# Patient Record
Sex: Female | Born: 1965 | Race: Black or African American | Hispanic: No | Marital: Married | Smoking: Never smoker
Health system: Southern US, Community
[De-identification: ages and names within clinical notes are randomized; demographics above are authoritative.]

## PROBLEM LIST (undated history)

## (undated) DIAGNOSIS — I10 Essential (primary) hypertension: Secondary | ICD-10-CM

## (undated) DIAGNOSIS — N920 Excessive and frequent menstruation with regular cycle: Secondary | ICD-10-CM

## (undated) DIAGNOSIS — D219 Benign neoplasm of connective and other soft tissue, unspecified: Secondary | ICD-10-CM

## (undated) DIAGNOSIS — L309 Dermatitis, unspecified: Secondary | ICD-10-CM

## (undated) HISTORY — PX: ANKLE SURGERY: SHX546

## (undated) HISTORY — DX: Benign neoplasm of connective and other soft tissue, unspecified: D21.9

## (undated) HISTORY — PX: BACK SURGERY: SHX140

## (undated) HISTORY — DX: Excessive and frequent menstruation with regular cycle: N92.0

## (undated) HISTORY — PX: TUBAL LIGATION: SHX77

## (undated) HISTORY — PX: KNEE SURGERY: SHX244

## (undated) HISTORY — DX: Dermatitis, unspecified: L30.9

---

## 2002-02-25 ENCOUNTER — Other Ambulatory Visit: Admission: RE | Admit: 2002-02-25 | Discharge: 2002-02-25 | Payer: Self-pay | Admitting: *Deleted

## 2002-05-23 ENCOUNTER — Ambulatory Visit (HOSPITAL_COMMUNITY): Admission: RE | Admit: 2002-05-23 | Discharge: 2002-05-23 | Payer: Self-pay | Admitting: *Deleted

## 2002-05-23 ENCOUNTER — Encounter: Payer: Self-pay | Admitting: *Deleted

## 2004-12-26 ENCOUNTER — Inpatient Hospital Stay (HOSPITAL_COMMUNITY): Admission: AD | Admit: 2004-12-26 | Discharge: 2004-12-29 | Payer: Self-pay | Admitting: *Deleted

## 2004-12-27 ENCOUNTER — Encounter (INDEPENDENT_AMBULATORY_CARE_PROVIDER_SITE_OTHER): Payer: Self-pay | Admitting: *Deleted

## 2005-01-30 ENCOUNTER — Ambulatory Visit (HOSPITAL_COMMUNITY): Admission: RE | Admit: 2005-01-30 | Discharge: 2005-01-30 | Payer: Self-pay | Admitting: *Deleted

## 2005-09-02 ENCOUNTER — Encounter: Admission: RE | Admit: 2005-09-02 | Discharge: 2005-09-02 | Payer: Self-pay | Admitting: Occupational Medicine

## 2008-10-23 ENCOUNTER — Inpatient Hospital Stay (HOSPITAL_COMMUNITY): Admission: RE | Admit: 2008-10-23 | Discharge: 2008-10-24 | Payer: Self-pay | Admitting: Orthopedic Surgery

## 2008-11-02 ENCOUNTER — Ambulatory Visit: Payer: Self-pay | Admitting: Family Medicine

## 2008-11-02 DIAGNOSIS — I10 Essential (primary) hypertension: Secondary | ICD-10-CM | POA: Insufficient documentation

## 2008-11-02 DIAGNOSIS — M1710 Unilateral primary osteoarthritis, unspecified knee: Secondary | ICD-10-CM

## 2008-11-02 DIAGNOSIS — J309 Allergic rhinitis, unspecified: Secondary | ICD-10-CM | POA: Insufficient documentation

## 2008-12-05 ENCOUNTER — Ambulatory Visit: Payer: Self-pay | Admitting: Family Medicine

## 2009-01-18 ENCOUNTER — Ambulatory Visit: Payer: Self-pay | Admitting: Family Medicine

## 2009-01-18 ENCOUNTER — Encounter (INDEPENDENT_AMBULATORY_CARE_PROVIDER_SITE_OTHER): Payer: Self-pay | Admitting: Family Medicine

## 2009-01-18 ENCOUNTER — Other Ambulatory Visit: Admission: RE | Admit: 2009-01-18 | Discharge: 2009-01-18 | Payer: Self-pay | Admitting: Family Medicine

## 2009-01-22 ENCOUNTER — Encounter (INDEPENDENT_AMBULATORY_CARE_PROVIDER_SITE_OTHER): Payer: Self-pay | Admitting: *Deleted

## 2009-01-24 ENCOUNTER — Ambulatory Visit (HOSPITAL_COMMUNITY): Admission: RE | Admit: 2009-01-24 | Discharge: 2009-01-24 | Payer: Self-pay | Admitting: Family Medicine

## 2009-02-01 ENCOUNTER — Encounter (INDEPENDENT_AMBULATORY_CARE_PROVIDER_SITE_OTHER): Payer: Self-pay | Admitting: Family Medicine

## 2009-02-08 ENCOUNTER — Ambulatory Visit (HOSPITAL_COMMUNITY): Admission: RE | Admit: 2009-02-08 | Discharge: 2009-02-08 | Payer: Self-pay | Admitting: Family Medicine

## 2009-02-16 ENCOUNTER — Encounter (INDEPENDENT_AMBULATORY_CARE_PROVIDER_SITE_OTHER): Payer: Self-pay | Admitting: Family Medicine

## 2009-02-23 ENCOUNTER — Ambulatory Visit: Payer: Self-pay | Admitting: Family Medicine

## 2009-02-24 ENCOUNTER — Encounter (INDEPENDENT_AMBULATORY_CARE_PROVIDER_SITE_OTHER): Payer: Self-pay | Admitting: Family Medicine

## 2009-02-26 ENCOUNTER — Telehealth (INDEPENDENT_AMBULATORY_CARE_PROVIDER_SITE_OTHER): Payer: Self-pay | Admitting: *Deleted

## 2009-02-26 ENCOUNTER — Encounter (INDEPENDENT_AMBULATORY_CARE_PROVIDER_SITE_OTHER): Payer: Self-pay | Admitting: Family Medicine

## 2009-02-26 LAB — CONVERTED CEMR LAB
BUN: 14 mg/dL (ref 6–23)
Basophils Relative: 1 % (ref 0–1)
CO2: 21 meq/L (ref 19–32)
Cholesterol: 187 mg/dL (ref 0–200)
Creatinine, Ser: 0.78 mg/dL (ref 0.40–1.20)
Eosinophils Relative: 6 % — ABNORMAL HIGH (ref 0–5)
Glucose, Bld: 91 mg/dL (ref 70–99)
HCT: 34.8 % — ABNORMAL LOW (ref 36.0–46.0)
HDL: 73 mg/dL (ref >39)
Iron: 25 ug/dL — ABNORMAL LOW (ref 42–145)
LDL Cholesterol: 102 mg/dL — ABNORMAL HIGH (ref 0–99)
Lymphocytes Relative: 24 % (ref 12–46)
MCHC: 32.2 g/dL (ref 30.0–36.0)
MCV: 76.3 fL — ABNORMAL LOW (ref 78.0–100.0)
Monocytes Absolute: 0.5 10*3/uL (ref 0.1–1.0)
Neutro Abs: 4 10*3/uL (ref 1.7–7.7)
RBC: 4.56 M/uL (ref 3.87–5.11)
Retic Ct Pct: 0.8 % (ref 0.4–3.1)
Sodium: 138 meq/L (ref 135–145)
TSH: 6.273 microintl units/mL — ABNORMAL HIGH (ref 0.350–4.500)
UIBC: 407 ug/dL
VLDL: 12 mg/dL (ref 0–40)
WBC: 6.4 10*3/uL (ref 4.0–10.5)

## 2009-03-01 ENCOUNTER — Encounter (INDEPENDENT_AMBULATORY_CARE_PROVIDER_SITE_OTHER): Payer: Self-pay | Admitting: *Deleted

## 2009-03-06 ENCOUNTER — Ambulatory Visit: Payer: Self-pay | Admitting: Family Medicine

## 2009-03-06 DIAGNOSIS — K5900 Constipation, unspecified: Secondary | ICD-10-CM | POA: Insufficient documentation

## 2009-03-06 DIAGNOSIS — E079 Disorder of thyroid, unspecified: Secondary | ICD-10-CM | POA: Insufficient documentation

## 2009-03-06 DIAGNOSIS — D509 Iron deficiency anemia, unspecified: Secondary | ICD-10-CM

## 2009-03-06 LAB — CONVERTED CEMR LAB: Cholesterol, target level: 200 mg/dL

## 2009-03-07 ENCOUNTER — Encounter (INDEPENDENT_AMBULATORY_CARE_PROVIDER_SITE_OTHER): Payer: Self-pay | Admitting: Family Medicine

## 2009-03-07 LAB — CONVERTED CEMR LAB
Free T4: 0.86 ng/dL (ref 0.80–1.80)
T3, Total: 80.2 ng/dL (ref 80.0–204.0)
T4, Total: 7 ug/dL (ref 5.0–12.5)
TSH: 5.505 microintl units/mL — ABNORMAL HIGH (ref 0.350–4.500)

## 2009-03-09 ENCOUNTER — Encounter (INDEPENDENT_AMBULATORY_CARE_PROVIDER_SITE_OTHER): Payer: Self-pay | Admitting: *Deleted

## 2009-04-23 ENCOUNTER — Ambulatory Visit: Payer: Self-pay | Admitting: Family Medicine

## 2009-04-25 ENCOUNTER — Telehealth (INDEPENDENT_AMBULATORY_CARE_PROVIDER_SITE_OTHER): Payer: Self-pay | Admitting: Family Medicine

## 2009-05-05 ENCOUNTER — Emergency Department (HOSPITAL_COMMUNITY): Admission: EM | Admit: 2009-05-05 | Discharge: 2009-05-05 | Payer: Self-pay | Admitting: Emergency Medicine

## 2009-06-26 ENCOUNTER — Ambulatory Visit: Payer: Self-pay | Admitting: Family Medicine

## 2009-06-26 DIAGNOSIS — J45909 Unspecified asthma, uncomplicated: Secondary | ICD-10-CM | POA: Insufficient documentation

## 2009-06-26 DIAGNOSIS — R21 Rash and other nonspecific skin eruption: Secondary | ICD-10-CM | POA: Insufficient documentation

## 2009-06-27 ENCOUNTER — Encounter (INDEPENDENT_AMBULATORY_CARE_PROVIDER_SITE_OTHER): Payer: Self-pay | Admitting: Family Medicine

## 2009-06-27 LAB — CONVERTED CEMR LAB
BUN: 14 mg/dL (ref 6–23)
CO2: 23 meq/L (ref 19–32)
Calcium: 9.3 mg/dL (ref 8.4–10.5)
Chloride: 103 meq/L (ref 96–112)
Creatinine, Ser: 0.98 mg/dL (ref 0.40–1.20)
Lymphocytes Relative: 25 % (ref 12–46)
Lymphs Abs: 2 10*3/uL (ref 0.7–4.0)
MCV: 85.5 fL (ref 78.0–100.0)
Monocytes Relative: 7 % (ref 3–12)
Neutro Abs: 4.7 10*3/uL (ref 1.7–7.7)
RBC: 4.75 M/uL (ref 3.87–5.11)
RDW: 15 % (ref 11.5–15.5)

## 2009-06-28 LAB — CONVERTED CEMR LAB: Free T4: 0.94 ng/dL (ref 0.80–1.80)

## 2009-11-19 ENCOUNTER — Emergency Department (HOSPITAL_COMMUNITY): Admission: EM | Admit: 2009-11-19 | Discharge: 2009-11-19 | Payer: Self-pay | Admitting: Emergency Medicine

## 2009-11-21 ENCOUNTER — Encounter: Payer: Self-pay | Admitting: Physician Assistant

## 2009-12-31 ENCOUNTER — Ambulatory Visit: Payer: Self-pay | Admitting: Family Medicine

## 2009-12-31 DIAGNOSIS — L309 Dermatitis, unspecified: Secondary | ICD-10-CM

## 2010-01-18 ENCOUNTER — Ambulatory Visit: Payer: Self-pay | Admitting: Family Medicine

## 2010-01-18 DIAGNOSIS — M5430 Sciatica, unspecified side: Secondary | ICD-10-CM | POA: Insufficient documentation

## 2010-01-24 ENCOUNTER — Ambulatory Visit: Payer: Self-pay | Admitting: Family Medicine

## 2010-01-24 ENCOUNTER — Encounter: Payer: Self-pay | Admitting: Physician Assistant

## 2010-01-28 ENCOUNTER — Emergency Department (HOSPITAL_COMMUNITY): Admission: EM | Admit: 2010-01-28 | Discharge: 2010-01-29 | Payer: Self-pay | Admitting: Emergency Medicine

## 2010-01-28 ENCOUNTER — Telehealth (INDEPENDENT_AMBULATORY_CARE_PROVIDER_SITE_OTHER): Payer: Self-pay | Admitting: *Deleted

## 2010-01-29 ENCOUNTER — Ambulatory Visit (HOSPITAL_COMMUNITY): Admission: RE | Admit: 2010-01-29 | Discharge: 2010-01-29 | Payer: Self-pay | Admitting: Family Medicine

## 2010-01-29 ENCOUNTER — Encounter: Payer: Self-pay | Admitting: Physician Assistant

## 2010-01-29 ENCOUNTER — Ambulatory Visit: Payer: Self-pay | Admitting: Family Medicine

## 2010-01-29 DIAGNOSIS — M5126 Other intervertebral disc displacement, lumbar region: Secondary | ICD-10-CM

## 2010-01-30 ENCOUNTER — Telehealth: Payer: Self-pay | Admitting: Family Medicine

## 2010-01-30 ENCOUNTER — Encounter: Payer: Self-pay | Admitting: Physician Assistant

## 2010-02-05 ENCOUNTER — Ambulatory Visit (HOSPITAL_COMMUNITY): Admission: RE | Admit: 2010-02-05 | Discharge: 2010-02-06 | Payer: Self-pay | Admitting: Neurological Surgery

## 2010-02-13 ENCOUNTER — Encounter: Payer: Self-pay | Admitting: Physician Assistant

## 2010-02-20 ENCOUNTER — Ambulatory Visit (HOSPITAL_COMMUNITY): Admission: RE | Admit: 2010-02-20 | Discharge: 2010-02-20 | Payer: Self-pay | Admitting: Family Medicine

## 2010-02-26 ENCOUNTER — Encounter: Payer: Self-pay | Admitting: Physician Assistant

## 2010-03-04 ENCOUNTER — Telehealth: Payer: Self-pay | Admitting: Physician Assistant

## 2010-03-29 ENCOUNTER — Encounter: Payer: Self-pay | Admitting: Physician Assistant

## 2010-04-11 ENCOUNTER — Telehealth: Payer: Self-pay | Admitting: Family Medicine

## 2010-05-13 ENCOUNTER — Telehealth: Payer: Self-pay | Admitting: Family Medicine

## 2010-05-14 ENCOUNTER — Ambulatory Visit: Payer: Self-pay | Admitting: Family Medicine

## 2010-05-31 ENCOUNTER — Telehealth: Payer: Self-pay | Admitting: Family Medicine

## 2010-06-03 ENCOUNTER — Ambulatory Visit: Payer: Self-pay | Admitting: Family Medicine

## 2010-06-03 DIAGNOSIS — R05 Cough: Secondary | ICD-10-CM

## 2010-06-12 ENCOUNTER — Telehealth: Payer: Self-pay | Admitting: Family Medicine

## 2010-07-15 ENCOUNTER — Telehealth: Payer: Self-pay | Admitting: Family Medicine

## 2010-08-12 ENCOUNTER — Telehealth: Payer: Self-pay | Admitting: Family Medicine

## 2010-08-13 ENCOUNTER — Telehealth (INDEPENDENT_AMBULATORY_CARE_PROVIDER_SITE_OTHER): Payer: Self-pay | Admitting: *Deleted

## 2010-08-14 ENCOUNTER — Ambulatory Visit: Payer: Self-pay | Admitting: Family Medicine

## 2010-09-12 ENCOUNTER — Telehealth: Payer: Self-pay | Admitting: Family Medicine

## 2010-09-19 ENCOUNTER — Telehealth: Payer: Self-pay | Admitting: Family Medicine

## 2010-10-10 ENCOUNTER — Telehealth: Payer: Self-pay | Admitting: Family Medicine

## 2010-10-24 LAB — CONVERTED CEMR LAB: OCCULT 1: NEGATIVE

## 2010-10-28 ENCOUNTER — Encounter: Payer: Self-pay | Admitting: Family Medicine

## 2010-10-28 ENCOUNTER — Ambulatory Visit
Admission: RE | Admit: 2010-10-28 | Discharge: 2010-10-28 | Payer: Self-pay | Source: Home / Self Care | Attending: Family Medicine | Admitting: Family Medicine

## 2010-10-28 ENCOUNTER — Other Ambulatory Visit: Payer: Self-pay | Admitting: Family Medicine

## 2010-10-28 ENCOUNTER — Other Ambulatory Visit
Admission: RE | Admit: 2010-10-28 | Discharge: 2010-10-28 | Payer: Self-pay | Source: Home / Self Care | Admitting: Family Medicine

## 2010-10-28 ENCOUNTER — Encounter (INDEPENDENT_AMBULATORY_CARE_PROVIDER_SITE_OTHER): Payer: Self-pay

## 2010-10-28 DIAGNOSIS — J019 Acute sinusitis, unspecified: Secondary | ICD-10-CM | POA: Insufficient documentation

## 2010-10-30 ENCOUNTER — Encounter: Payer: Self-pay | Admitting: Family Medicine

## 2010-10-30 DIAGNOSIS — E039 Hypothyroidism, unspecified: Secondary | ICD-10-CM | POA: Insufficient documentation

## 2010-10-30 LAB — CONVERTED CEMR LAB
BUN: 14 mg/dL (ref 6–23)
Basophils Absolute: 0.1 10*3/uL (ref 0.0–0.1)
Basophils Relative: 1 % (ref 0–1)
Creatinine, Ser: 0.89 mg/dL (ref 0.40–1.20)
Eosinophils Absolute: 0.6 10*3/uL (ref 0.0–0.7)
Glucose, Bld: 77 mg/dL (ref 70–99)
HCT: 40.5 % (ref 36.0–46.0)
Lymphocytes Relative: 15 % (ref 12–46)
MCV: 82.8 fL (ref 78.0–100.0)
Monocytes Absolute: 0.9 10*3/uL (ref 0.1–1.0)
Monocytes Relative: 8 % (ref 3–12)
Neutrophils Relative %: 71 % (ref 43–77)
RDW: 15.2 % (ref 11.5–15.5)
Triglycerides: 69 mg/dL (ref <150)
WBC: 11 10*3/uL — ABNORMAL HIGH (ref 4.0–10.5)

## 2010-10-31 ENCOUNTER — Telehealth: Payer: Self-pay | Admitting: Family Medicine

## 2010-10-31 ENCOUNTER — Encounter: Payer: Self-pay | Admitting: Family Medicine

## 2010-11-04 ENCOUNTER — Ambulatory Visit
Admission: RE | Admit: 2010-11-04 | Discharge: 2010-11-04 | Payer: Self-pay | Source: Home / Self Care | Attending: Family Medicine | Admitting: Family Medicine

## 2010-11-04 LAB — CONVERTED CEMR LAB
Free T4: 1.02 ng/dL (ref 0.80–1.80)
T3, Free: 3 pg/mL (ref 2.3–4.2)

## 2010-11-05 ENCOUNTER — Telehealth: Payer: Self-pay | Admitting: Family Medicine

## 2010-11-05 ENCOUNTER — Encounter: Payer: Self-pay | Admitting: Family Medicine

## 2010-11-07 ENCOUNTER — Telehealth: Payer: Self-pay | Admitting: Family Medicine

## 2010-11-07 ENCOUNTER — Ambulatory Visit (HOSPITAL_COMMUNITY)
Admission: RE | Admit: 2010-11-07 | Discharge: 2010-11-07 | Payer: Self-pay | Source: Home / Self Care | Attending: Family Medicine | Admitting: Family Medicine

## 2010-11-11 ENCOUNTER — Emergency Department (HOSPITAL_COMMUNITY)
Admission: EM | Admit: 2010-11-11 | Discharge: 2010-11-12 | Payer: Self-pay | Source: Home / Self Care | Admitting: Emergency Medicine

## 2010-11-11 DIAGNOSIS — R5383 Other fatigue: Secondary | ICD-10-CM

## 2010-11-11 DIAGNOSIS — R5381 Other malaise: Secondary | ICD-10-CM | POA: Insufficient documentation

## 2010-11-11 LAB — URINALYSIS, ROUTINE W REFLEX MICROSCOPIC
Nitrite: NEGATIVE
Specific Gravity, Urine: >1.03 — ABNORMAL HIGH (ref 1.005–1.030)
Urobilinogen, UA: 0.2 mg/dL (ref 0.0–1.0)
pH: 5 (ref 5.0–8.0)

## 2010-11-11 LAB — POCT CARDIAC MARKERS
CKMB, poc: 12.3 ng/mL (ref 1.0–8.0)
Troponin i, poc: <0.05 ng/mL (ref 0.00–0.09)

## 2010-11-11 LAB — CBC
Hemoglobin: 11.4 g/dL — ABNORMAL LOW (ref 12.0–15.0)
Platelets: 267 10*3/uL (ref 150–400)
WBC: 14.6 10*3/uL — ABNORMAL HIGH (ref 4.0–10.5)

## 2010-11-11 LAB — URINE MICROSCOPIC-ADD ON

## 2010-11-11 LAB — POCT PREGNANCY, URINE: Preg Test, Ur: NEGATIVE

## 2010-11-12 ENCOUNTER — Ambulatory Visit
Admission: RE | Admit: 2010-11-12 | Discharge: 2010-11-12 | Payer: Self-pay | Source: Home / Self Care | Attending: Family Medicine | Admitting: Family Medicine

## 2010-11-12 ENCOUNTER — Encounter: Payer: Self-pay | Admitting: Family Medicine

## 2010-11-12 ENCOUNTER — Encounter (INDEPENDENT_AMBULATORY_CARE_PROVIDER_SITE_OTHER): Payer: Self-pay

## 2010-11-12 DIAGNOSIS — R079 Chest pain, unspecified: Secondary | ICD-10-CM | POA: Insufficient documentation

## 2010-11-12 DIAGNOSIS — K279 Peptic ulcer, site unspecified, unspecified as acute or chronic, without hemorrhage or perforation: Secondary | ICD-10-CM | POA: Insufficient documentation

## 2010-11-12 LAB — LIPASE, BLOOD: Lipase: 20 U/L (ref 11–59)

## 2010-11-12 LAB — COMPREHENSIVE METABOLIC PANEL
ALT: 93 U/L — ABNORMAL HIGH (ref 0–35)
Albumin: 3.3 g/dL — ABNORMAL LOW (ref 3.5–5.2)
BUN: 11 mg/dL (ref 6–23)
Chloride: 105 mEq/L (ref 96–112)
GFR calc Af Amer: >60 mL/min (ref >60)
GFR calc non Af Amer: >60 mL/min (ref >60)
Potassium: 3.2 mEq/L — ABNORMAL LOW (ref 3.5–5.1)
Sodium: 138 mEq/L (ref 135–145)
Total Protein: 6.5 g/dL (ref 6.0–8.3)

## 2010-11-12 LAB — CK TOTAL AND CKMB (NOT AT ARMC)
CK, MB: 19.4 ng/mL (ref 0.3–4.0)
CK, MB: 15.6 ng/mL (ref 0.3–4.0)
Relative Index: 5.9 — ABNORMAL HIGH (ref 0.0–2.5)
Relative Index: 6 — ABNORMAL HIGH (ref 0.0–2.5)
Total CK: 331 U/L — ABNORMAL HIGH (ref 7–177)

## 2010-11-12 LAB — TROPONIN I

## 2010-11-12 NOTE — Progress Notes (Signed)
Summary: COUGH AND INHALER  Phone Note Call from Patient   Summary of Call: CALL HER AT [phone] COUGH AND A INHALER Initial call taken by: Lind Guest,  May 31, 2010 9:07 AM  Follow-up for Phone Call        RETURNED CALL, LEFT MESSAGE Follow-up by: Adella Hare LPN,  May 31, 2010 11:47 AM    Prescriptions: VENTOLIN HFA 108 (90 BASE) MCG/ACT AERS (ALBUTEROL SULFATE) use 2 puffs every 4 hrs as needed  #2 x 0   Entered by:   Adella Hare LPN   Authorized by:   Syliva Overman MD   Signed by:   Adella Hare LPN on 16/07/9603   Method used:   Electronically to        [facility]  [address] (retail)       [address]       [place], [place]  [address]       Ph: [phone]       Fax: [phone]   RxID:   [id number]

## 2010-11-12 NOTE — Letter (Signed)
Summary: medical   release  medical   release   Imported By: Lind Guest 01/30/2010 13:13:14  _____________________________________________________________________  External Attachment:    Type:   Image     Comment:   External Document

## 2010-11-12 NOTE — Progress Notes (Signed)
Summary: MEDS  Phone Note Call from Patient   Summary of Call: OUT OF HER ALLEGERY MEDS NEEDS IT CALLED INTO [facility] CALL BACK AT [phone] AND LEAVE A MESSAGE WHEN DONE  Initial call taken by: Lind Guest,  April 11, 2010 11:15 AM    Prescriptions: CETIRIZINE HCL 10 MG TABS (CETIRIZINE HCL) one tab by mouth once daily  #30 x 0   Entered by:   Adella Hare LPN   Authorized by:   Syliva Overman MD   Signed by:   Adella Hare LPN on 60/63/0160   Method used:   Electronically to        [facility]  [address] (retail)       [address]       [place], [place]  [address]       Ph: [phone]       Fax: [phone]   RxID:   [id number]

## 2010-11-12 NOTE — Progress Notes (Signed)
Summary: refill  Phone Note Call from Patient   Summary of Call: needs a refill on allergy pilll [facility] [phone] Initial call taken by: Rudene Anda,  June 12, 2010 1:24 PM  Follow-up for Phone Call        Rx Called In Follow-up by: Adella Hare LPN,  June 13, 2010 9:09 AM    Prescriptions: CETIRIZINE HCL 10 MG TABS (CETIRIZINE HCL) one tab by mouth once daily  #30 x 0   Entered by:   Adella Hare LPN   Authorized by:   Syliva Overman MD   Signed by:   Adella Hare LPN on 14/78/2956   Method used:   Electronically to        [facility]  [address] (retail)       [address]       [place], [place]  [address]       Ph: [phone]       Fax: [phone]   RxID:   [id number]

## 2010-11-12 NOTE — Progress Notes (Signed)
Summary: FVC/ FVC REPORT  FVC/ FVC REPORT   Imported By: Lind Guest 01/23/2010 14:26:52  _____________________________________________________________________  External Attachment:    Type:   Image     Comment:   External Document

## 2010-11-12 NOTE — Progress Notes (Signed)
Summary: VANGUARD  VANGUARD   Imported By: Lind Guest 02/13/2010 11:24:14  _____________________________________________________________________  External Attachment:    Type:   Image     Comment:   External Document

## 2010-11-12 NOTE — Progress Notes (Signed)
Summary: medicine  Phone Note Call from Patient   Summary of Call: needs [facility] [place] she cancelled her appt. because of starting her period she rsc. for 1 day next week Initial call taken by: Lind Guest,  Mar 04, 2010 1:13 PM  Follow-up for Phone Call        Rx Called In Follow-up by: Adella Hare LPN,  Mar 04, 2010 1:19 PM    Prescriptions: CETIRIZINE HCL 10 MG TABS (CETIRIZINE HCL) one tab by mouth once daily  #30 x 0   Entered by:   Adella Hare LPN   Authorized by:   Esperanza Sheets PA   Signed by:   Adella Hare LPN on 40/98/1191   Method used:   Electronically to        [facility]  [address] (retail)       [address]       [place], [place]  [address]       Ph: [phone]       Fax: [phone]   RxID:   [id number]

## 2010-11-12 NOTE — Assessment & Plan Note (Signed)
Summary: NEW PATIENT   Vital Signs:  Patient profile:   45 year old female Height:      66 inches Weight:      186 pounds BMI:     30.13 O2 Sat:      99 % on Room air Pulse rate:   85 / minute Resp:     16 per minute BP sitting:   110 / 70  (left arm)  Vitals Entered By: Adella Hare LPN (December 31, 2009 2:08 PM) CC: new patient- room 1 Is Patient Diabetic? No Pain Assessment Patient in pain? no        Primary Provider:  Esperanza Sheets PA  CC:  new patient- room 1.  History of Present Illness: New pt.  Sinus infection couple wks ago.  Then had asthma attack. Was put on Prednisone, Sympbicort and Albuterol.  Not using either now.  Denies wheezing, difficulty breathing or hs syptoms.  She has a prev dx of asthma but rarely has sxs or uses albuterol. States she is very active.  Plays ball 3-4 days per wk without breathing probs.  Hx of eczema.  Has seen derm.  Uses steroid creams as needed. Hx of anemia.  Not taking any iron.  Hx of heavy menses. Usually regular, but last mos had 2 menses.  Taking her BP meds.  No complaints with.  No chest pain or palp.  Pt states she is overdue for her mamm. Last pap was 1 yr ago April.     Current Medications (verified): 1)  Amlodipine Besylate 5 Mg Tabs (Amlodipine Besylate) .... One Daily 2)  Cetirizine Hcl 10 Mg Tabs (Cetirizine Hcl) .... One Tab By Mouth Once Daily  Allergies (verified): 1)  ! Penicillin  Past History:  Past medical history reviewed for relevance to current acute and chronic problems.  Past Medical History: Reviewed history from 11/02/2008 and no changes required. Allergic rhinitis Hypertension  Review of Systems General:  Denies chills and fever. ENT:  Denies earache, nasal congestion, sinus pressure, and sore throat. CV:  Denies chest pain or discomfort and palpitations. Resp:  Denies cough, shortness of breath, and wheezing. GU:  Complains of abnormal vaginal bleeding. Derm:  Complains of  rash.  Physical Exam  General:  Well-developed,well-nourished,in no acute distress; alert,appropriate and cooperative throughout examination Head:  Normocephalic and atraumatic without obvious abnormalities. No apparent alopecia or balding. Ears:  External ear exam shows no significant lesions or deformities.  Otoscopic examination reveals clear canals, tympanic membranes are intact bilaterally without bulging, retraction, inflammation or discharge. Hearing is grossly normal bilaterally. Nose:  External nasal examination shows no deformity or inflammation. Nasal mucosa are pink and moist without lesions or exudates. Mouth:  Oral mucosa and oropharynx without lesions or exudates.  Teeth in good repair. Neck:  No deformities, masses, or tenderness noted. Lungs:  Normal respiratory effort, chest expands symmetrically. Lungs are clear to auscultation, no crackles or wheezes. Heart:  Normal rate and regular rhythm. S1 and S2 normal without gallop, murmur, click, rub or other extra sounds. Abdomen:  Bowel sounds positive,abdomen soft and non-tender without masses, organomegaly or hernias noted. Skin:  erythematous lesions noted ears, forhead & neck Cervical Nodes:  No lymphadenopathy noted Psych:  Cognition and judgment appear intact. Alert and cooperative with normal attention span and concentration. No apparent delusions, illusions, hallucinations   Impression & Recommendations:  Problem # 1:  HYPERTENSION (ICD-401.9) Assessment Improved  Her updated medication list for this problem includes:    Amlodipine Besylate  5 Mg Tabs (Amlodipine besylate) ..... One daily  Orders: T-Comprehensive Metabolic Panel [id number]) T-Lipid Profile [id number])  BP today: 110/70 Prior BP: 147/106 (06/26/2009)  Prior 10 Yr Risk Heart Disease: 3 % (06/26/2009)  Labs Reviewed: K+: 3.6 (06/27/2009) Creat: : 0.98 (06/27/2009)   Chol: 187 (02/24/2009)   HDL: 73 (02/24/2009)   LDL: 102 (02/24/2009)   TG:  58 (02/24/2009)  Problem # 2:  ANEMIA, IRON DEFICIENCY (ICD-280.9)  The following medications were removed from the medication list:    Ferrous Sulfate 325 (65 Fe) Mg Tbec (Ferrous sulfate) ..... One two times a day  Orders: T-CBC No Diff ([id number]) T-Iron [id number]) T-Iron Binding Capacity (TIBC) ([id number])  Hgb: 12.9 (06/27/2009)   Hct: 40.6 (06/27/2009)   Platelets: 326 (06/27/2009) RBC: 4.75 (06/27/2009)   RDW: 15.0 (06/27/2009)   WBC: 8.0 (06/27/2009) MCV: 85.5 (06/27/2009)   MCHC: 31.8 (06/27/2009) Retic Ct: 36.3 K/uL (02/26/2009)   Ferritin: 7 (02/26/2009) Iron: 25 (02/26/2009)   TIBC: 432 (02/26/2009)   % Sat: 6 (02/26/2009) TSH: 8.857 (06/27/2009)  Problem # 3:  ASTHMA (ICD-493.90) Assessment: Improved Hx of recent asthma attack. Has been very well controlled.  No rescue albuterol use. Has had recent PFT.  Will obtain records.  The following medications were removed from the medication list:    Ventolin Hfa 108 (90 Base) Mcg/act Aers (Albuterol sulfate) .[name]... 2 puffs every 6 hours as needed  Problem # 4:  ECZEMA (ICD-692.9)  The following medications were removed from the medication list:    Claritin 10 Mg Caps (Loratadine) ..... One daily    Triamcinolone Acetonide 0.5 % Crea (Triamcinolone acetonide) .[name]... Apply to rash two times a day for 10 days Her updated medication list for this problem includes:    Cetirizine Hcl 10 Mg Tabs (Cetirizine hcl) ..... One tab by mouth once daily  Complete Medication List: 1)  Amlodipine Besylate 5 Mg Tabs (Amlodipine besylate) .... One daily 2)  Cetirizine Hcl 10 Mg Tabs (Cetirizine hcl) .... One tab by mouth once daily  Other Orders: T-TSH [id number]) T-Vitamin D (25-Hydroxy) [id number]) T-T4, Free [id number]) Misc. Referral (Misc. Ref)  Patient Instructions: 1)  Schedule an appt in 4-6 weeks for physical/pap. 2)  It is important that you exercise regularly at least 20 minutes 5 times a week. If you develop  chest pain, have severe difficulty breathing, or feel very tired , stop exercising immediately and seek medical attention. 3)  You need to lose weight. Consider a lower calorie diet and regular exercise.  4)  Have lab work done before your next appt.

## 2010-11-12 NOTE — Progress Notes (Signed)
Summary: bak pain  Phone Note Call from Patient   Summary of Call: pt can't move her back. sharp pains in back. please call her. pt is also sweating and nausea and feet numb [phone] Initial call taken by: Rudene Anda,  January 28, 2010 4:18 PM  Follow-up for Phone Call        returned call x 2- busy signal Follow-up by: Adella Hare LPN,  January 28, 2010 5:19 PM  Additional Follow-up for Phone Call Additional follow up Details #1::        if patient calls back offer ov with dawn, tried to return call, line was busy Additional Follow-up by: Adella Hare LPN,  January 28, 2010 5:30 PM    Additional Follow-up for Phone Call Additional follow up Details #2::    COMING IN TODAY Follow-up by: Lind Guest,  January 29, 2010 8:41 AM

## 2010-11-12 NOTE — Assessment & Plan Note (Signed)
Summary: hopital follow up- room 2   Vital Signs:  Patient profile:   45 year old female Height:      66 inches Weight:      179.25 pounds BMI:     29.04 O2 Sat:      97 % on Room air Pulse rate:   91 / minute Resp:     16 per minute BP sitting:   140 / 90  (left arm)  Vitals Entered By: Adella Hare LPN (January 29, 2010 1:12 PM) CC: back pain Is Patient Diabetic? No Pain Assessment Patient in pain? yes     Location: back Intensity: 10 Type: aching Onset of pain  Constant   Primary Provider:  Esperanza Sheets PA  CC:  back pain.  History of Present Illness: Pt is here today due to increasing LBP, with radiation LLE & numbness of Lt foot.  She had an MRI earlier today.  She was at the ER last night due to increased pain that was not improved with her current prescription.  She states the Percocet that they gave her in ER worked better for her pain.  Pt also states she needs a refill of her allergy meds (Zyrtec).  She also has been having a flare up of her eczema.  She states the cream Dr Erby Pian prescribed her worked better than the creams that she has gotten from derm in the past.   Current Medications (verified): 1)  Amlodipine Besylate 5 Mg Tabs (Amlodipine Besylate) .... One Daily 2)  Cetirizine Hcl 10 Mg Tabs (Cetirizine Hcl) .... One Tab By Mouth Once Daily 3)  Naprosyn 500 Mg Tabs (Naproxen) .... Take 1 Two Times A Day As Needed For Pain 4)  Tramadol Hcl 50 Mg Tabs (Tramadol Hcl) .... Take 1 -2 Every 6 Hrs As Needed For Pain  Allergies (verified): 1)  ! Penicillin  Past History:  Past medical history reviewed for relevance to current acute and chronic problems.  Past Medical History: Reviewed history from 11/02/2008 and no changes required. Allergic rhinitis Hypertension  Review of Systems General:  Denies chills and fever. ENT:  Complains of nasal congestion. MS:  Complains of low back pain. Neuro:  Complains of numbness and tingling. Allergy:  Complains  of seasonal allergies and sneezing.  Physical Exam  General:  Well-developed,well-nourished,in no acute distress; alert,appropriate and cooperative throughout examination Head:  Normocephalic and atraumatic without obvious abnormalities. No apparent alopecia or balding. Ears:  External ear exam shows no significant lesions or deformities.  Otoscopic examination reveals clear canals, tympanic membranes are intact bilaterally without bulging, retraction, inflammation or discharge. Hearing is grossly normal bilaterally. Nose:  no external deformity, no nasal discharge, no mucosal pallor, and mucosal edema.  no external deformity, no nasal discharge, no mucosal pallor, and mucosal edema.   Mouth:  Oral mucosa and oropharynx without lesions or exudates.  Teeth in good repair. Neck:  No deformities, masses, or tenderness noted. Lungs:  Normal respiratory effort, chest expands symmetrically. Lungs are clear to auscultation, no crackles or wheezes. Heart:  Normal rate and regular rhythm. S1 and S2 normal without gallop, murmur, click, rub or other extra sounds. Msk:  TTP Lt sciatic notch.  Pt changes positions slowly due to pain.  Also noted limp with gait. Neurologic:  alert & oriented X3 and sensation intact to light touch.  alert & oriented X3 and sensation intact to light touch.   Skin:  erythematous patches noted, & areas of excoriation bilat ears, neck & forehead.  Cervical Nodes:  No lymphadenopathy noted Psych:  Cognition and judgment appear intact. Alert and cooperative with normal attention span and concentration. 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 Off work note x 2 weeks. May need to be extended by neurosurg.  Work forms completed.  Problem # 2:  SCIATICA, LEFT (ICD-724.3) Assessment: Deteriorated  Her updated medication list  for this problem includes:    Naprosyn 500 Mg Tabs (Naproxen) .[name]... Take 1 two times a day as needed for pain    Tramadol Hcl 50 Mg Tabs (Tramadol hcl) .[name]... Take 1 -2 every 6 hrs as needed for pain    Vicodin Es 7.5-750 Mg Tabs (Hydrocodone-acetaminophen) .[name]... Take 1 tab every 6 hrs as needed for back pain  Her updated medication list for this problem includes:    Naprosyn 500 Mg Tabs (Naproxen) .[name]... Take 1 two times a day as needed for pain    Tramadol Hcl 50 Mg Tabs (Tramadol hcl) .[name]... Take 1 -2 every 6 hrs as needed for pain    Vicodin Es 7.5-750 Mg Tabs (Hydrocodone-acetaminophen) .[name]... Take 1 tab every 6 hrs as needed for back pain  Problem # 3:  ALLERGIC RHINITIS (ICD-477.9) Assessment: Deteriorated  Her updated medication list for this problem includes:    Cetirizine Hcl 10 Mg Tabs (Cetirizine hcl) ..... One tab by mouth once daily  Her updated medication list for this problem includes:    Cetirizine Hcl 10 Mg Tabs (Cetirizine hcl) ..... One tab by mouth once daily  Problem # 4:  ECZEMA (ICD-692.9) Assessment: Deteriorated  Her updated medication list for this problem includes:    Cetirizine Hcl 10 Mg Tabs (Cetirizine hcl) ..... One tab by mouth once daily    Triamcinolone Acetonide 0.5 % Crea (Triamcinolone acetonide) .[name]... Apply two times a day as needed to eczema  Her updated medication list for this problem includes:    Cetirizine Hcl 10 Mg Tabs (Cetirizine hcl) ..... One tab by mouth once daily    Triamcinolone Acetonide 0.5 % Crea (Triamcinolone acetonide) .[name]... Apply two times a day as needed to eczema  Complete Medication List: 1)  Amlodipine Besylate 5 Mg Tabs (Amlodipine besylate) .... One daily 2)  Cetirizine Hcl 10 Mg Tabs (Cetirizine hcl) .... One tab by mouth once daily 3)  Naprosyn 500 Mg Tabs (Naproxen) .... Take 1 two times a day as needed for pain 4)  Tramadol Hcl 50 Mg Tabs (Tramadol hcl) .... Take 1 -2 every 6 hrs as needed for pain 5)  Vicodin Es  7.5-750 Mg Tabs (Hydrocodone-acetaminophen) .... Take 1 tab every 6 hrs as needed for back pain 6)  Triamcinolone Acetonide 0.5 % Crea (Triamcinolone acetonide) .... Apply two times a day as needed to eczema  Patient Instructions: 1)  Please schedule a follow-up appointment as needed. 2)  I have referred you to a neurosurgeon for your back pain. 3)  I have prescribed Vicoden ES for your back pain. 4)  I am taking you out of work x 2 weeks.  Hopefully you will see the neurosugeon by then. 5)  I have refilled your allergy medication & eczema cream also. Prescriptions: TRIAMCINOLONE ACETONIDE 0.5 % CREA (TRIAMCINOLONE ACETONIDE) apply two times a day as needed to eczema  #60 gm x 1   Entered and Authorized by:   Esperanza Sheets PA   Signed by:   Esperanza Sheets  PA on 01/29/2010   Method used:   Electronically to        [facility]  [address] (retail)       [address]       [place], [place]  [address]       Ph: [phone]       Fax: [phone]   RxID:   [id number] VICODIN ES 7.5-750 MG TABS (HYDROCODONE-ACETAMINOPHEN) take 1 tab every 6 hrs as needed for back pain  #60 x 0   Entered and Authorized by:   Esperanza Sheets PA   Signed by:   Esperanza Sheets PA on 01/29/2010   Method used:   Printed then faxed to ...       [facility]  [address] (retail)       [address]       [place], [place]  [address]       Ph: [phone]       Fax: [phone]   RxID:   [id number] CETIRIZINE HCL 10 MG TABS (CETIRIZINE HCL) one tab by mouth once daily  #30 x 5   Entered and Authorized by:   Esperanza Sheets PA   Signed by:   Esperanza Sheets PA on 01/29/2010   Method used:   Electronically to        [facility]  [address] (retail)       [address]       [place], [place]  [address]       Ph: [phone]       Fax: [phone]   RxID:   [id number]

## 2010-11-12 NOTE — Progress Notes (Signed)
  Phone Note Call from Patient   Caller: Patient Summary of Call: patient states she could not get allergy med until this morning, patient had to leave work early because she could not breathe, can we give her a work note for leaving early? cell [phone] Initial call taken by: Adella Hare LPN,  August 13, 2010 11:25 AM  Follow-up for Phone Call        sounds as if she needfs ov pls see if she can be workled in tomorrow, i cannot give work note without eval,  Follow-up by: Syliva Overman MD,  August 13, 2010 12:43 PM  Additional Follow-up for Phone Call Additional follow up Details #1::        LEFT MESSAGE Additional Follow-up by: Lind Guest,  August 13, 2010 1:06 PM    Additional Follow-up for Phone Call Additional follow up Details #2::    patient called back I added her to the schedule at 2. Follow-up by: Curtis Sites,  August 13, 2010 2:23 PM

## 2010-11-12 NOTE — Assessment & Plan Note (Signed)
Summary: back pain- room 2   Vital Signs:  Patient profile:   45 year old female Height:      66 inches Weight:      184.50 pounds BMI:     29.89 O2 Sat:      98 % on Room air Pulse rate:   89 / minute Resp:     16 per minute BP sitting:   120 / 80  (left arm)  Vitals Entered By: Adella Hare LPN (January 24, 2010 3:51 PM) CC: follow up back pain Is Patient Diabetic? No Pain Assessment Patient in pain? yes     Location: back Intensity: 7 Type: sharp Onset of pain  Constant Comments reports pain radiates down left leg   Primary Provider:  Esperanza Sheets PA  CC:  follow up back pain.  History of Present Illness: Pt presents today for f/u of her  LBP.  See last wks visit. She is still having pain in her LLE, Lt buttock and low back.  She states that she is very stiff & sore in the mornings when she gets out of bed.   + numbness/ tingling LLE, especially in foot. No prev hx  of low back probs.  No injury, but had been doing a lot of heavy lifting at work prior to onset. Naproxen & Tramadol have been helping.  Has been able to work.  Supervisor has been making accomodations for her so she doesn't have to do any heavy lifting, but thinks she may need a note for this to continue.    Current Medications (verified): 1)  Amlodipine Besylate 5 Mg Tabs (Amlodipine Besylate) .... One Daily 2)  Cetirizine Hcl 10 Mg Tabs (Cetirizine Hcl) .... One Tab By Mouth Once Daily 3)  Naprosyn 500 Mg Tabs (Naproxen) .... Take 1 Two Times A Day As Needed For Pain 4)  Tramadol Hcl 50 Mg Tabs (Tramadol Hcl) .... Take 1 -2 Every 6 Hrs As Needed For Pain  Allergies (verified): 1)  ! Penicillin  Past History:  Past medical history reviewed for relevance to current acute and chronic problems.  Past Medical History: Reviewed history from 11/02/2008 and no changes required. Allergic rhinitis Hypertension  Review of Systems GI:  Complains of constipation. MS:  Complains of low back pain and  stiffness; denies joint pain and joint swelling. Neuro:  Complains of numbness and tingling.  Physical Exam  General:  Well-developed,well-nourished,in no acute distress; alert,appropriate and cooperative throughout examination Head:  Normocephalic and atraumatic without obvious abnormalities. No apparent alopecia or balding. Lungs:  Normal respiratory effort, chest expands symmetrically. Lungs are clear to auscultation, no crackles or wheezes. Heart:  Normal rate and regular rhythm. S1 and S2 normal without gallop, murmur, click, rub or other extra sounds. Msk:  TTP Lt L5 S1 area, Lt sciatic notch. FROM. Able to stand heels & toes. Extremities:  Decreased strenth noted today LLE  when compared to RLE. Patellar & Achilles DTR 1/4 LLE, 2/4 RLE Neurologic:  gait normal.   Skin:  Intact without suspicious lesions or rashes Psych:  Cognition and judgment appear intact. Alert and cooperative with normal attention span and concentration. No apparent delusions, illusions, hallucinations   Impression & Recommendations:  Problem # 1:  SCIATICA, LEFT (ICD-724.3) Assessment Improved Pain is improved while on medications, but returns as wears off.  Her updated medication list for this problem includes:    Naprosyn 500 Mg Tabs (Naproxen) .[name]... Take 1 two times a day as needed for pain  Tramadol Hcl 50 Mg Tabs (Tramadol hcl) .[name]... Take 1 -2 every 6 hrs as needed for pain  Orders: Miscellaneous Other Radiology (Misc Other Rad) Misc. Referral (Misc. Ref)  Problem # 2:  HYPERTENSION (ICD-401.9) Assessment: Comment Only  Her updated medication list for this problem includes:    Amlodipine Besylate 5 Mg Tabs (Amlodipine besylate) ..... One daily  BP today: 120/80 Prior BP: 120/90 (01/18/2010)  Prior 10 Yr Risk Heart Disease: 3 % (06/26/2009)  Labs Reviewed: K+: 3.6 (06/27/2009) Creat: : 0.98 (06/27/2009)   Chol: 187 (02/24/2009)   HDL: 73 (02/24/2009)   LDL: 102 (02/24/2009)   TG: 58  (02/24/2009)  Complete Medication List: 1)  Amlodipine Besylate 5 Mg Tabs (Amlodipine besylate) .... One daily 2)  Cetirizine Hcl 10 Mg Tabs (Cetirizine hcl) .... One tab by mouth once daily 3)  Naprosyn 500 Mg Tabs (Naproxen) .... Take 1 two times a day as needed for pain 4)  Tramadol Hcl 50 Mg Tabs (Tramadol hcl) .... Take 1 -2 every 6 hrs as needed for pain  Patient Instructions: 1)  Please schedule a follow-up appointment in 1 month for pap/physical. 2)  I have refilled your Tramadol for pain. 3)  I have ordered an MRI of your Lumbar Spine & physical therapy. Prescriptions: TRAMADOL HCL 50 MG TABS (TRAMADOL HCL) take 1 -2 every 6 hrs as needed for pain  #60 x 0   Entered and Authorized by:   Esperanza Sheets PA   Signed by:   Esperanza Sheets PA on 01/24/2010   Method used:   Electronically to        [facility]  [address] (retail)       [address]       [place], [place]  [address]       Ph: [phone]       Fax: [phone]   RxID:   [id number]

## 2010-11-12 NOTE — Progress Notes (Signed)
Summary: medication  Phone Note Call from Patient   Summary of Call: patient needs her allergy medication and inhaler called into [facility] in [place]. Initial call taken by: Curtis Sites,  May 13, 2010 10:30 AM  Follow-up for Phone Call        patient needs ov Follow-up by: Adella Hare LPN,  May 13, 2010 11:51 AM  Additional Follow-up for Phone Call Additional follow up Details #1::        pt has appt to come in tomorrow and see PA. Pt aware Additional Follow-up by: Rudene Anda,  May 13, 2010 1:54 PM

## 2010-11-12 NOTE — Progress Notes (Signed)
Summary: Office Visit  Office Visit   Imported By: Lind Guest 04/05/2010 10:29:32  _____________________________________________________________________  External Attachment:    Type:   Image     Comment:   External Document

## 2010-11-12 NOTE — Letter (Signed)
Summary: work paper  work paper   Imported By: Lind Guest 01/30/2010 13:10:02  _____________________________________________________________________  External Attachment:    Type:   Image     Comment:   External Document

## 2010-11-12 NOTE — Assessment & Plan Note (Signed)
Summary: back pain- room 2   Vital Signs:  Patient profile:   45 year old female Height:      66 inches Weight:      183.25 pounds BMI:     29.68 O2 Sat:      96 % on Room air Pulse rate:   85 / minute Resp:     16 per minute BP sitting:   120 / 90  (left arm)  Vitals Entered By: Adella Hare LPN (January 18, 3085 11:33 AM) CC: back pain Is Patient Diabetic? No Pain Assessment Patient in pain? yes     Location: back Intensity: 10 Type: aching Onset of pain  Constant   Primary Provider:  Esperanza Sheets PA  CC:  back pain.  History of Present Illness: Pt presents today with c/o LBP.  She reports pain in the back of her Lt leg x approx 1 mos.  Yesterday developed pain in her lower back.  Feels like the pain shoots up from her leg to her lower back. + numbness/ tingling LLE No prev hx  of low back probs. Has been taking OTC ibuprofen.  This seemed to help initially, but since yesterday not providing much relief. No trauma.  Had been doing some heavy lifting at work.  Is now on an easier job.  She is otherwise feeling well, & is looking forward to softball season starting.     Current Medications (verified): 1)  Amlodipine Besylate 5 Mg Tabs (Amlodipine Besylate) .... One Daily 2)  Cetirizine Hcl 10 Mg Tabs (Cetirizine Hcl) .... One Tab By Mouth Once Daily  Allergies (verified): 1)  ! Penicillin  Review of Systems General:  Denies chills, fever, and weakness. CV:  Denies chest pain or discomfort. Resp:  Denies shortness of breath. GI:  Denies change in bowel habits. GU:  Denies dysuria, incontinence, urinary frequency, and urinary hesitancy. MS:  Complains of low back pain and muscle aches; denies joint pain and muscle weakness. Neuro:  Complains of numbness and tingling.  Physical Exam  General:  Well-developed,well-nourished,in no acute distress; alert,appropriate and cooperative throughout examination Head:  Normocephalic and atraumatic without obvious  abnormalities. No apparent alopecia or balding. Lungs:  Normal respiratory effort, chest expands symmetrically. Lungs are clear to auscultation, no crackles or wheezes. Heart:  Normal rate and regular rhythm. S1 and S2 normal without gallop, murmur, click, rub or other extra sounds. Msk:  + TTP Lt SI and sciatic notch.  Remainder of LS spine nontender.  Pain with slight forward flexion. + SLR Lt,  Neg Rt. Pulses:  R posterior tibial normal, R dorsalis pedis normal, L posterior tibial normal, and L dorsalis pedis normal.   Extremities:  No clubbing, cyanosis, edema, or deformity noted with normal full range of motion of all joints.   Neurologic:  alert & oriented X3, sensation intact to light touch, gait normal, and DTRs symmetrical and normal.   Skin:  Intact without suspicious lesions or rashes Psych:  Cognition and judgment appear intact. Alert and cooperative with normal attention span and concentration. No apparent delusions, illusions, hallucinations   Impression & Recommendations:  Problem # 1:  SCIATICA, LEFT (ICD-724.3) Assessment New  Consider PT if no improv over next week.  Her updated medication list for this problem includes:    Naprosyn 500 Mg Tabs (Naproxen) .[name]... Take 1 two times a day as needed for pain    Tramadol Hcl 50 Mg Tabs (Tramadol hcl) .[name]... Take 1 -2 every 6 hrs as  needed for pain  Orders: Depo- Medrol 80mg  (J1040) Ketorolac-Toradol 15mg  (V4098) Admin of Therapeutic Inj  intramuscular or subcutaneous (11914)  Problem # 2:  HYPERTENSION (ICD-401.9) Assessment: Comment Only  Her updated medication list for this problem includes:    Amlodipine Besylate 5 Mg Tabs (Amlodipine besylate) ..... One daily  BP today: 120/90 Prior BP: 110/70 (12/31/2009)  Prior 10 Yr Risk Heart Disease: 3 % (06/26/2009)  Labs Reviewed: K+: 3.6 (06/27/2009) Creat: : 0.98 (06/27/2009)   Chol: 187 (02/24/2009)   HDL: 73 (02/24/2009)   LDL: 102 (02/24/2009)   TG: 58  (02/24/2009)  Complete Medication List: 1)  Amlodipine Besylate 5 Mg Tabs (Amlodipine besylate) .... One daily 2)  Cetirizine Hcl 10 Mg Tabs (Cetirizine hcl) .... One tab by mouth once daily 3)  Naprosyn 500 Mg Tabs (Naproxen) .... Take 1 two times a day as needed for pain 4)  Tramadol Hcl 50 Mg Tabs (Tramadol hcl) .... Take 1 -2 every 6 hrs as needed for pain  Patient Instructions: 1)  Follow up appt in 1 week if no improvement. 2)  You have received a shot of Depo Medrol today to help with inflammation, and Toradol for pain. 3)  I have prescribed Naprosyn to take two times a day, and Ultram to take as needed for pain. Prescriptions: TRAMADOL HCL 50 MG TABS (TRAMADOL HCL) take 1 -2 every 6 hrs as needed for pain  #40 x 0   Entered and Authorized by:   Esperanza Sheets PA   Signed by:   Syliva Overman MD on 01/18/2010   Method used:   Electronically to        [facility]  [address] (retail)       [address]       [place], [place]  [address]       Ph: [phone]       Fax: [phone]   RxID:   [id number] NAPROSYN 500 MG TABS (NAPROXEN) take 1 two times a day as needed for pain  #40 x 0   Entered and Authorized by:   Esperanza Sheets PA   Signed by:   Syliva Overman MD on 01/18/2010   Method used:   Electronically to        [facility]  [address] (retail)       [address]       [place], [place]  [address]       Ph: [phone]       Fax: [phone]   RxID:   [id number]    Medication Administration  Injection # 1:    Medication: Depo- Medrol 80mg     Diagnosis: SCIATICA, LEFT (ICD-724.3)    Route: IM    Site: RUOQ gluteus    Exp Date: 11/11    Lot #: [id number]    Mfr: Pharmacia    Patient tolerated injection without complications    Given by: Adella Hare LPN (January 18, 2201 1:10 PM)  Injection # 2:    Medication: Ketorolac-Toradol 15mg     Diagnosis: SCIATICA, LEFT (ICD-724.3)    Route: IM    Site: LUOQ gluteus    Exp Date:  05/14/2011    Lot #: [id number]    Mfr: novaplus    Comments: toradol 60mg  given    Patient tolerated injection without complications    Given by: Adella Hare LPN (January 18, 3761 1:11 PM)  Orders Added: 1)  Est. Patient Level IV [[address]] 2)  Depo- Medrol 80mg  [J1040] 3)  Ketorolac-Toradol 15mg  [J1885] 4)  Admin of Therapeutic Inj  intramuscular or subcutaneous [[address]]

## 2010-11-12 NOTE — Progress Notes (Signed)
Summary: asthma med and allergy asap  Phone Note Call from Patient   Summary of Call: out of asthma medicine call in [facility] and allergy pills  call when done asap [phone] Initial call taken by: Lind Guest,  August 12, 2010 1:17 PM    Prescriptions: SYMBICORT 80-4.5 MCG/ACT AERO (BUDESONIDE-FORMOTEROL FUMARATE) use 2 puffs two times a day. rinse mouth and spit after each use  #1 x 1   Entered by:   Adella Hare LPN   Authorized by:   Syliva Overman MD   Signed by:   Adella Hare LPN on 48/54/6270   Method used:   Electronically to        [facility]  [address] (retail)       [address]       [place], [place]  [address]       Ph: [phone]       Fax: [phone]   RxID:   [id number] CETIRIZINE HCL 10 MG TABS (CETIRIZINE HCL) one tab by mouth once daily  #30 x 1   Entered by:   Adella Hare LPN   Authorized by:   Syliva Overman MD   Signed by:   Adella Hare LPN on 35/36/1443   Method used:   Electronically to        [facility]  [address] (retail)       [address]       [place], [place]  [address]       Ph: [phone]       Fax: [phone]   RxID:   [id number] VENTOLIN HFA 108 (90 BASE) MCG/ACT AERS (ALBUTEROL SULFATE) use 2 puffs every 4 hrs as needed  #1 x 1   Entered by:   Adella Hare LPN   Authorized by:   Syliva Overman MD   Signed by:   Adella Hare LPN on 19/37/9024   Method used:   Electronically to        [facility]  [address] (retail)       [address]       [place], [place]  [address]       Ph: [phone]       Fax: [phone]   RxID:   [id number]

## 2010-11-12 NOTE — Progress Notes (Signed)
Summary: MEDICATION  Phone Note Call from Patient   Summary of Call: PATIENT CALLED IN STATES NEED REFILL ON RX SYMBICORT 80/4.5 MG [facility] Initial call taken by: Eugenio Hoes,  September 19, 2010 1:33 PM    Prescriptions: SYMBICORT 80-4.5 MCG/ACT AERO (BUDESONIDE-FORMOTEROL FUMARATE) 2 puffs twice daily  #1 x 1   Entered by:   Adella Hare LPN   Authorized by:   Syliva Overman MD   Signed by:   Adella Hare LPN on 16/07/9603   Method used:   Faxed to ...       [facility] [address] (retail)       [address]       [place], [place]  [address]       Ph: [phone]       Fax: [phone]   RxID:   [id number]

## 2010-11-12 NOTE — Assessment & Plan Note (Signed)
Summary: cough- room 1   Vital Signs:  Patient profile:   45 year old female Height:      66 inches Weight:      191 pounds BMI:     30.94 O2 Sat:      96 % on Room air Pulse rate:   88 / minute Resp:     16 per minute BP sitting:   138 / 90  (left arm)  Vitals Entered By: Adella Hare LPN (June 03, 2010 2:07 PM) CC: dry cough Is Patient Diabetic? No Pain Assessment Patient in pain? no        Primary Provider:  Esperanza Sheets PA  CC:  dry cough.  History of Present Illness: See 05-14-10 OV.  Pt states she continues to have a nonproductive cough.  Is a little worse.  Awakening her from sleep.  Some wheezing after coughing.  Allergies doing much better now that she is back on her allergy meds.  Denies fever or chills.  No nasal congestion or drainage.  Pt states she was on Symbicort x about 1 mos back in Jan and seemed to help alot.  She has been using her albuterol inhaler as needed.     Allergies (verified): 1)  ! Penicillin  Past History:  Past medical history reviewed for relevance to current acute and chronic problems.  Past Medical History: Allergic rhinitis Hypertension Asthma  Review of Systems General:  Denies chills and fever. ENT:  Denies earache, nasal congestion, and sore throat. CV:  Denies chest pain or discomfort. Resp:  Complains of cough and wheezing; denies sputum productive. Allergy:  Complains of seasonal allergies; denies itching eyes and sneezing.  Physical Exam  General:  Well-developed,well-nourished,in no acute distress; alert,appropriate and cooperative throughout examination Head:  Normocephalic and atraumatic without obvious abnormalities. No apparent alopecia or balding. Ears:  External ear exam shows no significant lesions or deformities.  Otoscopic examination reveals clear canals, tympanic membranes are intact bilaterally without bulging, retraction, inflammation or discharge. Hearing is grossly normal bilaterally. Nose:  External  nasal examination shows no deformity or inflammation. Nasal mucosa are pink and moist without lesions or exudates. Mouth:  Oral mucosa and oropharynx without lesions or exudates.  Teeth in good repair. Neck:  No deformities, masses, or tenderness noted. Lungs:  normal respiratory effort and no intercostal retractions.  Tight bilat with faint exp wheeze.  After NMT good air exchange and CTA Heart:  Normal rate and regular rhythm. S1 and S2 normal without gallop, murmur, click, rub or other extra sounds. Cervical Nodes:  No lymphadenopathy noted Psych:  Cognition and judgment appear intact. Alert and cooperative with normal attention span and concentration. No apparent delusions, illusions, hallucinations   Impression & Recommendations:  Problem # 1:  ASTHMA (ICD-493.90) Assessment Deteriorated  Her updated medication list for this problem includes:    Ventolin Hfa 108 (90 Base) Mcg/act Aers (Albuterol sulfate) ..... Use 2 puffs every 4 hrs as needed    Prednisone 20 Mg Tabs (Prednisone) .[name]... Take 2 daily x 4 days    Symbicort 80-4.5 Mcg/act Aero (Budesonide-formoterol fumarate) ..... Use 2 puffs two times a day. rinse mouth and spit after each use  Complete Medication List: 1)  Amlodipine Besylate 5 Mg Tabs (Amlodipine besylate) .... One daily 2)  Cetirizine Hcl 10 Mg Tabs (Cetirizine hcl) .... One tab by mouth once daily 3)  Ventolin Hfa 108 (90 Base) Mcg/act Aers (Albuterol sulfate) .... Use 2 puffs every 4 hrs as needed 4)  Prednisone 20 Mg Tabs (Prednisone) .... Take 2 daily x 4 days 5)  Symbicort 80-4.5 Mcg/act Aero (Budesonide-formoterol fumarate) .... Use 2 puffs two times a day. rinse mouth and spit after each use  Other Orders: Albuterol Sulfate Sol 1mg  unit dose (Z6109) Nebulizer Tx ([address])  Patient Instructions: 1)  Keep your next appt. 2)  I have prescribed Prednisone pills to take by mouth for 4 days. 3)  I have prescribed Symbicort inhaler to use two times a day. 4)   Continue your albuterol inhaler as needed. Prescriptions: SYMBICORT 80-4.5 MCG/ACT AERO (BUDESONIDE-FORMOTEROL FUMARATE) use 2 puffs two times a day. rinse mouth and spit after each use  #1 x 1   Entered and Authorized by:   Esperanza Sheets PA   Signed by:   Esperanza Sheets PA on 06/03/2010   Method used:   Electronically to        [facility]  [address] [address] (retail)       [address]       [place], [place]  [address]       Ph: [phone]       Fax: [phone]   RxID:   [id number] PREDNISONE 20 MG TABS (PREDNISONE) take 2 daily x 4 days  #8 x 0   Entered and Authorized by:   Esperanza Sheets PA   Signed by:   Esperanza Sheets PA on 06/03/2010   Method used:   Electronically to        [facility]  [address] (retail)       [address]       [place], [place]  [address]       Ph: [phone]       Fax: [phone]   RxID:   [id number]    Medication Administration  Medication # 1:    Medication: Albuterol Sulfate Sol 1mg  unit dose    Diagnosis: COUGH (ICD-786.2)    Dose: 2.5mg     Route: inhaled    Exp Date: 8/12    Lot #: [id number]    Mfr: nephron pharm    Patient tolerated medication without complications    Given by: Adella Hare LPN (June 03, 2010 2:49 PM)  Orders Added: 1)  Albuterol Sulfate Sol 1mg  unit dose [J7613] 2)  Nebulizer Tx [94640] 3)  Est. Patient Level III [[address]]

## 2010-11-12 NOTE — Progress Notes (Signed)
Summary: medication  Phone Note Call from Patient   Summary of Call: Patient call request refill on [facility] if need to. [phone] Initial call taken by: Eugenio Hoes,  September 12, 2010 1:11 PM    Prescriptions: CETIRIZINE HCL 10 MG TABS (CETIRIZINE HCL) one tab by mouth once daily  #30 x 1   Entered by:   Adella Hare LPN   Authorized by:   Syliva Overman MD   Signed by:   Adella Hare LPN on 19/14/7829   Method used:   Electronically to        [facility]  [address] (retail)       [address]       [place], [place]  [address]       Ph: [phone]       Fax: [phone]   RxID:   [id number]

## 2010-11-12 NOTE — Progress Notes (Signed)
[facility]   Imported By: Lind Guest 03/05/2010 08:06:38  _____________________________________________________________________  External Attachment:    Type:   Image     Comment:   External Document

## 2010-11-12 NOTE — Progress Notes (Signed)
Summary: medicine  Phone Note Call from Patient   Summary of Call: [facility] Initial call taken by: Lind Guest,  July 15, 2010 9:44 AM    Prescriptions: CETIRIZINE HCL 10 MG TABS (CETIRIZINE HCL) one tab by mouth once daily  #30 x 1   Entered by:   Adella Hare LPN   Authorized by:   Syliva Overman MD   Signed by:   Adella Hare LPN on 08/65/7846   Method used:   Electronically to        [facility]  [address] (retail)       [address]       [place], [place]  [address]       Ph: [phone]       Fax: [phone]   RxID:   [id number]

## 2010-11-12 NOTE — Letter (Signed)
Summary: Out of Work  [facility]  [address]   [place], [place] [address]   Phone: [phone]  Fax: [phone]    January 29, 2010   Employee:  SHATERRA SANZONE    To Whom It May Concern:   For Medical reasons, please excuse the above named employee from work for the following dates:  Start:   01/28/10  End:   02/10/10, or as determined by Neurosurgeon.  If you need additional information, please feel free to contact our office.         Sincerely,    Esperanza Sheets PA

## 2010-11-12 NOTE — Progress Notes (Signed)
Summary: referral on back  Phone Note Call from Patient   Summary of Call: pt has appt at [facility] with PA on 01/31/2010. pt was notified and aware by neurosurgeon office.  Initial call taken by: Rudene Anda,  January 30, 2010 1:38 PM

## 2010-11-12 NOTE — Letter (Signed)
Summary: Out of Work  [facility]  [address]   [place], [place] [address]   Phone: [phone]  Fax: [phone]    August 14, 2010   Employee:  RUTHETTA KOOPMANN    To Whom It May Concern:   For Medical reasons, please excuse the above named employee from work for the following dates:  Start:   08/13/10    End:   08/14/10 to return with no restrictions  If you need additional information, please feel free to contact our office.         Sincerely,    Milus Mallick. Lodema Hong, MD

## 2010-11-12 NOTE — Letter (Signed)
Summary: Work Excuse  [facility]  [address]   [place], [place] [address]   Phone: [phone]  Fax: [phone]    Today's Date: January 24, 2010  Name of Patient: [name]  The above named patient had a medical visit today at:  3:45 pm.  Please take this into consideration when reviewing the time away from work/school.    Special Instructions:  [  ] None  [  ] To be off the remainder of today, returning to the normal work / school schedule tomorrow.  [  ] To be off until the next scheduled appointment on ______________________.  Arly.Keller  ] Other ____Work restriction of no lifting over 20 lbs, and no working over 40 hrs per week from 01-24-10 thru 02-09-10 due to medical reasons. ____________________________________________________________ ________________________________________________________________________   Sincerely yours,   Esperanza Sheets PA

## 2010-11-12 NOTE — Assessment & Plan Note (Signed)
Summary: work in per Dr. needs a dr's note/slj   Vital Signs:  Patient profile:   45 year old female Height:      66 inches Weight:      195 pounds BMI:     31.59 O2 Sat:      95 % on Room air Pulse rate:   101 / minute Pulse rhythm:   regular Resp:     16 per minute BP sitting:   130 / 90  (left arm)  Vitals Entered By: Mauricia Area CMA (August 14, 2010 2:39 PM)  Nutrition Counseling: Patient's BMI is greater than 25 and therefore counseled on weight management options.  O2 Flow:  Room air CC: follow up   Primary Care Aneliz Carbary:  Syliva Overman MD  CC:  follow up.  History of Present Illness: pt dx with asthma approx 1 year ago she had a severe attack in jan this y rhad to call the ambulance then .Yesterday she felt as though she would have another severe attack, since she was out of her reg meds for  about 1 week, due to cost. needs an fMLA. needs note work for nov 1 to return 2. Currently feeling well and back to baseline.  Denies recent fever or chills. Denies sinus pressure, nasal congestion , ear pain or sore throat. Denies chest congestion, or cough productive of sputum. Denies chest pain, palpitations, PND, orthopnea or leg swelling. Denies abdominal pain, nausea, vomitting, diarrhea or constipation. Denies change in bowel movements or bloody stool. Denies dysuria , frequency, incontinence or hesitancy. Denies  joint pain, swelling, or reduced mobility. Denies headaches, vertigo, seizures. Denies depression, anxiety or insomnia.Had back surgery earlier this yr with excellent results. Denies  rash, lesions, or itch.     Allergies (verified): 1)  ! Penicillin  Past History:  Past medical, surgical, family and social histories (including risk factors) reviewed, and no changes noted (except as noted below).  Past Medical History: Reviewed history from 06/03/2010 and no changes required. Allergic rhinitis Hypertension Asthma  Past Surgical History: L  Partial knee replacement - Oct 24, 2008 - Dr. Charlann Boxer Tubal Ligations - age 75 microdiscectomy April 2011, L5 S1 PSH reviewed for relevance  Family History: Reviewed history from 11/02/2008 and no changes required. Mom - Age 76 - HTN Dad - Age 78 - dead MI with CAD Brothers - 1 age 53 - Healthy Sisters -  60 - healthy Children - 47 year old girl - healthy  Social History: Reviewed history from 01/18/2009 and no changes required. Married Occuaption - Scientist, clinical (histocompatibility and immunogenetics) - [employer] Smoker - never ETOH use - socially Lives with spouse and daughter Highest education - high school graduate  Review of Systems      See HPI General:  Complains of fatigue; during acute asthma attack. Eyes:  Denies blurring and discharge. Resp:  Complains of cough, shortness of breath, and wheezing; denies sputum productive; recent asthma attack . Endo:  Denies cold intolerance, excessive thirst, excessive urination, and heat intolerance. Heme:  Denies abnormal bruising and bleeding. Allergy:  Denies hives or rash and itching eyes.  Physical Exam  General:  Well-developed,well-nourished,in no acute distress; alert,appropriate and cooperative throughout examination HEENT: No facial asymmetry,  EOMI, No sinus tenderness, TM's Clear, oropharynx  pink and moist.   Chest: Clear to auscultation bilaterally.  CVS: S1, S2, No murmurs, No S3.   Abd: Soft, Nontender.  MS: Adequate ROM spine, hips, shoulders and knees.  Ext: No edema.   CNS:  CN 2-12 intact, power tone and sensation normal throughout.   Skin: Intact, no visible lesions or rashes.  Psych: Good eye contact, normal affect.  Memory intact, not anxious or depressed appearing.    Impression & Recommendations:  Problem # 1:  ASTHMA (ICD-493.90) Assessment Comment Only  The following medications were removed from the medication list:    Symbicort 80-4.5 Mcg/act Aero (Budesonide-formoterol fumarate) ..... Use 2 puffs two times a day. rinse mouth and spit  after each use Her updated medication list for this problem includes:    Ventolin Hfa 108 (90 Base) Mcg/act Aers (Albuterol sulfate) ..... Use 2 puffs every 4 hrs as needed    Symbicort 80-4.5 Mcg/act Aero (Budesonide-formoterol fumarate) .[name]... 2 puffs twice daily  Problem # 2:  HERNIATED LUMBOSACRAL DISC (ICD-722.10) Assessment: Improved s/p surgery  Problem # 3:  HYPERTENSION (ICD-401.9) Assessment: Unchanged  Her updated medication list for this problem includes:    Amlodipine Besylate 5 Mg Tabs (Amlodipine besylate) ..... One daily Patient advised to follow low sodium diet rich in fruit and vegetables, and to commit to at least 30 minutes 5 days per week of regular exercise , to improve blood presure control.   Orders: T-Basic Metabolic Panel [id number])  BP today: 130/90 Prior BP: 138/90 (06/03/2010)  Prior 10 Yr Risk Heart Disease: 3 % (06/26/2009)  Labs Reviewed: K+: 3.6 (06/27/2009) Creat: : 0.98 (06/27/2009)   Chol: 187 (02/24/2009)   HDL: 73 (02/24/2009)   LDL: 102 (02/24/2009)   TG: 58 (02/24/2009)  Problem # 4:  OVERWEIGHT (ICD-278.02)  Orders: T-Lipid Profile ([id number])  Ht: 66 (08/14/2010)   Wt: 195 (08/14/2010)   BMI: 31.59 (08/14/2010) therapeutic lifestyle change discussed and encouraged  Complete Medication List: 1)  Amlodipine Besylate 5 Mg Tabs (Amlodipine besylate) .... One daily 2)  Cetirizine Hcl 10 Mg Tabs (Cetirizine hcl) .... One tab by mouth once daily 3)  Ventolin Hfa 108 (90 Base) Mcg/act Aers (Albuterol sulfate) .... Use 2 puffs every 4 hrs as needed 4)  Symbicort 80-4.5 Mcg/act Aero (Budesonide-formoterol fumarate) .... 2 puffs twice daily  Other Orders: T-CBC w/Diff [phone]) T-TSH [phone]) T-Vitamin D (25-Hydroxy) [phone])  Patient Instructions: 1)  cPE  mid January 2)    3)  It is important that you exercise regularly at least 20 minutes 5 times a week. If you develop chest pain, have severe difficulty breathing,  or feel very tired , stop exercising immediately and seek medical attention. 4)  You need to lose weight. Consider a lower calorie diet and regular exercise.  5)  BMP prior to visit, ICD-9: 6)  Lipid Panel prior to visit, ICD-9: 7)  TSH prior to visit, ICD-9:   fasting past due 8)  CBC w/ Diff prior to visit, ICD-9: 9)  vit D 10)  pls start daily spirometry. 11)  pls ensure you get  your [facility]amo Prescriptions: SYMBICORT 80-4.5 MCG/ACT AERO (BUDESONIDE-FORMOTEROL FUMARATE) 2 puffs twice daily  #1 x 3   Entered and Authorized by:   Syliva Overman MD   Signed by:   Syliva Overman MD on 08/14/2010   Method used:   Print then Give to Patient   RxID:   [id number]    Orders Added: 1)  Est. Patient Level IV [23762] 2)  T-Basic Metabolic Panel [[id number]] 3)  T-Lipid Profile [[id number]] 4)  T-CBC w/Diff [[id number]] 5)  T-TSH [[id number]] 6)  T-Vitamin D (25-Hydroxy) [[id number]]

## 2010-11-12 NOTE — Assessment & Plan Note (Signed)
Summary: allergies/asthma- room 1   Vital Signs:  Patient profile:   45 year old female Height:      66 inches Weight:      191.75 pounds BMI:     31.06 O2 Sat:      97 % on Room air Pulse rate:   94 / minute Resp:     16 per minute BP sitting:   160 / 100  (left arm)  Vitals Entered By: Adella Hare LPN (May 14, 2010 3:00 PM)  Serial Vital Signs/Assessments:  Time      Position  BP       Pulse  Resp  Temp     By                     156/100                        Esperanza Sheets PA  CC: allergies/asthma Is Patient Diabetic? No Pain Assessment Patient in pain? no        Primary Provider:  Esperanza Sheets PA  CC:  allergies/asthma.  History of Present Illness: Pt presents today with c/o flare in her allergies.  She has been out of her allergy medication for about 4 days now.  She has itchy watery eyes, sneezing,&  clear nasal mucus.  She states when her allergy syptoms flare up or she gets a cold she develops asthma syptoms too.  She has a sample of an albuterol mdi at home but no prescription.  She reports wheezing and difficulty breathing intermittently.  No HS awakening.  Before allergy flare up, no allergy syptoms.  No fever or chills.  Hx of htn.  States she took her medication about 1 hr ago.  No HA, chest pain or pressure.  Allergies (verified): 1)  ! Penicillin  Past History:  Past medical history reviewed for relevance to current acute and chronic problems.  Past Medical History: Reviewed history from 11/02/2008 and no changes required. Allergic rhinitis Hypertension  Review of Systems General:  Denies chills and fever. ENT:  Complains of nasal congestion and postnasal drainage; denies earache, sinus pressure, and sore throat. CV:  Denies chest pain or discomfort and palpitations. Resp:  Complains of cough, shortness of breath, and wheezing; denies sputum productive. Allergy:  Complains of itching eyes, seasonal allergies, and sneezing; denies hives or  rash.  Physical Exam  General:  Well-developed,well-nourished,in no acute distress; alert,appropriate and cooperative throughout examination Head:  Normocephalic and atraumatic without obvious abnormalities. No apparent alopecia or balding. Eyes:  pupils equal, pupils round, pupils reactive to light, no injection, and excessive tearing.   Ears:  External ear exam shows no significant lesions or deformities.  Otoscopic examination reveals clear canals, tympanic membranes are intact bilaterally without bulging, retraction, inflammation or discharge. Hearing is grossly normal bilaterally. Nose:  External nasal examination shows no deformity or inflammation. Nasal mucosa are pink and moist without lesions or exudates. Mouth:  Oral mucosa and oropharynx without lesions or exudates.  Teeth in good repair. Neck:  No deformities, masses, or tenderness noted. Lungs:  Normal respiratory effort, chest expands symmetrically. Lungs are clear to auscultation, no crackles or wheezes. Heart:  Normal rate and regular rhythm. S1 and S2 normal without gallop, murmur, click, rub or other extra sounds. Cervical Nodes:  No lymphadenopathy noted Psych:  Cognition and judgment appear intact. Alert and cooperative with normal attention span and concentration. No apparent delusions, illusions, hallucinations  Impression & Recommendations:  Problem # 1:  ALLERGIC RHINITIS (ICD-477.9) Assessment Deteriorated  Her updated medication list for this problem includes:    Cetirizine Hcl 10 Mg Tabs (Cetirizine hcl) ..... One tab by mouth once daily  Orders: Depo- Medrol 80mg  (J1040) Admin of Therapeutic Inj  intramuscular or subcutaneous (40981)  Problem # 2:  ASTHMA (ICD-493.90) Assessment: Deteriorated  Her updated medication list for this problem includes:    Ventolin Hfa 108 (90 Base) Mcg/act Aers (Albuterol sulfate) ..... Use 2 puffs every 4 hrs as needed  Problem # 3:  HYPERTENSION (ICD-401.9) Assessment:  Comment Only  Her updated medication list for this problem includes:    Amlodipine Besylate 5 Mg Tabs (Amlodipine besylate) ..... One daily  BP today: 160/100 Prior BP: 140/90 (01/29/2010)  Prior 10 Yr Risk Heart Disease: 3 % (06/26/2009)  Labs Reviewed: K+: 3.6 (06/27/2009) Creat: : 0.98 (06/27/2009)   Chol: 187 (02/24/2009)   HDL: 73 (02/24/2009)   LDL: 102 (02/24/2009)   TG: 58 (02/24/2009)  Complete Medication List: 1)  Amlodipine Besylate 5 Mg Tabs (Amlodipine besylate) .... One daily 2)  Cetirizine Hcl 10 Mg Tabs (Cetirizine hcl) .... One tab by mouth once daily 3)  Ventolin Hfa 108 (90 Base) Mcg/act Aers (Albuterol sulfate) .... Use 2 puffs every 4 hrs as needed  Patient Instructions: 1)  Please schedule a follow-up appointment in 1 month for pap and follow up blood pressure. 2)  You have received a shot of Depo Medrol today. 3)  I have  refilled your allergy medicine and prescribed an inhaler for you. Prescriptions: VENTOLIN HFA 108 (90 BASE) MCG/ACT AERS (ALBUTEROL SULFATE) use 2 puffs every 4 hrs as needed  #2 x 2   Entered and Authorized by:   Esperanza Sheets PA   Signed by:   Esperanza Sheets PA on 05/14/2010   Method used:   Electronically to        [facility]  [address] (retail)       [address]       [place], [place]  [address]       Ph: [phone]       Fax: [phone]   RxID:   [id number] CETIRIZINE HCL 10 MG TABS (CETIRIZINE HCL) one tab by mouth once daily  #30 x 5   Entered and Authorized by:   Esperanza Sheets PA   Signed by:   Esperanza Sheets PA on 05/14/2010   Method used:   Electronically to        [facility]  [address] (retail)       [address]       [place], [place]  [address]       Ph: [phone]       Fax: [phone]   RxID:   [id number]    Medication Administration  Injection # 1:    Medication: Depo- Medrol 80mg     Diagnosis: ALLERGIC RHINITIS (ICD-477.9)    Route: IM    Site: RUOQ gluteus     Exp Date: 5/12    Lot #: [id number]    Mfr: [employer]    Patient tolerated injection without complications    Given by: Adella Hare LPN (May 14, 2010 3:48 PM)  Orders Added: 1)  Depo- Medrol 80mg  [J1040] 2)  Admin of Therapeutic Inj  intramuscular or subcutaneous [96372] 3)  Est. Patient Level IV [55732]

## 2010-11-13 ENCOUNTER — Encounter: Payer: Self-pay | Admitting: Family Medicine

## 2010-11-14 NOTE — Letter (Signed)
Summary: Out of Work  [facility]  [address]   [place], [place] [address]   Phone: [phone]  Fax: [phone]    October 28, 2010   Employee:  SAYRE MAZOR    To Whom It May Concern:   For Medical reasons, please excuse the above named employee from work for the following dates:  Start:   10/28/2010  End:   11/07/2010 To return without restrictions  If you need additional information, please feel free to contact our office.         Sincerely,    Milus Mallick. Lodema Hong, M.D.

## 2010-11-14 NOTE — Assessment & Plan Note (Signed)
Summary: phy   Vital Signs:  Patient profile:   45 year old female Menstrual status:  irregular LMP:     10/21/2010 Height:      66 inches Weight:      192.25 pounds BMI:     31.14 O2 Sat:      98 % Pulse rate:   109 / minute Pulse rhythm:   regular Resp:     16 per minute BP sitting:   160 / 102  (left arm) Cuff size:   regular  Vitals Entered By: Everitt Amber LPN (October 28, 2010 10:00 AM)  Nutrition Counseling: Patient's BMI is greater than 25 and therefore counseled on weight management options. CC: broken out really bad with her eczema and also has some sinus congestion   Vision Screening:Left eye w/o correction: 20 / 70 Right Eye w/o correction: 20 / 30 Both eyes w/o correction:  20/ 30       Vision Comments: left glasses in the car  Vision Entered By: Everitt Amber LPN (October 28, 2010 10:12 AM) LMP (date): 10/21/2010     Menstrual Status irregular Enter LMP: 10/21/2010 Last PAP Result NEGATIVE FOR INTRAEPITHELIAL LESIONS OR MALIGNANCY.   Primary Care Provider:  Syliva Overman MD  CC:  broken out really bad with her eczema and also has some sinus congestion .  History of Present Illness: Reports  that she has been feeling ill forthe past 2 weeks.She has had inc sinus pressure and drainage, and her aczema is totally out of control. she also c/o excessive fatigue.  Denies chest pain, palpitations, PND, orthopnea or leg swelling. Denies abdominal pain, nausea, vomitting, diarrhea or constipation. Denies change in bowel movements or bloody stool. Denies dysuria , frequency, incontinence or hesitancy. Denies  joint pain, swelling, or reduced mobility. Denies headaches, vertigo, seizures. Denies depression, anxiety or insomnia.     Current Medications (verified): 1)  Amlodipine Besylate 5 Mg Tabs (Amlodipine Besylate) .... One Daily 2)  Cetirizine Hcl 10 Mg Tabs (Cetirizine Hcl) .... One Tab By Mouth Once Daily 3)  Ventolin Hfa 108 (90 Base) Mcg/act  Aers (Albuterol Sulfate) .... Use 2 Puffs Every 4 Hrs As Needed 4)  Symbicort 80-4.5 Mcg/act Aero (Budesonide-Formoterol Fumarate) .... 2 Puffs Twice Daily  Allergies (verified): 1)  ! Penicillin  Review of Systems      See HPI General:  Complains of fatigue. Eyes:  Complains of blurring and discharge. ENT:  Complains of postnasal drainage and sinus pressure; frontal and maxillary sinus pressure with green drainagex 2 weeks. CV:  Complains of chest pain or discomfort; intermittent chest discomfort x 4 months. Resp:  Complains of wheezing; intermittent wheezing, improved on preventive med. Derm:  Complains of rash; uncontrolled eczema x 2 weeks, involving arms and chest. Endo:  Denies cold intolerance, excessive hunger, excessive thirst, excessive urination, and heat intolerance. Heme:  Denies abnormal bruising and bleeding. Allergy:  Complains of seasonal allergies.  Physical Exam  General:  Well-developed,well-nourished,in no acute distress; alert,appropriate and cooperative throughout examination Head:  Normocephalic and atraumatic without obvious abnormalities. No apparent alopecia or balding. Eyes:  vision grossly intact, pupils equal, pupils round, and pupils reactive to light.   Ears:  External ear exam shows no significant lesions or deformities.  Otoscopic examination reveals clear canals, tympanic membranes are intact bilaterally without bulging, retraction, inflammation or discharge. Hearing is grossly normal bilaterally. Nose:  External nasal examination shows no deformity or inflammation. Nasal mucosa are pink and moist without lesions or exudates.frontal and maxillary  sinus tenderness Mouth:  Oral mucosa and oropharynx without lesions or exudates.  Teeth in good repair. Neck:  No deformities, masses, or tenderness noted. Chest Wall:  No deformities, masses, or tenderness noted. Breasts:  No mass, nodules, thickening, tenderness, bulging, retraction, inflamation, nipple  discharge or skin changes noted.   Lungs:  Normal respiratory effort, chest expands symmetrically. Lungs are clear to auscultation, no crackles or wheezes. Heart:  Normal rate and regular rhythm. S1 and S2 normal without gallop, murmur, click, rub or other extra sounds. Abdomen:  Bowel sounds positive,abdomen soft and  without masses, organomegaly or hernias noted.Epigastric tenderness Rectal:  No external abnormalities noted. Normal sphincter tone. No rectal masses or tenderness. Genitalia:  Normal introitus for age, no external lesions, no vaginal discharge, mucosa pink and moist, no vaginal or cervical lesions, no vaginal atrophy, no friaility or hemorrhage, normal uterus size and position, no adnexal masses or tenderness Msk:  No deformity or scoliosis noted of thoracic or lumbar spine.   Pulses:  R and L carotid,radial,femoral,dorsalis pedis and posterior tibial pulses are full and equal bilaterally Extremities:  No clubbing, cyanosis, edema, or deformity noted with normal full range of motion of all joints.   Neurologic:  No cranial nerve deficits noted. Station and gait are normal. Plantar reflexes are down-going bilaterally. DTRs are symmetrical throughout. Sensory, motor and coordinative functions appear intact. Skin:  severe eczema affecting upper extremeties and trunk Cervical Nodes:  No lymphadenopathy noted Axillary Nodes:  No palpable lymphadenopathy Inguinal Nodes:  No significant adenopathy Psych:  Cognition and judgment appear intact. Alert and cooperative with normal attention span and concentration. No apparent delusions, illusions, hallucinations   Impression & Recommendations:  Problem # 1:  ACUTE SINUSITIS, UNSPECIFIED (ICD-461.9) Assessment Comment Only  The following medications were removed from the medication list:    Septra Ds 800-160 Mg Tabs (Sulfamethoxazole-trimethoprim) .[name]... Take 1 tablet by mouth two times a day Her updated medication list for this problem  includes:    Tetracycline Hcl 500 Mg Caps (Tetracycline hcl) .[name]... Take 1 capsule by mouth four times a day    Metronidazole 500 Mg Tabs (Metronidazole) .[name]... Take 1 tablet by mouth three times a day  Problem # 2:  THYROID FUNCTION TEST, ABNORMAL (ICD-794.5) Assessment: Comment Only  Future Orders: Radiology other (Radiology Other) ... 11/04/2010  Problem # 3:  ASTHMA (ICD-493.90) Assessment: Improved  Her updated medication list for this problem includes:    Ventolin Hfa 108 (90 Base) Mcg/act Aers (Albuterol sulfate) ..... Use 2 puffs every 4 hrs as needed    Symbicort 80-4.5 Mcg/act Aero (Budesonide-formoterol fumarate) .[name]... 2 puffs twice daily  Problem # 4:  OVERWEIGHT (ICD-278.02) Assessment: Unchanged  Ht: 66 (10/28/2010)   Wt: 192.25 (10/28/2010)   BMI: 31.14 (10/28/2010) therapeutic lifestyle change discussed and encouraged  Problem # 5:  HYPERTENSION (ICD-401.9) Assessment: Deteriorated  The following medications were removed from the medication list:    Amlodipine Besylate 5 Mg Tabs (Amlodipine besylate) ..... One daily Her updated medication list for this problem includes:    Amlodipine Besylate 10 Mg Tabs (Amlodipine besylate) .[name]... Take 1 tablet by mouth once a day pt may take two 5mg  daily till done, effective 10/28/2010  Orders: EKG w/ Interpretation (93000)nSR, no ischemia or LVH  BP today: 160/102 Prior BP: 130/90 (08/14/2010)  Prior 10 Yr Risk Heart Disease: 3 % (06/26/2009)  Labs Reviewed: K+: 3.6 (06/27/2009) Creat: : 0.98 (06/27/2009)   Chol: 187 (02/24/2009)   HDL: 73 (02/24/2009)   LDL:  102 (02/24/2009)   TG: 58 (02/24/2009)  Problem # 6:  ECZEMA (ICD-692.9) Assessment: Deteriorated  Her updated medication list for this problem includes:    Cetirizine Hcl 10 Mg Tabs (Cetirizine hcl) ..... One tab by mouth once daily    Triamcinolone Acetonide 0.1 % Crea (Triamcinolone acetonide) .[name]... Apply twice daily as needed for rash, do not use on the  face  Complete Medication List: 1)  Cetirizine Hcl 10 Mg Tabs (Cetirizine hcl) .... One tab by mouth once daily 2)  Ventolin Hfa 108 (90 Base) Mcg/act Aers (Albuterol sulfate) .... Use 2 puffs every 4 hrs as needed 3)  Symbicort 80-4.5 Mcg/act Aero (Budesonide-formoterol fumarate) .... 2 puffs twice daily 4)  Amlodipine Besylate 10 Mg Tabs (Amlodipine besylate) .... Take 1 tablet by mouth once a day pt may take two 5mg  daily till done, effective 10/28/2010 5)  Triamcinolone Acetonide 0.1 % Crea (Triamcinolone acetonide) .... Apply twice daily as needed for rash, do not use on the face 6)  Prilosec 20 Mg Cpdr (Omeprazole) .... Take 1 capsule by mouth once a day 7)  Tetracycline Hcl 500 Mg Caps (Tetracycline hcl) .... Take 1 capsule by mouth four times a day 8)  Metronidazole 500 Mg Tabs (Metronidazole) .... Take 1 tablet by mouth three times a day 9)  Omeprazole 20 Mg Cpdr (Omeprazole) .... Take 1 capsule by mouth two times a day for 2 weeks , while on antibiotics 10)  Synthroid 25 Mcg Tabs (Levothyroxine sodium) .... Take 1 tablet by mouth once a day  Other Orders: TLB-H. Pylori Abs(Helicobacter Pylori) (86677-HELICO) Hemoccult Guaiac-1 spec.(in office) (82270) Pap Smear ([address]403)  Patient Instructions: 1)  Nurse bP check in 1 week. 2)  MD f/u in 3.5 months 3)  Work excuse from 10/28/2010 to return 11/05/2010. 4)  Dose increase on amlodipine as discuseed. 5)  You are being treated for sinusitis, uncontrolled hypertension and eczema, meds are sent in as discused. 6)  fasting labs today 7)  It is important that you exercise regularly at least 20 minutes 5 times a week. If you develop chest pain, have severe difficulty breathing, or feel very tired , stop exercising immediately and seek medical attention. 8)  You need to lose weight. Consider a lower calorie diet and regular exercise.  Prescriptions: SYNTHROID 25 MCG TABS (LEVOTHYROXINE SODIUM) Take 1 tablet by mouth once a day  #30 x 3    Entered and Authorized by:   Syliva Overman MD   Signed by:   Syliva Overman MD on 11/09/2010   Method used:   Historical   RxID:   [id number] OMEPRAZOLE 20 MG CPDR (OMEPRAZOLE) Take 1 capsule by mouth two times a day for 2 weeks , while on antibiotics  #28 x 0   Entered and Authorized by:   Syliva Overman MD   Signed by:   Syliva Overman MD on 10/30/2010   Method used:   Historical   RxID:   [id number] METRONIDAZOLE 500 MG TABS (METRONIDAZOLE) Take 1 tablet by mouth three times a day  #42 x 0   Entered and Authorized by:   Syliva Overman MD   Signed by:   Syliva Overman MD on 10/30/2010   Method used:   Historical   RxID:   [id number] TETRACYCLINE HCL 500 MG CAPS (TETRACYCLINE HCL) Take 1 capsule by mouth four times a day  #56 x 0   Entered and Authorized by:   Syliva Overman MD   Signed by:   Syliva Overman MD  on 10/30/2010   Method used:   Historical   RxID:   [id number] SYMBICORT 80-4.5 MCG/ACT AERO (BUDESONIDE-FORMOTEROL FUMARATE) 2 puffs twice daily  #2 x 0   Entered by:   Everitt Amber LPN   Authorized by:   Syliva Overman MD   Signed by:   Everitt Amber LPN on 14/78/2956   Method used:   Samples Given   RxID:   [id number] PRILOSEC 20 MG CPDR (OMEPRAZOLE) Take 1 capsule by mouth once a day  #30 x 3   Entered and Authorized by:   Syliva Overman MD   Signed by:   Syliva Overman MD on 10/28/2010   Method used:   Electronically to        [facility]  [address] [facility] (retail)       [address]       [place], [place]  [address]       Ph: [phone]       Fax: [phone]   RxID:   [id number] SEPTRA DS 800-160 MG TABS (SULFAMETHOXAZOLE-TRIMETHOPRIM) Take 1 tablet by mouth two times a day  #20 x 0   Entered and Authorized by:   Syliva Overman MD   Signed by:   Syliva Overman MD on 10/28/2010   Method used:   Electronically to        [facility]  [address] [facility] (retail)       [address]       [place], [place]  [address]       Ph: [phone]       Fax: [phone]   RxID:   [id number] PREDNISONE (PAK) 5 MG TABS (PREDNISONE) Use as directed  #21 x 0   Entered and Authorized by:   Syliva Overman MD   Signed by:   Syliva Overman MD on 10/28/2010   Method used:   Electronically to        [facility]  [address] [facility] (retail)       [address]       [place], [place]  [address]       Ph: [phone]       Fax: [phone]   RxID:   [id number] TRIAMCINOLONE ACETONIDE 0.1 % CREA (TRIAMCINOLONE ACETONIDE) apply twice daily as needed for rash, do not use on the face  #45gm x 1   Entered and Authorized by:   Syliva Overman MD   Signed by:   Syliva Overman MD on 10/28/2010   Method used:   Print then Give to Patient   RxID:   [id number] AMLODIPINE BESYLATE 10 MG TABS (AMLODIPINE BESYLATE) Take 1 tablet by mouth once a day pt may take tWO 5mg  daily till done, effective 10/28/2010  #30 x 3   Entered and Authorized by:   Syliva Overman MD   Signed by:   Syliva Overman MD on 10/28/2010   Method used:   Printed then faxed to ...       [facility] [address] (retail)       [address]       [place], [place]  [address]       Ph: [phone]       Fax: [phone]   RxID:   [id number]  lot [phone] exp. 12/2011 Symbicort 80/4.5  Orders Added: 1)  TLB-H. Pylori Abs(Helicobacter Pylori) [86677-HELICO] 2)  Est. Patient 40-64 years [phone]  3)  EKG w/ Interpretation [93000] 4)  Hemoccult Guaiac-1 spec.(in office) [82270] 5)  Pap Smear [88150] 6)  Radiology other [Radiology Other]    Laboratory Results  Date/Time Received: October 28, 2010 11:14 AM  Date/Time Reported: October 28, 2010 11:14 AM   Stool - Occult Blood Hemmoccult #1: negative Date: 10/24/2010 Comments: 5030 5/14 51301 13L 10/13 Adella Hare LPN  October 28, 2010 11:15 AM   symbicort 80/4.5 samples given x2 [id number] D00 3/13

## 2010-11-14 NOTE — Letter (Signed)
Summary: LAB ADD ON  LAB ADD ON   Imported By: Lind Guest 10/31/2010 08:45:36  _____________________________________________________________________  External Attachment:    Type:   Image     Comment:   External Document

## 2010-11-14 NOTE — Assessment & Plan Note (Signed)
Summary: B P CK  Nurse Visit   Vital Signs:  Patient profile:   45 year old female Height:      66 inches Weight:      196 pounds BP sitting:   110 / 90  (left arm)  Vitals Entered By: Everitt Amber LPN (November 04, 2010 11:32 AM) CC: In for BP check    Allergies: 1)  ! Penicillin rechecked and agree, pt to continue current BP meds

## 2010-11-14 NOTE — Progress Notes (Signed)
Summary: paper  Phone Note Call from Patient   Summary of Call: have you finished her paper needs it faxed back by tomorrow   fax at  [phone] Initial call taken by: Lind Guest,  November 05, 2010 9:14 AM  Follow-up for Phone Call        completed, pls fax and let her know Follow-up by: Syliva Overman MD,  November 05, 2010 5:22 PM  Additional Follow-up for Phone Call Additional follow up Details #1::        faxed and patient aware Additional Follow-up by: Lind Guest,  November 06, 2010 8:44 AM

## 2010-11-14 NOTE — Progress Notes (Signed)
Summary: medicine  Phone Note Call from Patient   Summary of Call: said the nurse called her about her labs and was supposed to send in another antibiotic and has not received it yet just wanted to let you know please call in   left message Initial call taken by: Lind Guest,  October 31, 2010 7:47 AM  Follow-up for Phone Call        meds sent in again and called to confirm pharmacy recieved. Patient aware Follow-up by: Everitt Amber LPN,  October 31, 2010 9:08 AM    Prescriptions: TETRACYCLINE HCL 500 MG CAPS (TETRACYCLINE HCL) Take 1 capsule by mouth four times a day  #56 x 0   Entered by:   Everitt Amber LPN   Authorized by:   Syliva Overman MD   Signed by:   Everitt Amber LPN on 16/07/9603   Method used:   Electronically to        [facility]  [address] (retail)       [address]       [place], [place]  [address]       Ph: [phone]       Fax: [phone]   RxID:   [id number] METRONIDAZOLE 500 MG TABS (METRONIDAZOLE) Take 1 tablet by mouth three times a day  #42 x 0   Entered by:   Everitt Amber LPN   Authorized by:   Syliva Overman MD   Signed by:   Everitt Amber LPN on 08/09/2535   Method used:   Electronically to        [facility]  [address] (retail)       [address]       [place], [place]  [address]       Ph: [phone]       Fax: [phone]   RxID:   [id number] OMEPRAZOLE 20 MG CPDR (OMEPRAZOLE) Take 1 capsule by mouth two times a day for 2 weeks , while on antibiotics  #28 x 0   Entered by:   Everitt Amber LPN   Authorized by:   Syliva Overman MD   Signed by:   Everitt Amber LPN on 32/35/5732   Method used:   Electronically to        [facility]  [address] (retail)       [address]       [place], [place]  [address]       Ph: [phone]       Fax: [phone]   RxID:   [id number]

## 2010-11-14 NOTE — Progress Notes (Signed)
Summary: reaction  Phone Note Call from Patient   Summary of Call: pt got stomach medicine pills. face got red and thinks she may have had a reaction. pt okay now but face still red.  [phone] Initial call taken by: Rudene Anda,  November 07, 2010 8:39 AM  Follow-up for Phone Call        Does patient need to stop med? Follow-up by: Everitt Amber LPN,  November 07, 2010 9:48 AM  Additional Follow-up for Phone Call Additional follow up Details #1::        advise she needs to stop the meds for 2 to 3 days, if face nl restart  one abiotic do the flagyll alone for 2 days, see if any reaction, then if not do the tetracycline alng with the flagyll Additional Follow-up by: Syliva Overman MD,  November 07, 2010 12:12 PM    Additional Follow-up for Phone Call Additional follow up Details #2::    She took the meds monday evening and then tuesday morning and thats when she noticed the redness in her face. Advised her to stop all meds for 2-3 days and then start back taking the flagyl for 2 days, if no reaction then take the tetracycline with the flagyl and see if there is a reaction then. Patient aware Follow-up by: Everitt Amber LPN,  November 07, 2010 12:20 PM

## 2010-11-14 NOTE — Progress Notes (Signed)
Summary: fyi/ medicine  Phone Note Call from Patient   Summary of Call: she going to have the pharmacy to send over a request for the medicine for her exzema and she had to use it on her face ,back and arms and she needs some more it is working for her Initial call taken by: Lind Guest,  October 31, 2010 1:56 PM  Follow-up for Phone Call        noted Follow-up by: Adella Hare LPN,  October 31, 2010 2:48 PM

## 2010-11-14 NOTE — Letter (Signed)
Summary: Letter  Letter   Imported By: Lind Guest 11/05/2010 10:48:19  _____________________________________________________________________  External Attachment:    Type:   Image     Comment:   External Document

## 2010-11-14 NOTE — Progress Notes (Signed)
Summary: medication  Phone Note Call from Patient   Summary of Call: patient needs her amlodipine 5 mg called into [facility] for 3 month supply and her Certizine 10mg  called into [facility], patient was advised from now on to please call pharmacy and have them fax a request. Initial call taken by: Curtis Sites,  October 10, 2010 10:15 AM    Prescriptions: CETIRIZINE HCL 10 MG TABS (CETIRIZINE HCL) one tab by mouth once daily  #30 x 0   Entered by:   Adella Hare LPN   Authorized by:   Syliva Overman MD   Signed by:   Adella Hare LPN on 11/91/4782   Method used:   Electronically to        [facility]  [address] (retail)       [address]       [place], [place]  [address]       Ph: [phone]       Fax: [phone]   RxID:   [id number] AMLODIPINE BESYLATE 5 MG TABS (AMLODIPINE BESYLATE) One daily  #90 Tablet x 0   Entered by:   Adella Hare LPN   Authorized by:   Syliva Overman MD   Signed by:   Adella Hare LPN on 47/42/5956   Method used:   Faxed to ...       [facility] [address] (retail)       [address]       [place], [place]  [address]       Ph: [phone]       Fax: [phone]   RxID:   [id number]

## 2010-11-14 NOTE — Letter (Signed)
Summary: Out of Work  [facility]  [address]   [place], [place] [address]   Phone: [phone]  Fax: [phone]    October 28, 2010   Employee:  OPEL LEJEUNE    To Whom It May Concern:   For Medical reasons, please excuse the above named employee from work for the following dates:  Start:   10/28/2010  End:   11/05/2010 To return without restrictions  If you need additional information, please feel free to contact our office.         Sincerely,    Milus Mallick. Lodema Hong, M.D.

## 2010-11-14 NOTE — Letter (Signed)
Summary: dose increase  dose increase   Imported By: Lind Guest 10/29/2010 11:21:11  _____________________________________________________________________  External Attachment:    Type:   Image     Comment:   External Document

## 2010-11-19 ENCOUNTER — Ambulatory Visit: Payer: Managed Care, Other (non HMO) | Admitting: Family Medicine

## 2010-11-19 ENCOUNTER — Encounter (INDEPENDENT_AMBULATORY_CARE_PROVIDER_SITE_OTHER): Payer: Self-pay

## 2010-11-20 NOTE — Letter (Signed)
Summary: take home instructions from ed  take home instructions from ed   Imported By: Luann Bullins 11/12/2010 13:45:08  _____________________________________________________________________  External Attachment:    Type:   Image     Comment:   External Document

## 2010-11-20 NOTE — Letter (Signed)
Summary: Out of Work  [facility]  [address]   [place], [place] [address]   Phone: [phone]  Fax: [phone]    November 12, 2010   Employee:  KETINA MARS    To Whom It May Concern:   For Medical reasons, please excuse the above named employee from work for the following dates:  Start:    End:    If you need additional information, please feel free to contact our office.         Sincerely,    Everitt Amber LPN

## 2010-11-20 NOTE — Assessment & Plan Note (Signed)
Summary: office visit   Vital Signs:  Patient profile:   45 year old female Menstrual status:  irregular Height:      66 inches Weight:      198.25 pounds BMI:     32.11 O2 Sat:      97 % on Room air Pulse rate:   96 / minute Resp:     16 per minute BP sitting:   140 / 96  (left arm)  Vitals Entered By: Adella Hare LPN (November 12, 2010 9:37 AM)  O2 Flow:  Room air CC: er follow up Is Patient Diabetic? No Comments didnt bring meds to ov but states list is accurate   Primary Care Provider:  Syliva Overman MD  CC:  er follow up.  History of Present Illness: 3 month h/o in termittent chest pain, not necessarily associated with actuivity, non radiating, sometimes gets a cramp in her right armk with it.Sometimes feels a little hot with the pain.. She has also noticed exertional fatigue, has to sit and rest with even doing her housework in thepast 3 months.  Last night pt woke out of sleep at 8:30 with substernal pressure, assocd with nausea , diaphoresis, weakness, called EMS , had a negative initial cardiac eval, and d/c after approx 12 hrs. Staes only morphine relieved her symptoms, no NTG or asprin.   Father had estab CAD at age 80 and died of heart age 67  Workplace will not  re-employ her before full card eval.  Pt recently dx with h pylori ds, and is attempting to take the antibiotics. She also has a new dx of underactive thyroid  Current Medications (verified): 1)  Cetirizine Hcl 10 Mg Tabs (Cetirizine Hcl) .... One Tab By Mouth Once Daily 2)  Ventolin Hfa 108 (90 Base) Mcg/act Aers (Albuterol Sulfate) .... Use 2 Puffs Every 4 Hrs As Needed 3)  Symbicort 80-4.5 Mcg/act Aero (Budesonide-Formoterol Fumarate) .... 2 Puffs Twice Daily 4)  Amlodipine Besylate 10 Mg Tabs (Amlodipine Besylate) .... Take 1 Tablet By Mouth Once A Day Pt May Take Two 5mg  Daily Till Done, Effective 10/28/2010 5)  Triamcinolone Acetonide 0.1 % Crea (Triamcinolone Acetonide) .... Apply Twice  Daily As Needed For Rash, Do Not Use On The Face 6)  Prilosec 20 Mg Cpdr (Omeprazole) .... Take 1 Capsule By Mouth Once A Day 7)  Omeprazole 20 Mg Cpdr (Omeprazole) .... Take 1 Capsule By Mouth Two Times A Day For 2 Weeks , While On Antibiotics 8)  Synthroid 25 Mcg Tabs (Levothyroxine Sodium) .... Take 1 Tablet By Mouth Once A Day  Allergies (verified): 1)  ! Penicillin  Family History: Mom - Age 51 - HTN Dad - Age 101 - dead MI with CAD first dx at age 6 Brothers - 1 age 72 - Healthy Sisters -  42 - healthy Children - daughter born in 2006  Review of Systems      See HPI General:  Complains of fatigue, sleep disorder, and weakness. Eyes:  Denies discharge. ENT:  Denies hoarseness and nasal congestion. CV:  Complains of chest pain or discomfort and shortness of breath with exertion; denies difficulty breathing while lying down, palpitations, swelling of feet, and swelling of hands. GI:  Complains of abdominal pain and indigestion; GERD. GU:  Denies dysuria and urinary frequency. Neuro:  Denies headaches, seizures, and tingling. Psych:  Denies anxiety and depression. Endo:  new dx of hypothyroid. Heme:  Denies abnormal bruising and bleeding. Allergy:  Complains of seasonal allergies.  Physical Exam  General:  Well-developed,well-nourished,in no acute distress; alert,appropriate and cooperative throughout examination HEENT: No facial asymmetry,  EOMI, No sinus tenderness, TM's Clear, oropharynx  pink and moist.   Chest: Clear to auscultation bilaterally.  CVS: S1, S2, No murmurs, No S3.   Abd: Soft, Nontender.  MS: Adequate ROM spine, hips, shoulders and knees.  Ext: No edema.   CNS: CN 2-12 intact, power tone and sensation normal throughout.   Skin: Intact, no visible lesions or rashes.  Psych: Good eye contact, normal affect.  Memory intact, not anxious or depressed appearing.    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 Take 1 capsule by mouth once a day    Omeprazole 20 Mg Cpdr (Omeprazole) .[name]... Take 1 capsule by mouth two times a day for 2 weeks , while on antibiotics attempting to reintroduce teracycline and flagyll  Problem # 2:  FATIGUE (ICD-780.79) Assessment: Deteriorated  Problem # 3:  CHEST PAIN UNSPECIFIED (ICD-786.50) Assessment: Comment Only  Orders: Cardiology Referral (Cardiology)  Complete Medication List: 1)  Cetirizine Hcl 10 Mg Tabs (Cetirizine hcl) .... One tab by mouth once daily 2)  Ventolin Hfa 108 (90 Base) Mcg/act Aers (Albuterol sulfate) .... Use 2 puffs every 4 hrs as needed 3)  Symbicort 80-4.5 Mcg/act Aero (Budesonide-formoterol fumarate) .... 2 puffs twice daily 4)  Amlodipine Besylate 10 Mg Tabs (Amlodipine besylate) .... Take 1 tablet by mouth once a day pt may take two 5mg  daily till done, effective 10/28/2010 5)  Triamcinolone Acetonide 0.1 % Crea (Triamcinolone acetonide) .... Apply twice daily as needed for rash, do not use on the face 6)  Prilosec 20 Mg Cpdr (Omeprazole) .... Take 1 capsule by mouth once a day 7)  Omeprazole 20 Mg Cpdr (Omeprazole) .... Take 1 capsule by mouth two times a day for 2 weeks , while on antibiotics 8)  Synthroid 25 Mcg Tabs (Levothyroxine sodium) .... Take 1 tablet by mouth once a day  Patient Instructions: 1)  f/u as before 2)  You will get  a sooner appt with card, plds check at the desk   Orders Added: 1)  Cardiology Referral [Cardiology] 2)  Est. Patient Level IV [64403]

## 2010-11-20 NOTE — Letter (Signed)
Summary: Out of Work  [facility]  [address]   [place], [place] [address]   Phone: [phone]  Fax: [phone]    November 12, 2010   Employee:  ZAARA SPROWL    To Whom It May Concern:   For Medical reasons, please excuse the above named employee from work for the following dates:  Start:   11/12/2010  End:   11/14/2010 To return to work with no restrictions  If you need additional information, please feel free to contact our office.         Sincerely,    Milus Mallick. Lodema Hong, M.D.

## 2010-11-20 NOTE — Letter (Signed)
Summary: Out of Work  [facility]  [address]   [place], [place] [address]   Phone: [phone]  Fax: [phone]    November 12, 2010   Employee:  [name]    To Whom It May Concern:   For Medical reasons, please excuse the above named employee from work for the following dates:  Start:   11/12/2010  End:     11/13/2010 Has scheduled appointment with Cardiology for further evaluation  If you need additional information, please feel free to contact our office.         Sincerely,    Milus Mallick. Lodema Hong, M.D.

## 2010-11-27 ENCOUNTER — Ambulatory Visit: Payer: Managed Care, Other (non HMO) | Admitting: Cardiology

## 2010-11-28 NOTE — Letter (Signed)
Summary: Out of Work  [facility]  [address]   [place], [place] [address]   Phone: [phone]  Fax: [phone]    November 19, 2010   Employee:  PIXIE BURGENER    To Whom It May Concern:   For Medical reasons, please excuse the above named employee from work for the following dates:  Start:   11/12/2010  End:   11/13/2010 (Has appointment with cardiology for further evaluation)  If you need additional information, please feel free to contact our office.         Sincerely,    Milus Mallick. Lodema Hong, M.D.

## 2010-11-28 NOTE — Letter (Signed)
[facility]   Imported By: Lind Guest 11/18/2010 14:06:10  _____________________________________________________________________  External Attachment:    Type:   Image     Comment:   External Document

## 2010-12-03 ENCOUNTER — Encounter: Payer: Self-pay | Admitting: Family Medicine

## 2010-12-05 ENCOUNTER — Emergency Department (HOSPITAL_COMMUNITY)
Admission: EM | Admit: 2010-12-05 | Discharge: 2010-12-05 | Disposition: A | Discharge status: Home / Self Care | Payer: Managed Care, Other (non HMO) | Attending: Emergency Medicine | Admitting: Emergency Medicine

## 2010-12-05 DIAGNOSIS — T7840XA Allergy, unspecified, initial encounter: Secondary | ICD-10-CM | POA: Insufficient documentation

## 2010-12-05 DIAGNOSIS — Y92009 Unspecified place in unspecified non-institutional (private) residence as the place of occurrence of the external cause: Secondary | ICD-10-CM | POA: Insufficient documentation

## 2010-12-05 DIAGNOSIS — I10 Essential (primary) hypertension: Secondary | ICD-10-CM | POA: Insufficient documentation

## 2010-12-10 NOTE — Letter (Signed)
Summary: lab add on  lab add on   Imported By: Luann Bullins 12/03/2010 16:38:49  _____________________________________________________________________  External Attachment:    Type:   Image     Comment:   External Document

## 2010-12-10 NOTE — Letter (Signed)
Summary: lab add on  lab add on   Imported By: Luann Bullins 12/03/2010 16:38:31  _____________________________________________________________________  External Attachment:    Type:   Image     Comment:   External Document

## 2010-12-13 ENCOUNTER — Telehealth: Payer: Self-pay | Admitting: Family Medicine

## 2010-12-19 NOTE — Progress Notes (Signed)
Summary: speak  Phone Note Call from Patient   Summary of Call: needs to speak with nurse about predisone [phone] Initial call taken by: Rudene Anda,  December 13, 2010 8:20 AM  Follow-up for Phone Call        states er gave her prednisone for allergic reaction to a cream, states she was given 5 days of the med but feels she needs more of this Follow-up by: Adella Hare LPN,  December 13, 2010 2:06 PM  Additional Follow-up for Phone Call Additional follow up Details #1::        pls refill x 1 only and let her know Additional Follow-up by: Syliva Overman MD,  December 13, 2010 3:07 PM    Additional Follow-up for Phone Call Additional follow up Details #2::    rx sent, called patient, no answer Follow-up by: Adella Hare LPN,  December 13, 2010 4:40 PM  New/Updated Medications: PREDNISONE 20 MG TABS (PREDNISONE) one tab by mouth two times a day x 5 days Prescriptions: PREDNISONE 20 MG TABS (PREDNISONE) one tab by mouth two times a day x 5 days  #10 x 0   Entered by:   Adella Hare LPN   Authorized by:   Syliva Overman MD   Signed by:   Adella Hare LPN on 09/81/1914   Method used:   Electronically to        [facility]  [address] (retail)       [address]       [place], [place]  [address]       Ph: [phone]       Fax: [phone]   RxID:   [id number]

## 2010-12-31 LAB — CBC
HCT: 42.2 % (ref 36.0–46.0)
Hemoglobin: 14.3 g/dL (ref 12.0–15.0)
MCV: 86.3 fL (ref 78.0–100.0)
RBC: 4.89 MIL/uL (ref 3.87–5.11)
WBC: 9.8 10*3/uL (ref 4.0–10.5)

## 2010-12-31 LAB — BASIC METABOLIC PANEL
Calcium: 9.4 mg/dL (ref 8.4–10.5)
GFR calc non Af Amer: >60 mL/min (ref >60)
Potassium: 3.7 mEq/L (ref 3.5–5.1)
Sodium: 136 mEq/L (ref 135–145)

## 2011-01-05 ENCOUNTER — Other Ambulatory Visit: Payer: Self-pay | Admitting: Family Medicine

## 2011-01-27 LAB — BASIC METABOLIC PANEL
BUN: 10 mg/dL (ref 6–23)
CO2: 27 mEq/L (ref 19–32)
Calcium: 8.7 mg/dL (ref 8.4–10.5)
Chloride: 101 mEq/L (ref 96–112)
Creatinine, Ser: 0.79 mg/dL (ref 0.4–1.2)
GFR calc Af Amer: >60 mL/min (ref >60)
GFR calc non Af Amer: >60 mL/min (ref >60)
Glucose, Bld: 141 mg/dL — ABNORMAL HIGH (ref 70–99)
Glucose, Bld: 74 mg/dL (ref 70–99)
Potassium: 4.3 mEq/L (ref 3.5–5.1)
Sodium: 134 mEq/L — ABNORMAL LOW (ref 135–145)

## 2011-01-27 LAB — DIFFERENTIAL
Basophils Absolute: 0 10*3/uL (ref 0.0–0.1)
Eosinophils Absolute: 0.5 10*3/uL (ref 0.0–0.7)
Eosinophils Relative: 7 % — ABNORMAL HIGH (ref 0–5)
Lymphs Abs: 1.3 10*3/uL (ref 0.7–4.0)
Neutrophils Relative %: 66 % (ref 43–77)

## 2011-01-27 LAB — CBC
HCT: 32.3 % — ABNORMAL LOW (ref 36.0–46.0)
Hemoglobin: 10.8 g/dL — ABNORMAL LOW (ref 12.0–15.0)
MCHC: 33.5 g/dL (ref 30.0–36.0)
MCV: 81.4 fL (ref 78.0–100.0)
MCV: 81.6 fL (ref 78.0–100.0)
Platelets: 315 10*3/uL (ref 150–400)
RBC: 3.97 MIL/uL (ref 3.87–5.11)
RDW: 15.8 % — ABNORMAL HIGH (ref 11.5–15.5)
RDW: 15.9 % — ABNORMAL HIGH (ref 11.5–15.5)
WBC: 6.7 10*3/uL (ref 4.0–10.5)

## 2011-01-27 LAB — URINALYSIS, ROUTINE W REFLEX MICROSCOPIC
Glucose, UA: NEGATIVE mg/dL
Leukocytes, UA: NEGATIVE
Specific Gravity, Urine: 1.021 (ref 1.005–1.030)
pH: 6 (ref 5.0–8.0)

## 2011-01-27 LAB — PROTIME-INR
INR: 0.9 (ref 0.00–1.49)
Prothrombin Time: 12.6 seconds (ref 11.6–15.2)

## 2011-01-27 LAB — URINE MICROSCOPIC-ADD ON

## 2011-01-27 LAB — PREGNANCY, URINE: Preg Test, Ur: NEGATIVE

## 2011-01-27 LAB — TYPE AND SCREEN: Antibody Screen: NEGATIVE

## 2011-02-18 DIAGNOSIS — Z0279 Encounter for issue of other medical certificate: Secondary | ICD-10-CM

## 2011-02-25 NOTE — Op Note (Signed)
NAMECHERINE, DRUMGOOLE                ACCOUNT NO.:  [id number]   MEDICAL RECORD NO.:  [id number]          PATIENT TYPE:  INP   LOCATION:  1603                         FACILITY:  [facility]   PHYSICIAN:  Madlyn Frankel. Charlann Boxer, M.D.  DATE OF BIRTH:  22-Feb-1966   DATE OF PROCEDURE:  10/23/2008  DATE OF DISCHARGE:                               OPERATIVE REPORT   PREOPERATIVE DIAGNOSIS:  Left knee medial compartment osteoarthritis.   POSTOPERATIVE DIAGNOSIS:  Left knee medial compartment osteoarthritis.   PROCEDURE:  Left unicompartmental partial knee replacement using Biomet  Oxford knee system size small femur, a medial left tibia and a 4 mm  insert to match the small femur.   SURGEON:  Durene Romans, M.D.   ASSISTANT:  Dwyane Luo.   ANESTHESIA:  Duramorph spinal.   COMPLICATIONS:  None.   DRAINS:  One medium Hemovac.   TOURNIQUET TIME:  40 minutes with 250 mmHg.   SPECIMENS:  None.   INDICATION FOR PROCEDURE:  This is a 45 year old female who had been  followed over the last year or so with progressive left knee pain.  Radiographs revealed bone-on-bone arthritis with complaints  predominantly medially.   There was evidence of some soft femoral changes osteophytes, however,  there was no significant anterior knee pain.  At 45 years of age she had  very little options.  She had failed conservative measures including  bracing.  The results of high tibia osteotomies were not totally  accepted to me at this point and this partial knee replacement was  offered with risks and benefits of this procedure including failure of  the component or need to be revised to a total knee replacement were all  discussed and reviewed.  Consent was obtained for the benefit of pain  relief.   PROCEDURE IN DETAIL:  The patient was brought to operative theater.  Once adequate anesthesia, preoperative antibiotics administered the  patient was positioned supine.  A fly tourniquet was placed.  The leg  was then  positioned on the Oxford leg holder.  We then prescrubbed and  prepped and draped the left lower extremity.   Leg length was exsanguinated.  The tourniquet elevated to 250 mmHg.  Time-out was performed identifying the patient to me.   With the leg in the leg holder a para midline incision was made from  just proximal to patella to the tubercle.  Following the initial  debridement, I removed significant osteophytes along the medial aspect  of the femur.  In addition to that, on the proximal pole of the patella.   I removed some osteophytes from the medial notch.   The extramedullary guide was then placed in the proximal tibia and I  used a reciprocating saw than an oscillating saw to make a cut.  I then  checked with a size 8 tibial tray which fit best.  The 4 feeler gauge  fit within this space with a good tension.   At this point, the femoral canal was opened with a drill.  The anvil and  awl then placed the intramedullary rod.  Using a  size 8 tibial tray and  3 feeler gauge, the guide for the posterior cutting block was  positioned.  It was positioned into the medial third of the femoral  condyle and then the holes drilled.  The posterior cutting block was  placed and the posterior cut made.  This created a rectangular box cut  at this point.   At this point, I placed a zero spigot and milled the distal femur.  A  trial reduction was carried out with small femur and tibia in flexion  the 4 feeler gauge felt great in extension, however, I was unable to be  the one end satisfactory.  For this reason I used the 4 spigot next.  The distal femur was milled, bone was removed.   Trial reduction was again carried out and at this point the tension  appeared to be symmetric and flexion to 20 degrees of flexion with a 4  feeler gauge.  Given this, the trial femur was removed and the 4 tibial  tray in place and then used a reciprocating saw to create the trough  initially, then used a  keeled osteotome to create the keeled trough for  the component.   A keeled tibial tray was then impacted in place and trial reduction was  carried out with the lollipop size 4 insert.  Again,  these appeared to  be symmetrically balanced with 20 degrees of flexion and 90 degrees of  adduction.  Given these parameters, all trial components were removed.  I drilled the holes into sclerotic bone, irrigated the knee.  We  injected synovial capsule junction with 30 mL of 0.25% Marcaine with  epinephrine and 1 mL of Toradol.   The final components were opened and cement mixed in one batch.  The  tibial component was cemented first onto a dried tibial cut surface and  the trial femur placed with the knee held in 45 degrees of flexion for  two minutes.  The trial femur was removed, excessive cement was removed  throughout the posterior aspect of the knee.  The femoral canal was then  cemented in position and the knee was again brought to 45 degrees of  flexion to remove extruded cement and allow cement cured.  Once the  cement cured, excessive cement was removed.  Once I was unable to  visualize any cement using the mirrored surface of the tibial component  to the posterior femur, the final 4 insert was placed.   We irrigated the knee again, placed a medium Hemovac drain deep.  Tourniquet was let down after 40 minutes of 250 mmHg.   Extensor mechanism was then reapproximated using #1 Vicryl.  The  remaining wound was closed with 2-0 Vicryl and running 4-0 Monocryl.  The knee was cleaned, dried, and dressed sterilely with Steri-Strips and  a bulky sterile wrap.  She was brought to recovery in stable condition.      Madlyn Frankel Charlann Boxer, M.D.  Electronically Signed    MDO/MEDQ  D:  10/24/2008  T:  10/24/2008  Job:  [id number]

## 2011-02-25 NOTE — H&P (Signed)
[name], [name]                ACCOUNT NO.:  [id number]   MEDICAL RECORD NO.:  [id number]          PATIENT TYPE:  INP   LOCATION:  NA                           FACILITY:  [facility]   PHYSICIAN:  Madlyn Frankel. Charlann Boxer, M.D.  DATE OF BIRTH:  01-27-1966   DATE OF ADMISSION:  10/23/2008  DATE OF DISCHARGE:                              HISTORY & PHYSICAL   PROCEDURE:  Left unicondylar knee replacement of the medial compartment.   CHIEF COMPLAINT:  Left medial knee pain.   HISTORY OF PRESENT ILLNESS:  A 45 year old female with a history of left  medial knee pain secondary to osteoarthritis.  It has been refractory to  all conservative treatment, including oral anti-inflammatory, cortisone  injection, and viscus supplementation.   PAST MEDICAL HISTORY:  1. Osteoarthritis.  2. Borderline hypertension.   PREVIOUS SURGERY:  Tubal ligation in May, 2006.   FAMILY HISTORY:  Coronary artery disease.   SOCIAL HISTORY:  Nonsmoker.   DRUG ALLERGIES:  No known drug allergies.   MEDICATIONS:  Ibuprofen p.r.n.   REVIEW OF SYSTEMS:  None other than in HPI.   PHYSICAL EXAMINATION:  Pulse 72, respirations 16, blood pressure 122/96.  GENERAL:  Awake, alert and oriented.  HEENT:  Normocephalic.  NECK:  Supple.  No carotid bruits.  CHEST:  Lungs are clear to auscultation bilaterally.  BREASTS:  Deferred.  HEART:  Regular rate and rhythm.  S1 and S2 distinct.  ABDOMEN:  Soft, nontender.  Bowel sounds present.  PELVIS:  Stable.  GENITOURINARY:  Deferred.  EXTREMITIES:  Left medial knee pain.  SKIN:  No cellulitis.  NEUROLOGIC:  Intact distal sensibilities.   LABORATORY DATA:  Labs, EKG, chest x-ray all pending pre-surgical  testing.   IMPRESSION:  Left medial knee pain with osteoarthritis.   PLAN OF ACTION:  Left unicondylar knee replacement of the medial  compartment by surgeon, Dr. Durene Romans, at [facility] on  October 23, 2008.  Risks and complications were discussed.  Postoperative medications were provided today, including aspirin.     ______________________________  Yetta Glassman. [address], [place]      Madlyn Frankel. Charlann Boxer, M.D.  Electronically Signed    BLM/MEDQ  D:  10/18/2008  T:  10/18/2008  Job:  [id number]

## 2011-02-27 ENCOUNTER — Encounter: Payer: Self-pay | Admitting: Family Medicine

## 2011-02-28 NOTE — Op Note (Signed)
[name], [name]                ACCOUNT NO.:  [id number]   MEDICAL RECORD NO.:  [id number]          PATIENT TYPE:  AMB   LOCATION:  SDC                           FACILITY:  [facility]   PHYSICIAN:  Garrett B. Earlene Plater, M.D.  DATE OF BIRTH:  1966/01/12   DATE OF PROCEDURE:  01/30/2005  DATE OF DISCHARGE:                                 OPERATIVE REPORT   PREOPERATIVE DIAGNOSIS:  Desires tubal sterilization.   POSTOPERATIVE DIAGNOSIS:  Desires tubal sterilization.   PROCEDURE:  Laparoscopic tubal sterilization with bipolar cautery.   SURGEON:  Chester Holstein. Earlene Plater, M.D.   ASSISTANT:  None.   ANESTHESIA:  General.   SPECIMENS:  None.   ESTIMATED BLOOD LOSS:  Minimal.   COMPLICATIONS:  None.   FINDINGS:  An 8 cm pedunculated fundal posterior fibroid, otherwise normal-  appearing uterus, tubes, and ovaries.  Normal-appearing upper abdomen.   INDICATIONS:  The patient desired tubal sterilization in the postpartum  period but was unable to obtain this during her hospitalization, therefore  returns for outpatient laparoscopic tubal.  Permanency, failure rate,  ectopic risk and surgical risk of infection, bleeding, damage to surrounding  organs all discussed.   PROCEDURE:  The patient was taken to the operating room and general  anesthesia obtained.  She was prepped and draped in standard fashion and the  bladder emptied with in-and-out catheter.  A speculum inserted, a single-  tooth tenaculum attached to the anterior lip of the cervix and Hulka  tenaculum attached.   A 10 mm incision placed in the umbilicus, carried sharply to the fascia.  The fascia was divided sharply and elevated.  The posterior sheath and  peritoneum were elevated with Allis clamps and divided sharply.  A  pursestring suture of 0 Vicryl placed around the fascial defect.  A Hasson  cannula inserted and secured.  Pneumoperitoneum obtained with CO2 gas.  Trendelenburg position obtained, the pelvis inspected and the above  findings  noted.  The fibroid appeared to be about 8 cm and pedunculated from the  posterior fundal aspect and was not palpable on pelvic exam previously.  The  tubes and ovaries appeared normal.   The left tube was identified, followed out to its fimbriated end and  cauterized in interrupted burn fashion with bipolar cautery.  Repeated on  the opposite side in a similar fashion.   The scope was removed, gas released and Hasson cannula removed.  The  abdominal wall was elevated and the fascial defect closed with the  previously-placed pursestring suture.  Skin was closed with 4-0 Vicryl.   The patient tolerated the procedure well with no complications.  She was  taken to the recovery room awake and in stable condition.      WBD/MEDQ  D:  01/30/2005  T:  01/30/2005  Job:  [id number]

## 2011-02-28 NOTE — H&P (Signed)
[name], [name]                ACCOUNT NO.:  [id number]   MEDICAL RECORD NO.:  [id number]           PATIENT TYPE:   LOCATION:                                 FACILITY:   PHYSICIAN:  [place] B. Earlene Plater, M.D.  DATE OF BIRTH:  Feb 17, 1966   DATE OF ADMISSION:  12/26/2004  DATE OF DISCHARGE:                                HISTORY & PHYSICAL   ADMISSION DIAGNOSES:  1.  A 41-week intrauterine pregnancy.  2.  Oligohydramnios.   INTENDED PROCEDURE:  Induction of labor.   HISTORY OF PRESENT ILLNESS:  A 44 year old African American female, gravida  1, para 0, 41 weeks and 1 day, presents for induction of labor for post  dates and oligohydramnios, AFI 4.2 on December 24, 2004.   PRENATAL CARE:  [facility], Dr. Earlene Plater, complicated only by advanced  maternal age with normal first trimester serum screening and normal second  trimester AFP.   PAST MEDICAL HISTORY:  None.   PAST SURGICAL HISTORY:  None.   MEDICATIONS:  Prenatal.   ALLERGIES:  PENICILLIN hives.   SOCIAL HISTORY:  No alcohol, tobacco, other drugs.   FAMILY HISTORY:  Noncontributory.   REVIEW OF SYSTEMS:  Otherwise negative.   PHYSICAL EXAMINATION:  VITAL SIGNS:  Blood pressure 140/96, fetal heart rate  140s to 150s, weight 195.  GENERAL:  Alert and oriented, no acute distress.  SKIN:  Warm and dry.  No lesions.  HEART:  Regular rate and rhythm.  LUNGS:  Clear to auscultation.  ABDOMEN:  Liver and spleen normal.  No hernia.  Nontender.  PELVIC:  Cervix is 1-cm dilated, 90% effaced, 0 station, and vertex.   PRENATAL LABS:  Group B strep is positive.  For others see prenatal record.   ASSESSMENT:  1.  Post dates pregnancy with oligohydramnios.  2.  Mild elevation of blood pressure.   The patient is admitted for cervical ripening and induction.  We will check  PIH labs on admission.      WBD/MEDQ  D:  12/26/2004  T:  12/26/2004  Job:  [id number]

## 2011-03-04 ENCOUNTER — Encounter: Payer: Self-pay | Admitting: Family Medicine

## 2011-03-05 ENCOUNTER — Ambulatory Visit: Payer: Self-pay | Admitting: Family Medicine

## 2011-03-07 ENCOUNTER — Other Ambulatory Visit: Payer: Self-pay | Admitting: Family Medicine

## 2011-03-13 ENCOUNTER — Other Ambulatory Visit: Payer: Self-pay | Admitting: Family Medicine

## 2011-04-10 ENCOUNTER — Encounter: Payer: Self-pay | Admitting: Family Medicine

## 2011-05-21 ENCOUNTER — Encounter: Payer: Self-pay | Admitting: Family Medicine

## 2011-05-22 ENCOUNTER — Ambulatory Visit (INDEPENDENT_AMBULATORY_CARE_PROVIDER_SITE_OTHER): Payer: Managed Care, Other (non HMO) | Admitting: Family Medicine

## 2011-05-22 ENCOUNTER — Encounter: Payer: Self-pay | Admitting: Family Medicine

## 2011-05-22 VITALS — BP 130/98 | HR 87 | Resp 16 | Wt 190.1 lb

## 2011-05-22 DIAGNOSIS — R5381 Other malaise: Secondary | ICD-10-CM

## 2011-05-22 DIAGNOSIS — I10 Essential (primary) hypertension: Secondary | ICD-10-CM

## 2011-05-22 DIAGNOSIS — N921 Excessive and frequent menstruation with irregular cycle: Secondary | ICD-10-CM

## 2011-05-22 DIAGNOSIS — N63 Unspecified lump in unspecified breast: Secondary | ICD-10-CM | POA: Insufficient documentation

## 2011-05-22 DIAGNOSIS — Z23 Encounter for immunization: Secondary | ICD-10-CM

## 2011-05-22 DIAGNOSIS — D509 Iron deficiency anemia, unspecified: Secondary | ICD-10-CM

## 2011-05-22 DIAGNOSIS — J45909 Unspecified asthma, uncomplicated: Secondary | ICD-10-CM

## 2011-05-22 DIAGNOSIS — Z13220 Encounter for screening for lipoid disorders: Secondary | ICD-10-CM

## 2011-05-22 DIAGNOSIS — Z139 Encounter for screening, unspecified: Secondary | ICD-10-CM

## 2011-05-22 DIAGNOSIS — N920 Excessive and frequent menstruation with regular cycle: Secondary | ICD-10-CM

## 2011-05-22 DIAGNOSIS — L259 Unspecified contact dermatitis, unspecified cause: Secondary | ICD-10-CM

## 2011-05-22 DIAGNOSIS — R928 Other abnormal and inconclusive findings on diagnostic imaging of breast: Secondary | ICD-10-CM

## 2011-05-22 DIAGNOSIS — R7301 Impaired fasting glucose: Secondary | ICD-10-CM

## 2011-05-22 MED ORDER — METHYLPREDNISOLONE ACETATE 80 MG/ML IJ SUSP
80.0000 mg | Freq: Once | INTRAMUSCULAR | Status: AC
  Administered 2011-05-22: 80 mg via INTRAMUSCULAR

## 2011-05-22 NOTE — Patient Instructions (Addendum)
CPE in end January.  It is important that you exercise regularly at least 30 minutes 5 times a week. If you develop chest pain, have severe difficulty breathing, or feel very tired, stop exercising immediately and seek medical attention  A healthy diet is rich in fruit, vegetables and whole grains. Poultry fish, nuts and beans are a healthy choice for protein rather then red meat. A low sodium diet and drinking 64 ounces of water daily is generally recommended. Oils and sweet should be limited. Carbohydrates especially for those who are diabetic or overweight, should be limited to 34-45 gram per meal. It is important to eat on a regular schedule, at least 3 times daily. Snacks should be primarily fruits, vegetables or nuts.   Mammogram past due ,we will call and schedule    LABWORK  NEEDS TO BE DONE BETWEEN 3 TO 7 DAYS BEFORE YOUR NEXT SCEDULED  VISIT.  THIS WILL IMPROVE THE QUALITY OF YOUR CARE.   You will be referred for pelvic US, morning after 9:30

## 2011-05-23 ENCOUNTER — Other Ambulatory Visit: Payer: Self-pay | Admitting: Family Medicine

## 2011-05-23 DIAGNOSIS — N921 Excessive and frequent menstruation with irregular cycle: Secondary | ICD-10-CM
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
## 2011-05-24 NOTE — Assessment & Plan Note (Signed)
Disabling and worsening , will refer for [place]

## 2011-05-24 NOTE — Assessment & Plan Note (Signed)
Deteriorated prednisone dose pack prescribed

## 2011-05-24 NOTE — Assessment & Plan Note (Signed)
Medication compliance addressed. Commitment to regular exercise and healthy  food choices, with portion control discussed. DASH diet and low fat diet discussed and literature offered. No Changes in medication made at this visit.  

## 2011-05-24 NOTE — Assessment & Plan Note (Signed)
No recent flares, states she still has problems affording symbicort but finds it helpful

## 2011-05-27 ENCOUNTER — Other Ambulatory Visit (HOSPITAL_COMMUNITY): Payer: Managed Care, Other (non HMO)

## 2011-05-27 ENCOUNTER — Other Ambulatory Visit (HOSPITAL_COMMUNITY): Payer: Self-pay | Admitting: Family Medicine

## 2011-05-27 ENCOUNTER — Ambulatory Visit (HOSPITAL_COMMUNITY): Payer: Managed Care, Other (non HMO)

## 2011-05-27 ENCOUNTER — Ambulatory Visit (HOSPITAL_COMMUNITY): Admission: RE | Admit: 2011-05-27 | Payer: Managed Care, Other (non HMO) | Source: Ambulatory Visit

## 2011-05-27 DIAGNOSIS — N921 Excessive and frequent menstruation with irregular cycle: Secondary | ICD-10-CM

## 2011-06-10 ENCOUNTER — Ambulatory Visit (HOSPITAL_COMMUNITY): Payer: Managed Care, Other (non HMO)

## 2011-06-11 ENCOUNTER — Other Ambulatory Visit: Payer: Self-pay | Admitting: Family Medicine

## 2011-06-11 ENCOUNTER — Telehealth: Payer: Self-pay | Admitting: Family Medicine

## 2011-06-11 ENCOUNTER — Ambulatory Visit (HOSPITAL_COMMUNITY): Payer: Managed Care, Other (non HMO)

## 2011-06-12 MED ORDER — ALBUTEROL SULFATE HFA 108 (90 BASE) MCG/ACT IN AERS: 2.0000 | INHALATION_SPRAY | RESPIRATORY_TRACT | Status: DC | PRN

## 2011-06-12 MED ORDER — BUDESONIDE-FORMOTEROL FUMARATE 80-4.5 MCG/ACT IN AERO: 2.0000 | INHALATION_SPRAY | Freq: Two times a day (BID) | RESPIRATORY_TRACT | Status: DC

## 2011-06-12 NOTE — Telephone Encounter (Signed)
Sent her inhalers in. The ones from her list in [facility]

## 2011-06-19 ENCOUNTER — Telehealth: Payer: Self-pay | Admitting: Family Medicine

## 2011-06-19 ENCOUNTER — Ambulatory Visit (HOSPITAL_COMMUNITY): Admission: RE | Admit: 2011-06-19 | Payer: Managed Care, Other (non HMO) | Source: Ambulatory Visit

## 2011-06-20 MED ORDER — PREDNISONE (PAK) 5 MG PO TABS: ORAL_TABLET | ORAL | Status: DC

## 2011-06-20 NOTE — Telephone Encounter (Signed)
Pt having a flare of eczema all over her back. Prednisone is the only thing that clears it up. Sent to [facility]

## 2011-06-20 NOTE — Telephone Encounter (Signed)
If sheis having a flare of asthma or eczema , ok to send in prednisone 5mg  dose pack #21, let her know if condition worsens , she needs evaluation by MD

## 2011-06-25 ENCOUNTER — Ambulatory Visit (HOSPITAL_COMMUNITY): Payer: Managed Care, Other (non HMO)

## 2011-07-11 ENCOUNTER — Ambulatory Visit (INDEPENDENT_AMBULATORY_CARE_PROVIDER_SITE_OTHER): Payer: Managed Care, Other (non HMO) | Admitting: Family Medicine

## 2011-07-11 ENCOUNTER — Encounter: Payer: Self-pay | Admitting: Family Medicine

## 2011-07-11 VITALS — BP 110/82 | HR 98 | Resp 16 | Ht 65.0 in | Wt 189.0 lb

## 2011-07-11 DIAGNOSIS — J45909 Unspecified asthma, uncomplicated: Secondary | ICD-10-CM

## 2011-07-11 DIAGNOSIS — D509 Iron deficiency anemia, unspecified: Secondary | ICD-10-CM

## 2011-07-11 DIAGNOSIS — R5381 Other malaise: Secondary | ICD-10-CM

## 2011-07-11 DIAGNOSIS — E663 Overweight: Secondary | ICD-10-CM

## 2011-07-11 DIAGNOSIS — Z13220 Encounter for screening for lipoid disorders: Secondary | ICD-10-CM

## 2011-07-11 DIAGNOSIS — I10 Essential (primary) hypertension: Secondary | ICD-10-CM

## 2011-07-11 LAB — BASIC METABOLIC PANEL
BUN: 8 mg/dL (ref 6–23)
CO2: 22 mEq/L (ref 19–32)
Calcium: 9.9 mg/dL (ref 8.4–10.5)
Glucose, Bld: 85 mg/dL (ref 70–99)
Potassium: 4.5 mEq/L (ref 3.5–5.3)
Sodium: 139 mEq/L (ref 135–145)

## 2011-07-11 LAB — HEPATIC FUNCTION PANEL
AST: 24 U/L (ref 0–37)
Bilirubin, Direct: 0.1 mg/dL (ref 0.0–0.3)
Indirect Bilirubin: 0.3 mg/dL (ref 0.0–0.9)
Total Bilirubin: 0.4 mg/dL (ref 0.3–1.2)

## 2011-07-11 LAB — LIPID PANEL
Cholesterol: 220 mg/dL — ABNORMAL HIGH (ref 0–200)
Total CHOL/HDL Ratio: 2.9 Ratio

## 2011-07-11 LAB — CBC WITH DIFFERENTIAL/PLATELET
Basophils Relative: 0 % (ref 0–1)
Eosinophils Absolute: 0.3 10*3/uL (ref 0.0–0.7)
Eosinophils Relative: 5 % (ref 0–5)
Lymphs Abs: 1.2 10*3/uL (ref 0.7–4.0)
MCH: 27.9 pg (ref 26.0–34.0)
MCHC: 33.3 g/dL (ref 30.0–36.0)
MCV: 83.8 fL (ref 78.0–100.0)
Neutrophils Relative %: 69 % (ref 43–77)
Platelets: 335 10*3/uL (ref 150–400)
RBC: 4.8 MIL/uL (ref 3.87–5.11)
RDW: 15.6 % — ABNORMAL HIGH (ref 11.5–15.5)

## 2011-07-11 MED ORDER — IPRATROPIUM-ALBUTEROL 0.5-2.5 (3) MG/3ML IN SOLN: 3.0000 mL | Freq: Four times a day (QID) | RESPIRATORY_TRACT | Status: DC | PRN

## 2011-07-11 MED ORDER — BUDESONIDE-FORMOTEROL FUMARATE 160-4.5 MCG/ACT IN AERO: 2.0000 | INHALATION_SPRAY | Freq: Two times a day (BID) | RESPIRATORY_TRACT | Status: DC

## 2011-07-11 NOTE — Patient Instructions (Addendum)
CPE as before.   CBC , fasting lipid, hepatic, chem 7, vit DTSH today.  Dose increase on symbicort.  Neb machine and supplies are prescribed for flares.  TSH today.  Fasting labs for Jan visit  Work excuse to return today

## 2011-07-11 NOTE — Assessment & Plan Note (Signed)
Unchanged. Patient re-educated about  the importance of commitment to a  minimum of 150 minutes of exercise per week. The importance of healthy food choices with portion control discussed. Encouraged to start a food diary, count calories and to consider  joining a support group. Sample diet sheets offered. Goals set by the patient for the next several months.    

## 2011-07-11 NOTE — Assessment & Plan Note (Signed)
Controlled, no change in medication  

## 2011-07-11 NOTE — Assessment & Plan Note (Signed)
Uncontrolled, dose increase on symbicort, also neb machine with duoneb

## 2011-07-11 NOTE — Progress Notes (Signed)
  Subjective:    Patient ID: [name], female    DOB: [birth year], 45 y.o.   MRN: [id number]  HPI Seen in the Ed 2 days ago for asthma attack , reports she only got a breathing treatment, she has had cough with yellow sputum, no fever or chills. States she was out of her inhalers, and as a result had the asthma flare.  Reports feeling better and that se will get her flu vaccine at work where it is free. Denies sinus pressure or sore throat   Review of Systems See HPI Denies recent fever or chills. Denies sinus pressure, nasal congestion, ear pain or sore throat. Denies chest congestion or  productive cough still has occasional wheezing , but much improved. Denies chest pains, palpitations and leg swelling Denies abdominal pain, nausea, vomiting,diarrhea or constipation.   Denies dysuria, frequency, hesitancy or incontinence. Denies joint pain, swelling and limitation in mobility. Denies headaches, seizures, numbness, or tingling. Denies depression, anxiety or insomnia. Denies skin break down or rash.        Objective:   Physical Exam Patient alert and oriented and in no cardiopulmonary distress.  HEENT: No facial asymmetry, EOMI, no sinus tenderness,  oropharynx pink and moist.  Neck supple no adenopathy.  Chest: Decreased though adequate air entry , few wheezes, no crackles  CVS: S1, S2 no murmurs, no S3.  ABD: Soft non tender. Bowel sounds normal.  Ext: No edema  MS: Adequate ROM spine, shoulders, hips and knees.  Skin: Intact, no ulcerations or rash noted.  Psych: Good eye contact, normal affect. Memory intact not anxious or depressed appearing.  CNS: CN 2-12 intact, power, tone and sensation normal throughout.        Assessment & Plan:

## 2011-07-12 ENCOUNTER — Telehealth: Payer: Self-pay | Admitting: *Deleted

## 2011-07-12 NOTE — Telephone Encounter (Signed)
Patient aware of lab results.

## 2011-07-12 NOTE — Telephone Encounter (Signed)
Message copied by Diamantina Monks on Sat Jul 12, 2011  2:27 PM ------      Message from: Syliva Overman MD E      Created: Fri Jul 11, 2011 10:47 PM       pls advise low fat diet , cholesterol slightly high, labs are otherwise good, needs o continue same dose of thyroid medication

## 2011-07-16 ENCOUNTER — Ambulatory Visit (HOSPITAL_COMMUNITY): Payer: Managed Care, Other (non HMO)

## 2011-07-22 ENCOUNTER — Telehealth: Payer: Self-pay | Admitting: Family Medicine

## 2011-08-01 NOTE — Telephone Encounter (Signed)
This message was sent last week. Can she come in for nurse visit for this next week?

## 2011-08-03 ENCOUNTER — Other Ambulatory Visit: Payer: Self-pay | Admitting: Family Medicine

## 2011-08-03 MED ORDER — PREDNISONE (PAK) 5 MG PO TABS: ORAL_TABLET | ORAL | Status: DC

## 2011-08-03 MED ORDER — PREDNISONE (PAK) 5 MG PO TABS: 5.0000 mg | ORAL_TABLET | ORAL | Status: AC

## 2011-08-03 NOTE — Telephone Encounter (Signed)
Nurse visit only for depomedrol 80mg  IM, and I am entering a prednisone dpse pack and will send it also, let her knowx is eczema flare

## 2011-08-04 NOTE — Telephone Encounter (Signed)
Pt coming tomorrow for [place]

## 2011-08-04 NOTE — Telephone Encounter (Signed)
Called patient and left message.

## 2011-08-05 ENCOUNTER — Ambulatory Visit (INDEPENDENT_AMBULATORY_CARE_PROVIDER_SITE_OTHER): Payer: Managed Care, Other (non HMO)

## 2011-08-05 VITALS — BP 122/82 | Wt 192.0 lb

## 2011-08-05 DIAGNOSIS — L309 Dermatitis, unspecified: Secondary | ICD-10-CM

## 2011-08-05 DIAGNOSIS — L259 Unspecified contact dermatitis, unspecified cause: Secondary | ICD-10-CM

## 2011-08-05 MED ORDER — METHYLPREDNISOLONE ACETATE 80 MG/ML IJ SUSP
80.0000 mg | Freq: Once | INTRAMUSCULAR | Status: AC
  Administered 2011-08-05: 80 mg via INTRAMUSCULAR

## 2011-09-09 ENCOUNTER — Telehealth: Payer: Self-pay | Admitting: Family Medicine

## 2011-09-10 NOTE — Telephone Encounter (Signed)
Let pt understand that I believe the FMLA form will cover , since I did not see

## 2011-09-10 NOTE — Telephone Encounter (Signed)
She said she IS still under FMLA but she still needs a signed note for that day. I told her exactly what you said but she said all she needed was a note for 1 day please. Her job will not call here. They are strict

## 2011-09-10 NOTE — Telephone Encounter (Signed)
Luann to call to collect

## 2011-09-10 NOTE — Telephone Encounter (Signed)
pls type a letter stating pt called today reporting asthma flare yesterday preventing her from working. Please grant medical leave under FMLA guidelines for 09/09/2011. I will sign

## 2011-09-10 NOTE — Telephone Encounter (Signed)
let pt know the FMLA form she has should cover, I did not evaluate her and therefore am unable to sign work excuse, her job can get in touch with me about this if needed

## 2011-09-10 NOTE — Telephone Encounter (Signed)
Does she need to come in first?

## 2011-09-10 NOTE — Telephone Encounter (Signed)
Note typed to be signed by Dr Lodema Hong

## 2011-10-21 ENCOUNTER — Telehealth: Payer: Self-pay | Admitting: Family Medicine

## 2011-10-22 NOTE — Telephone Encounter (Signed)
Called patient and family member states he will have her call me back when she comes home

## 2011-10-22 NOTE — Telephone Encounter (Signed)
Left leg has been cramping in the calf area at night. Since she has been working the past few nights she can barely walk up the steps at her job, calf is aching and some redness and also she was having some numbness in that foot. Advised she be evaluated in UC or ER. Patient agreed

## 2011-10-23 ENCOUNTER — Emergency Department (HOSPITAL_COMMUNITY)
Admission: EM | Admit: 2011-10-23 | Discharge: 2011-10-23 | Disposition: A | Discharge status: Home / Self Care | Payer: Managed Care, Other (non HMO) | Attending: Emergency Medicine | Admitting: Emergency Medicine

## 2011-10-23 ENCOUNTER — Encounter (HOSPITAL_COMMUNITY): Payer: Self-pay | Admitting: Emergency Medicine

## 2011-10-23 ENCOUNTER — Emergency Department (HOSPITAL_COMMUNITY): Payer: Managed Care, Other (non HMO)

## 2011-10-23 DIAGNOSIS — M7120 Synovial cyst of popliteal space [Baker], unspecified knee: Secondary | ICD-10-CM

## 2011-10-23 DIAGNOSIS — I10 Essential (primary) hypertension: Secondary | ICD-10-CM | POA: Insufficient documentation

## 2011-10-23 DIAGNOSIS — J45909 Unspecified asthma, uncomplicated: Secondary | ICD-10-CM | POA: Insufficient documentation

## 2011-10-23 HISTORY — DX: Essential (primary) hypertension: I10

## 2011-10-23 LAB — POCT I-STAT, CHEM 8
Creatinine, Ser: 0.9 mg/dL (ref 0.50–1.10)
HCT: 41 % (ref 36.0–46.0)
Hemoglobin: 13.9 g/dL (ref 12.0–15.0)
Sodium: 142 mEq/L (ref 135–145)
TCO2: 24 mmol/L (ref 0–100)

## 2011-10-23 LAB — CBC
MCH: 27.6 pg (ref 26.0–34.0)
MCHC: 33.2 g/dL (ref 30.0–36.0)
Platelets: 343 10*3/uL (ref 150–400)
RDW: 15.4 % (ref 11.5–15.5)

## 2011-10-23 MED ORDER — OXYCODONE-ACETAMINOPHEN 5-325 MG PO TABS
1.0000 | ORAL_TABLET | Freq: Once | ORAL | Status: AC
  Administered 2011-10-23: 1 via ORAL
  Filled 2011-10-23: qty 1

## 2011-10-23 NOTE — ED Notes (Signed)
Pt c/o left calf pain x 2 weeks with foot swelling/numbness.

## 2011-10-23 NOTE — ED Notes (Signed)
Phoned [place]. Order did not cross because it defaulted to Cardiology. New order put in. [place] tech aware.

## 2011-10-23 NOTE — ED Provider Notes (Addendum)
History     CSN: [id number]  Arrival date & time 10/23/11  1324   First MD Initiated Contact with Patient 10/23/11 1441      Chief Complaint  Patient presents with  . Leg Pain    (Consider location/radiation/quality/duration/timing/severity/associated sxs/prior treatment) HPI Patient presents with pain in her left calf. She states the pain began 2 weeks ago. She's also noted increased cramping in her left calf at night over the past 2 weeks. She's had no trauma or injuries to her leg. She has no swelling in her legs. She has no history of DVT or PE, no history of recent surgery or injuries, no recent travel. She does take blood pressure medications but has had no change in her doses recently. She has no chest pain or shortness of breath. Pain is described as dull aching and intermittently cramping. There no other associated systemic symptoms. There no alleviating or modifying factors.  Past Medical History  Diagnosis Date  . Hypertension   . Asthma     Past Surgical History  Procedure Date  . Knee surgery   . Back surgery     No family history on file.  History  Substance Use Topics  . Smoking status: Never Smoker   . Smokeless tobacco: Not on file  . Alcohol Use: No    OB History    Grav Para Term Preterm Abortions TAB SAB Ect Mult Living                  Review of Systems ROS reviewed and otherwise negative except for mentioned in HPI  Allergies  Penicillins  Home Medications   Current Outpatient Rx  Name Route Sig Dispense Refill  . ALBUTEROL SULFATE HFA 108 (90 BASE) MCG/ACT IN AERS Inhalation Inhale 2 puffs into the lungs every 4 (four) hours as needed for wheezing. 1 Inhaler 2  . AMLODIPINE BESYLATE 10 MG PO TABS  TAKE 1 TABLET BY MOUTH ONCE DAILY. MAY TAKE TWO 5MG  TABLETS DAILYUNTIL GONE 90 tablet 1  . BUDESONIDE-FORMOTEROL FUMARATE 160-4.5 MCG/ACT IN AERO Inhalation Inhale 2 puffs into the lungs 2 (two) times daily. 1 Inhaler 12    Dose increase  effective 07/11/2011    BP 112/80  Pulse 84  Temp(Src) 98.2 F (36.8 C) (Oral)  Resp 16  Ht 5\' 3"  (1.6 m)  Wt 183 lb (83.008 kg)  BMI 32.42 kg/m2  SpO2 97% Vitals reviewed Physical Exam Physical Examination: General appearance - alert, well appearing, and in no distress Mental status - alert, oriented to person, place, and time Mouth - mucous membranes moist, pharynx normal without lesions Chest - clear to auscultation, no wheezes, rales or rhonchi, symmetric air entry Heart - normal rate, regular rhythm, normal S1, S2, no murmurs, rubs, clicks or gallops Abdomen - soft, nontender, nondistended, no masses or organomegaly Neurological - alert, oriented, normal speech, no focal findings or movement disorder noted Musculoskeletal - no joint tenderness, deformity or swelling, mild ttp over medial calf on left Extremities - peripheral pulses normal, no pedal edema, no clubbing or cyanosis Skin - normal coloration and turgor, scattered excematous lesions on bilateral legs- chronic per patient ED Course  Procedures (including critical care time)   Labs Reviewed  CBC  POCT I-STAT, CHEM 8  I-STAT, CHEM 8   US Venous Img Lower Unilateral Left  10/23/2011  *RADIOLOGY REPORT*  Clinical Data: Left lower extremity pain and swelling.  LEFT LOWER EXTREMITY VENOUS DUPLEX ULTRASOUND  Technique:  Gray-scale sonography with graded  compression, as well as color Doppler and duplex ultrasound, were performed to evaluate the deep venous system of the lower extremity from the level of the common femoral vein through the popliteal and proximal calf veins. Spectral Doppler was utilized to evaluate flow at rest and with distal augmentation maneuvers.  Comparison:  None  Findings: There is patent flow in the common femoral, profunda femoral, superficial femoral and popliteal veins.  These vessels were completely compressible and demonstrate increased flow with augmentation.  Dissecting Baker's cyst is noted.   IMPRESSION:  1.  Negative venous Doppler examination for deep venous thrombosis. 2.  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  Original Report Authenticated By: P. Loralie Champagne, M.D.     1. Baker's cyst       MDM  The patient presenting with pain in her left calf. She has no risk factors for DVT or PE. Venous duplex ultrasound was obtained which shows no DVT however she does have a Baker's cyst which is dissecting into her calf somewhat. Patient was advised of these results. Recommend treatment with ibuprofen. She was discharged with strict return precautions and is agreeable with this plan.        Ethelda Chick, MD 10/23/11 1733  Ethelda Chick, MD 10/23/11 [phone]

## 2011-10-27 ENCOUNTER — Encounter: Payer: Self-pay | Admitting: Family Medicine

## 2011-10-27 ENCOUNTER — Ambulatory Visit (INDEPENDENT_AMBULATORY_CARE_PROVIDER_SITE_OTHER): Payer: Managed Care, Other (non HMO) | Admitting: Family Medicine

## 2011-10-27 VITALS — BP 126/78 | HR 98 | Resp 18 | Ht 65.0 in | Wt 187.0 lb

## 2011-10-27 DIAGNOSIS — M7120 Synovial cyst of popliteal space [Baker], unspecified knee: Secondary | ICD-10-CM

## 2011-10-27 MED ORDER — HYDROCODONE-ACETAMINOPHEN 5-500 MG PO TABS: 1.0000 | ORAL_TABLET | ORAL | Status: DC | PRN

## 2011-10-27 NOTE — Patient Instructions (Signed)
We will set you up for an appt with the orthopedic surgeon- [facility] Use the hydrocodone for severe pain Continue the Ibuprofen

## 2011-10-28 ENCOUNTER — Encounter: Payer: Self-pay | Admitting: Family Medicine

## 2011-10-28 DIAGNOSIS — M7120 Synovial cyst of popliteal space [Baker], unspecified knee: Secondary | ICD-10-CM | POA: Insufficient documentation

## 2011-10-28 NOTE — Progress Notes (Signed)
  Subjective:    Patient ID: Lanelle Bal, female    DOB: 08-07-1966, 46 y.o.   MRN: [id number]  HPI Patient presents followup in the ED for left calf pain. She had ultrasound done which showed dissecting Baker's cyst. She was started on ibuprofen and sent for followup with PCP. Patient continues to have pain when walking. 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 Therefore she is unable to work at this time until cleared. She has more pain with climbing steps and also when she extends her knee. She denies any locking, catching of the knee. She does note there is a mild fullness to her  left knee compared to the right She denies any specific injury to the knee or trauma to the knee.   Review of Systems - per above  MSK- + joint pain, calf pain, +swelling Skin- no bruising    Objective:   Physical Exam GEN- NAD, alert and oriented Neuro/Gait- antalgic MSK-well healed scars seen in left knee, fullness in left popliteal, left calf TTP, pain with extension at knee,, knee joint ligaments stable Skin- no erythema, no ecchymosis noted      Assessment & Plan:

## 2011-10-28 NOTE — Assessment & Plan Note (Signed)
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 Will refer to orthopedic surgery for intervention. She has significant pain. Short-term course of Vicodin given she'll also continue anti-inflammatories.

## 2011-11-07 ENCOUNTER — Telehealth: Payer: Self-pay | Admitting: Family Medicine

## 2011-11-07 NOTE — Telephone Encounter (Signed)
Needs dermatology to prescribe treatment for her face, needs to contact derm and let them know her concerns,pls let her know

## 2011-11-10 ENCOUNTER — Encounter: Payer: Managed Care, Other (non HMO) | Admitting: Family Medicine

## 2011-11-10 NOTE — Telephone Encounter (Signed)
Called and spoke with pt and she is aware and will contact dermatologist.

## 2011-11-11 ENCOUNTER — Encounter: Payer: Managed Care, Other (non HMO) | Admitting: Family Medicine

## 2011-11-13 ENCOUNTER — Encounter: Payer: Managed Care, Other (non HMO) | Admitting: Family Medicine

## 2011-12-19 ENCOUNTER — Telehealth: Payer: Self-pay | Admitting: Family Medicine

## 2011-12-19 ENCOUNTER — Other Ambulatory Visit (HOSPITAL_COMMUNITY)
Admission: RE | Admit: 2011-12-19 | Discharge: 2011-12-19 | Disposition: A | Discharge status: Home / Self Care | Payer: Managed Care, Other (non HMO) | Source: Ambulatory Visit | Attending: Family Medicine | Admitting: Family Medicine

## 2011-12-19 ENCOUNTER — Encounter: Payer: Self-pay | Admitting: Family Medicine

## 2011-12-19 ENCOUNTER — Other Ambulatory Visit: Payer: Self-pay

## 2011-12-19 ENCOUNTER — Other Ambulatory Visit: Payer: Self-pay | Admitting: Family Medicine

## 2011-12-19 ENCOUNTER — Ambulatory Visit (INDEPENDENT_AMBULATORY_CARE_PROVIDER_SITE_OTHER): Payer: Managed Care, Other (non HMO) | Admitting: Family Medicine

## 2011-12-19 VITALS — BP 130/86 | HR 99 | Resp 18 | Ht 65.0 in | Wt 182.1 lb

## 2011-12-19 DIAGNOSIS — E663 Overweight: Secondary | ICD-10-CM

## 2011-12-19 DIAGNOSIS — Z01419 Encounter for gynecological examination (general) (routine) without abnormal findings: Secondary | ICD-10-CM | POA: Insufficient documentation

## 2011-12-19 DIAGNOSIS — B3749 Other urogenital candidiasis: Secondary | ICD-10-CM

## 2011-12-19 DIAGNOSIS — Z0000 Encounter for general adult medical examination without abnormal findings: Secondary | ICD-10-CM

## 2011-12-19 DIAGNOSIS — J45909 Unspecified asthma, uncomplicated: Secondary | ICD-10-CM

## 2011-12-19 DIAGNOSIS — N760 Acute vaginitis: Secondary | ICD-10-CM | POA: Insufficient documentation

## 2011-12-19 DIAGNOSIS — B3789 Other sites of candidiasis: Secondary | ICD-10-CM

## 2011-12-19 LAB — POC HEMOCCULT BLD/STL (OFFICE/1-CARD/DIAGNOSTIC): Fecal Occult Blood, POC: NEGATIVE

## 2011-12-19 MED ORDER — ALBUTEROL SULFATE HFA 108 (90 BASE) MCG/ACT IN AERS: 2.0000 | INHALATION_SPRAY | RESPIRATORY_TRACT | Status: DC | PRN

## 2011-12-19 MED ORDER — PREDNISONE (PAK) 5 MG PO TABS: 5.0000 mg | ORAL_TABLET | ORAL | Status: DC

## 2011-12-19 MED ORDER — AMLODIPINE BESYLATE 10 MG PO TABS: 10.0000 mg | ORAL_TABLET | Freq: Every day | ORAL | Status: DC

## 2011-12-19 MED ORDER — NYSTATIN 100000 UNIT/GM EX POWD: CUTANEOUS | Status: DC

## 2011-12-19 MED ORDER — METHYLPREDNISOLONE ACETATE 80 MG/ML IJ SUSP
80.0000 mg | Freq: Once | INTRAMUSCULAR | Status: AC
  Administered 2011-12-19: 80 mg via INTRAMUSCULAR

## 2011-12-19 NOTE — Assessment & Plan Note (Signed)
Uncontrolled, daily ise of albuterol x 2 weeks, unable to afford symbicort, will request samples, depo medrol 80 mg and oral prednisone

## 2011-12-19 NOTE — Patient Instructions (Signed)
F/u in 5.5 month.  You will get depo medrol 80 mg IM in the office and prednisone is sent in also.    Antifungal powder is prescribed for use under your breasts as needed.  We will request symbicort samples for you, and call when they are available  Please follow a low fat diet.  It is important that you exercise regularly at least 30 minutes 5 times a week. If you develop chest pain, have severe difficulty breathing, or feel very tired, stop exercising immediately and seek medical attention    A healthy diet is rich in fruit, vegetables and whole grains. Poultry fish, nuts and beans are a healthy choice for protein rather then red meat. A low sodium diet and drinking 64 ounces of water daily is generally recommended. Oils and sweet should be limited. Carbohydrates especially for those who are diabetic or overweight, should be limited to 34-45 gram per meal.

## 2011-12-19 NOTE — Telephone Encounter (Signed)
Med refilled along with albuterol inhaler requested at appointment.

## 2011-12-20 LAB — GC/CHLAMYDIA PROBE AMP, GENITAL: GC Probe Amp, Genital: NEGATIVE

## 2011-12-20 LAB — WET PREP BY MOLECULAR PROBE: Candida species: NEGATIVE

## 2011-12-20 MED ORDER — METRONIDAZOLE 500 MG PO TABS: 500.0000 mg | ORAL_TABLET | Freq: Two times a day (BID) | ORAL | Status: AC

## 2011-12-20 NOTE — Assessment & Plan Note (Signed)
Specimens sent, will treat for Bv based on result

## 2011-12-20 NOTE — Progress Notes (Signed)
  Subjective:    Patient ID: [name], female    DOB: 1966-01-27, 46 y.o.   MRN: [id number]  HPI The PT is here for annual exam and re-evaluation of chronic medical conditions, medication management and review of any available recent lab and radiology data.  Preventive health is updated, specifically  Cancer screening and Immunization.   Questions or concerns regarding consultations or procedures which the PT has had in the interim are  Addressed.Her eczema has significantly worsened, has appt at tertiary center this next week. Last week had increased chest congestion and a cold, since then has had increased wheeze , cough and dependence on the albuterol, also states she is unable to afford symbicort, which helps her the most C/o malodorous vaginal d/c       Review of Systems See HPI Denies recent fever or chills. Denies sinus pressure, nasal congestion, ear pain or sore throat.  Denies chest pains, palpitations and leg swelling Denies abdominal pain, nausea, vomiting,diarrhea or constipation.   Denies dysuria, frequency, hesitancy or incontinence. Denies joint pain, swelling and limitation in mobility. Denies headaches, seizures, numbness, or tingling. Denies depression, does have some anxiety over worsening skin condition and also insomnia due too excessive itching      Objective:   Physical Exam Pleasant well nourished female, alert and oriented x 3, in no cardio-pulmonary distress. Afebrile. HEENT No facial trauma or asymetry. Sinuses non tender.  EOMI, PERTL, fundoscopic exam is normal, no hemorhage or exudate.  External ears normal, tympanic membranes clear. Oropharynx moist, no exudate, fair  dentition. Neck: supple, no adenopathy,JVD or thyromegaly.No bruits.  Chest: Decreased air entry, bilateral wheezes Non tender to palpation  Breast: No asymetry,no masses. No nipple discharge or inversion. No axillary or supraclavicular adenopathy  Cardiovascular  system; Heart sounds normal,  S1 and  S2 ,no S3.  No murmur, or thrill. Apical beat not displaced Peripheral pulses normal.  Abdomen: Soft, non tender, no organomegaly or masses. No bruits. Bowel sounds normal. No guarding, tenderness or rebound.  Rectal:  No mass. Guaiac negative stool.  GU: External genitalia normal. No lesions. Vaginal canal normal.Malodorous  discharge. Uterus slightly enlarged, no adnexal masses, no cervical motion or adnexal tenderness.  Musculoskeletal exam: Full ROM of spine, hips , shoulders and knees. No deformity ,swelling or crepitus noted. No muscle wasting or atrophy.   Neurologic: Cranial nerves 2 to 12 intact. Power, tone ,sensation and reflexes normal throughout. No disturbance in gait. No tremor.  Skin: Multiple hyperpigmented maculopapular lesions, scratch marks, dry skin Psych; Anxious. Judgement and concentration normal        Assessment & Plan:

## 2011-12-20 NOTE — Assessment & Plan Note (Signed)
Improved. Pt applauded on succesful weight loss through lifestyle change, and encouraged to continue same. Weight loss goal set for the next several months.  

## 2012-01-05 ENCOUNTER — Telehealth: Payer: Self-pay | Admitting: Family Medicine

## 2012-01-05 NOTE — Telephone Encounter (Signed)
Letter written that pt reports that she was seen in ed. And that she is excused from work on 3/24 and may return on 3/25 per her report.

## 2012-01-05 NOTE — Telephone Encounter (Signed)
Ok but write on note that missed work due to reported asthma flare on 3/24. Since she has no office eval I am unable to state she can return on 3/25, without restrictions, she really needs evaluation for that,  Otherwise note can state she reports being able to return without restrictions on 3/25, i that is acceptable to her company, ok with me.she does have an FMLA form for asthma, so that may work, I guess we can try and see. The main thing is the letter should be clear this is all based on patient reporting and not on a current clinical evaluation

## 2012-01-05 NOTE — Telephone Encounter (Signed)
Pt states that she has fmla papers on file but needs a note for work saying that she missed work on 3/24 due to asthma and that she can return to work 3/25 with no restrictions.  Is this ok?

## 2012-01-13 ENCOUNTER — Telehealth: Payer: Self-pay | Admitting: Family Medicine

## 2012-01-13 DIAGNOSIS — B3789 Other sites of candidiasis: Secondary | ICD-10-CM

## 2012-01-13 MED ORDER — NYSTATIN 100000 UNIT/GM EX POWD: CUTANEOUS | Status: AC

## 2012-01-13 NOTE — Telephone Encounter (Signed)
Aware that powder was sent in

## 2012-01-15 ENCOUNTER — Ambulatory Visit (INDEPENDENT_AMBULATORY_CARE_PROVIDER_SITE_OTHER): Payer: Managed Care, Other (non HMO) | Admitting: Family Medicine

## 2012-01-15 ENCOUNTER — Encounter: Payer: Self-pay | Admitting: Family Medicine

## 2012-01-15 VITALS — BP 134/86 | HR 83 | Resp 18 | Ht 65.0 in | Wt 183.0 lb

## 2012-01-15 DIAGNOSIS — I10 Essential (primary) hypertension: Secondary | ICD-10-CM

## 2012-01-15 DIAGNOSIS — L259 Unspecified contact dermatitis, unspecified cause: Secondary | ICD-10-CM

## 2012-01-15 MED ORDER — PREDNISONE (PAK) 5 MG PO TABS: 5.0000 mg | ORAL_TABLET | ORAL | Status: DC

## 2012-01-15 MED ORDER — METHYLPREDNISOLONE ACETATE 80 MG/ML IJ SUSP
80.0000 mg | Freq: Once | INTRAMUSCULAR | Status: AC
  Administered 2012-01-15: 80 mg via INTRAMUSCULAR

## 2012-01-15 NOTE — Progress Notes (Signed)
  Subjective:    Patient ID: [name], female    DOB: [birth year], 46 y.o.   MRN: [id number]  HPI 4 day h/o pruritic rash around neck , no difficulty wheezing or with swallowing, no new toiletries or detergents.No fever , chills.   Review of Systems See HPI Denies recent fever or chills. Denies sinus pressure, nasal congestion, ear pain or sore throat. Denies chest congestion, productive cough or wheezing. Denies chest pains, palpitations and leg swelling Denies depression, anxiety or insomnia.       Objective:   Physical Exam Patient alert and oriented and in no cardiopulmonary distress.  HEENT: No facial asymmetry, EOMI, no sinus tenderness,  oropharynx pink and moist.  Neck supple no adenopathy.  Chest: Clear to auscultation bilaterally.  CVS: S1, S2 no murmurs, no S3.  ABD: Soft non tender.   Ext: No edema  Skin: erythematous macular rash around neck.  CNS: CN 2-12 intact, power,  normal throughout.        Assessment & Plan:

## 2012-01-15 NOTE — Patient Instructions (Signed)
F/u as before  You are being treated for uncontrolled allergic reaction on the skin around your neck.  You will get depo medrol 80 mg in the office, and prednisone is sent to your pharmacy

## 2012-01-16 ENCOUNTER — Telehealth: Payer: Self-pay | Admitting: Family Medicine

## 2012-01-16 DIAGNOSIS — L259 Unspecified contact dermatitis, unspecified cause: Secondary | ICD-10-CM

## 2012-01-16 MED ORDER — PREDNISONE (PAK) 5 MG PO TABS: 5.0000 mg | ORAL_TABLET | ORAL | Status: DC

## 2012-01-16 NOTE — Telephone Encounter (Signed)
Patient aware.

## 2012-01-17 NOTE — Assessment & Plan Note (Signed)
Acute falre around neck , will treat aggressively with systemic steroids

## 2012-01-17 NOTE — Assessment & Plan Note (Signed)
Controlled, no change in medication  

## 2012-03-25 ENCOUNTER — Encounter: Payer: Self-pay | Admitting: Family Medicine

## 2012-03-25 ENCOUNTER — Ambulatory Visit (INDEPENDENT_AMBULATORY_CARE_PROVIDER_SITE_OTHER): Payer: Managed Care, Other (non HMO) | Admitting: Family Medicine

## 2012-03-25 VITALS — BP 138/80 | HR 106 | Resp 18 | Ht 65.0 in | Wt 178.0 lb

## 2012-03-25 DIAGNOSIS — E663 Overweight: Secondary | ICD-10-CM

## 2012-03-25 DIAGNOSIS — L259 Unspecified contact dermatitis, unspecified cause: Secondary | ICD-10-CM

## 2012-03-25 DIAGNOSIS — J45909 Unspecified asthma, uncomplicated: Secondary | ICD-10-CM

## 2012-03-25 DIAGNOSIS — I10 Essential (primary) hypertension: Secondary | ICD-10-CM

## 2012-03-25 DIAGNOSIS — E079 Disorder of thyroid, unspecified: Secondary | ICD-10-CM

## 2012-03-25 MED ORDER — HYDROXYZINE HCL 50 MG PO TABS: 50.0000 mg | ORAL_TABLET | Freq: Three times a day (TID) | ORAL | Status: DC | PRN

## 2012-03-25 MED ORDER — METHYLPREDNISOLONE ACETATE 80 MG/ML IJ SUSP
80.0000 mg | Freq: Once | INTRAMUSCULAR | Status: AC
  Administered 2012-03-25: 80 mg via INTRAMUSCULAR

## 2012-03-25 MED ORDER — PREDNISONE (PAK) 5 MG PO TABS: 5.0000 mg | ORAL_TABLET | ORAL | Status: DC

## 2012-03-25 NOTE — Patient Instructions (Addendum)
F/u in 3 month  Fasting lipid, chem 7, TSH, vitamin D and HBA1C in 3 months just before follow up.  You still need your mammogram   You will get depo medrol in the office today, and also prednisone is sent to your pharmacy and atarax for itch  Do not drive with the atartax, it has a sleepy side effect

## 2012-03-25 NOTE — Progress Notes (Signed)
  Subjective:    Patient ID: [name], female    DOB: 1965/12/17, 46 y.o.   MRN: [id number]  HPI The PT is here for follow up and re-evaluation of chronic medical conditions, medication management and review of any available recent lab and radiology data.  Preventive health is updated, specifically  Cancer screening and Immunization.   Questions or concerns regarding consultations or procedures which the PT has had in the interim are  Addressed.Has been seen in the dermatology clinic at [facility] and is making progress as far as her eczema is  Concerned. She is to have specific skin testing to determine whehter or not the allergen is at her job The PT denies any adverse reactions to current medications since the last visit.  She is here because a recent flare of rash and itch affecting her hands and back, she is en route to the beach and wants some medication      Review of Systems See HPI Denies recent fever or chills. Denies sinus pressure, nasal congestion, ear pain or sore throat. Denies chest congestion, productive cough or wheezing. Denies chest pains, palpitations and leg swelling Denies abdominal pain, nausea, vomiting,diarrhea or constipation.   Denies dysuria, frequency, hesitancy or incontinence. Denies joint pain, swelling and limitation in mobility. Denies headaches, seizures, numbness, or tingling. Denies depression, anxiety or insomnia.        Objective:   Physical Exam Patient alert and oriented and in no cardiopulmonary distress.  HEENT: No facial asymmetry, EOMI, no sinus tenderness,  oropharynx pink and moist.  Neck supple no adenopathy.  Chest: Clear to auscultation bilaterally.  CVS: S1, S2 no murmurs, no S3.  ABD: Soft non tender. Bowel sounds normal.  Ext: No edema  MS: Adequate ROM spine, shoulders, hips and knees.  Skin:Improvement in eczema on face and scalp and upper extremities. Flare in hands and back with skin breakdown, currently taking  antibiotics  Psych: Good eye contact, normal affect. Memory intact not anxious or depressed appearing.  CNS: CN 2-12 intact, power, tone and sensation normal throughout.        Assessment & Plan:

## 2012-04-04 NOTE — Assessment & Plan Note (Signed)
Controlled, no change in medication  

## 2012-04-04 NOTE — Assessment & Plan Note (Signed)
Controlled, no change in medication No recent flare 

## 2012-04-04 NOTE — Assessment & Plan Note (Signed)
Improved but currently experiencing acute flare in palms and on back, steroid in office and dose pack prescribed

## 2012-05-03 ENCOUNTER — Other Ambulatory Visit: Payer: Self-pay | Admitting: Family Medicine

## 2012-05-07 ENCOUNTER — Other Ambulatory Visit: Payer: Self-pay

## 2012-05-07 DIAGNOSIS — L259 Unspecified contact dermatitis, unspecified cause: Secondary | ICD-10-CM

## 2012-05-07 MED ORDER — HYDROXYZINE HCL 50 MG PO TABS: 50.0000 mg | ORAL_TABLET | Freq: Three times a day (TID) | ORAL | Status: DC | PRN

## 2012-06-07 ENCOUNTER — Telehealth: Payer: Self-pay

## 2012-06-09 NOTE — Telephone Encounter (Signed)
No ED visit on record, pls find out from pt where she went, sent for the record then , and only then will I sign  A note , thanks

## 2012-06-12 ENCOUNTER — Other Ambulatory Visit: Payer: Self-pay | Admitting: Family Medicine

## 2012-06-25 ENCOUNTER — Other Ambulatory Visit: Payer: Self-pay | Admitting: Family Medicine

## 2012-06-28 ENCOUNTER — Telehealth: Payer: Self-pay | Admitting: Family Medicine

## 2012-06-28 NOTE — Telephone Encounter (Signed)
Also needs a note for work last night she was out inhaler please send to [employer]

## 2012-07-02 NOTE — Telephone Encounter (Signed)
Called and left message for pt to return call.  

## 2012-08-15 ENCOUNTER — Other Ambulatory Visit: Payer: Self-pay | Admitting: Family Medicine

## 2012-08-20 ENCOUNTER — Encounter: Payer: Self-pay | Admitting: Family Medicine

## 2012-08-20 ENCOUNTER — Ambulatory Visit (INDEPENDENT_AMBULATORY_CARE_PROVIDER_SITE_OTHER): Payer: Managed Care, Other (non HMO) | Admitting: Family Medicine

## 2012-08-20 VITALS — BP 130/80 | HR 93 | Resp 16 | Ht 65.0 in | Wt 184.0 lb

## 2012-08-20 DIAGNOSIS — I10 Essential (primary) hypertension: Secondary | ICD-10-CM

## 2012-08-20 DIAGNOSIS — R059 Cough, unspecified: Secondary | ICD-10-CM

## 2012-08-20 DIAGNOSIS — R05 Cough: Secondary | ICD-10-CM

## 2012-08-20 DIAGNOSIS — L259 Unspecified contact dermatitis, unspecified cause: Secondary | ICD-10-CM

## 2012-08-20 DIAGNOSIS — J45909 Unspecified asthma, uncomplicated: Secondary | ICD-10-CM

## 2012-08-20 MED ORDER — PREDNISONE (PAK) 5 MG PO TABS: 5.0000 mg | ORAL_TABLET | ORAL | Status: DC

## 2012-08-20 MED ORDER — IPRATROPIUM BROMIDE 0.02 % IN SOLN: 0.5000 mg | RESPIRATORY_TRACT | Status: DC

## 2012-08-20 MED ORDER — FLUCONAZOLE 150 MG PO TABS: ORAL_TABLET | ORAL | Status: DC

## 2012-08-20 MED ORDER — BUDESONIDE-FORMOTEROL FUMARATE 160-4.5 MCG/ACT IN AERO: 2.0000 | INHALATION_SPRAY | Freq: Two times a day (BID) | RESPIRATORY_TRACT | Status: DC

## 2012-08-20 MED ORDER — ALBUTEROL SULFATE (2.5 MG/3ML) 0.083% IN NEBU: 2.5000 mg | INHALATION_SOLUTION | RESPIRATORY_TRACT | Status: DC

## 2012-08-20 MED ORDER — SULFAMETHOXAZOLE-TRIMETHOPRIM 800-160 MG PO TABS: 1.0000 | ORAL_TABLET | Freq: Two times a day (BID) | ORAL | Status: AC

## 2012-08-20 MED ORDER — AMLODIPINE BESYLATE 10 MG PO TABS: ORAL_TABLET | ORAL | Status: DC

## 2012-08-20 MED ORDER — AMLODIPINE BESYLATE 10 MG PO TABS: 10.0000 mg | ORAL_TABLET | Freq: Every day | ORAL | Status: DC

## 2012-08-20 MED ORDER — METHYLPREDNISOLONE ACETATE 80 MG/ML IJ SUSP
80.0000 mg | Freq: Once | INTRAMUSCULAR | Status: AC
  Administered 2012-08-20: 80 mg via INTRAMUSCULAR

## 2012-08-20 MED ORDER — ALBUTEROL SULFATE (2.5 MG/3ML) 0.083% IN NEBU
2.5000 mg | INHALATION_SOLUTION | Freq: Once | RESPIRATORY_TRACT | Status: AC
  Administered 2012-08-20: 2.5 mg via RESPIRATORY_TRACT

## 2012-08-20 MED ORDER — IPRATROPIUM BROMIDE 0.02 % IN SOLN
0.5000 mg | Freq: Once | RESPIRATORY_TRACT | Status: AC
  Administered 2012-08-20: 0.5 mg via RESPIRATORY_TRACT

## 2012-08-20 NOTE — Assessment & Plan Note (Signed)
Currently uncontrolled with flare, depo medro and neb treatment in office, meds to pharmacy

## 2012-08-20 NOTE — Assessment & Plan Note (Signed)
Controlled, no change in medication DASH diet and commitment to daily physical activity for a minimum of 30 minutes discussed and encouraged, as a part of hypertension management. The importance of attaining a healthy weight is also discussed.  

## 2012-08-20 NOTE — Progress Notes (Signed)
  Subjective:    Patient ID: [name], female    DOB: Nov 26, 1965, 46 y.o.   MRN: [id number]  HPI 2 week h/o increased cough and chest congestion with yellow sputum, no fever or chills, out of symbicort, and unable to get before next week  SKin much improved, using topical preps per derm at [facility]  States her blood pressure med has increased in price which is a challenge   Review of Systems See HPI Denies recent fever or chills. Denies sinus pressure, nasal congestion, ear pain or sore throat.  Denies chest pains, palpitations and leg swelling Denies abdominal pain, nausea, vomiting,diarrhea or constipation.   Denies dysuria, frequency, hesitancy or incontinence. Denies joint pain, swelling and limitation in mobility. Denies headaches, seizures, numbness, or tingling. Denies depression, anxiety or insomnia.       Objective:   Physical Exam Patient alert and oriented and in no cardiopulmonary distress.  HEENT: No facial asymmetry, EOMI, no sinus tenderness,  oropharynx pink and moist.  Neck supple no adenopathy.  Chest:decreased air entry, bilateral wheezes and scattered crackles  CVS: S1, S2 no murmurs, no S3.  ABD: Soft non tender. Bowel sounds normal.  Ext: No edema  MS: Adequate ROM spine, shoulders, hips and knees.  Skin: Intact, no ulcerations or rash noted.  Psych: Good eye contact, normal affect. Memory intact not anxious or depressed appearing.  CNS: CN 2-12 intact, power, tone and sensation normal throughout.        Assessment & Plan:

## 2012-08-20 NOTE — Assessment & Plan Note (Signed)
Marked improvement , treated through derm at [facility]

## 2012-08-20 NOTE — Patient Instructions (Addendum)
CPE 3/10 /2014, please call if you need me before.  You are being treated for asthma flare, depo medrol 80mg  IM in office and breathing treatment, also prednisone dose pack, septra and fluconazole have been sent in  You will get script for your blood pressure med, see if costs less at another pharmacy  Skin is much improved

## 2012-09-22 ENCOUNTER — Telehealth: Payer: Self-pay | Admitting: Family Medicine

## 2012-09-22 ENCOUNTER — Other Ambulatory Visit: Payer: Self-pay

## 2012-09-22 DIAGNOSIS — L259 Unspecified contact dermatitis, unspecified cause: Secondary | ICD-10-CM

## 2012-09-22 MED ORDER — HYDROXYZINE HCL 50 MG PO TABS: 50.0000 mg | ORAL_TABLET | Freq: Three times a day (TID) | ORAL | Status: AC | PRN

## 2012-09-22 NOTE — Telephone Encounter (Signed)
Med reordered.

## 2012-09-22 NOTE — Telephone Encounter (Signed)
Im not sure of what medicine the patient is referring to.

## 2012-10-24 ENCOUNTER — Other Ambulatory Visit: Payer: Self-pay | Admitting: Family Medicine

## 2012-10-26 ENCOUNTER — Other Ambulatory Visit: Payer: Self-pay

## 2012-10-26 ENCOUNTER — Telehealth: Payer: Self-pay | Admitting: Family Medicine

## 2012-10-26 MED ORDER — ALBUTEROL SULFATE HFA 108 (90 BASE) MCG/ACT IN AERS: 2.0000 | INHALATION_SPRAY | RESPIRATORY_TRACT | Status: DC | PRN

## 2012-10-26 NOTE — Telephone Encounter (Signed)
Med refilled.

## 2012-12-22 ENCOUNTER — Telehealth: Payer: Self-pay

## 2012-12-22 NOTE — Telephone Encounter (Signed)
Done and faxed

## 2012-12-22 NOTE — Telephone Encounter (Signed)
You can write a note stating, pt was sent home on FMLA due to medication side effect , she is cleared to return to work without restrictions.

## 2012-12-22 NOTE — Telephone Encounter (Signed)
She has FMLA for asthma  [place] that she is currently on prednisone to clear up her eczema before she goes to derm next week for patch test. She was on her way to work and used inhaler and took her prednisone on empty stomach and when she got to work she felt dizzy and lightheaded. They sent her home and said for her to call and let derm and PCP know what happened and get a letter stating that we were told what went on and that we agree with her being sent home and faxed in to her job by tomorrow. (She feels fine now) FAX- [phone].

## 2012-12-23 ENCOUNTER — Telehealth: Payer: Self-pay | Admitting: Family Medicine

## 2012-12-23 NOTE — Telephone Encounter (Signed)
This was faxed to the number provided

## 2012-12-26 ENCOUNTER — Other Ambulatory Visit: Payer: Self-pay | Admitting: Family Medicine

## 2013-01-19 ENCOUNTER — Encounter: Payer: Self-pay | Admitting: Family Medicine

## 2013-01-19 ENCOUNTER — Telehealth: Payer: Self-pay | Admitting: Family Medicine

## 2013-01-19 ENCOUNTER — Ambulatory Visit (INDEPENDENT_AMBULATORY_CARE_PROVIDER_SITE_OTHER): Payer: Managed Care, Other (non HMO) | Admitting: Family Medicine

## 2013-01-19 VITALS — BP 112/84 | HR 93 | Resp 16 | Ht 65.0 in | Wt 192.0 lb

## 2013-01-19 DIAGNOSIS — R7301 Impaired fasting glucose: Secondary | ICD-10-CM

## 2013-01-19 DIAGNOSIS — I10 Essential (primary) hypertension: Secondary | ICD-10-CM

## 2013-01-19 DIAGNOSIS — D509 Iron deficiency anemia, unspecified: Secondary | ICD-10-CM

## 2013-01-19 DIAGNOSIS — Z79899 Other long term (current) drug therapy: Secondary | ICD-10-CM

## 2013-01-19 DIAGNOSIS — Z1321 Encounter for screening for nutritional disorder: Secondary | ICD-10-CM

## 2013-01-19 DIAGNOSIS — J45909 Unspecified asthma, uncomplicated: Secondary | ICD-10-CM

## 2013-01-19 DIAGNOSIS — Z13228 Encounter for screening for other metabolic disorders: Secondary | ICD-10-CM

## 2013-01-19 DIAGNOSIS — J309 Allergic rhinitis, unspecified: Secondary | ICD-10-CM

## 2013-01-19 DIAGNOSIS — R5381 Other malaise: Secondary | ICD-10-CM

## 2013-01-19 DIAGNOSIS — E663 Overweight: Secondary | ICD-10-CM

## 2013-01-19 DIAGNOSIS — L259 Unspecified contact dermatitis, unspecified cause: Secondary | ICD-10-CM

## 2013-01-19 MED ORDER — PREDNISONE (PAK) 5 MG PO TABS: 5.0000 mg | ORAL_TABLET | ORAL | Status: DC

## 2013-01-19 MED ORDER — ALBUTEROL SULFATE (2.5 MG/3ML) 0.083% IN NEBU
2.5000 mg | INHALATION_SOLUTION | Freq: Once | RESPIRATORY_TRACT | Status: AC
  Administered 2013-01-19: 2.5 mg via RESPIRATORY_TRACT

## 2013-01-19 MED ORDER — METHYLPREDNISOLONE ACETATE 80 MG/ML IJ SUSP
80.0000 mg | Freq: Once | INTRAMUSCULAR | Status: AC
  Administered 2013-01-19: 80 mg via INTRAMUSCULAR

## 2013-01-19 MED ORDER — BUDESONIDE-FORMOTEROL FUMARATE 160-4.5 MCG/ACT IN AERO: 2.0000 | INHALATION_SPRAY | Freq: Two times a day (BID) | RESPIRATORY_TRACT | Status: DC

## 2013-01-19 MED ORDER — IPRATROPIUM-ALBUTEROL 0.5-2.5 (3) MG/3ML IN SOLN: RESPIRATORY_TRACT | Status: DC

## 2013-01-19 MED ORDER — IPRATROPIUM BROMIDE 0.02 % IN SOLN
0.5000 mg | Freq: Once | RESPIRATORY_TRACT | Status: AC
  Administered 2013-01-19: 0.5 mg via RESPIRATORY_TRACT

## 2013-01-19 MED ORDER — MONTELUKAST SODIUM 10 MG PO TABS: 10.0000 mg | ORAL_TABLET | Freq: Every day | ORAL | Status: DC

## 2013-01-19 NOTE — Assessment & Plan Note (Signed)
Uncontrolled, acute flare x 1 day. Under treated due to pt struggling financially with med cost, despite being insured and working full time. I again stressed the importance of daily preventive med, and have persionally handed her a script for a neb machine and neb solution for use every 8 hrs as needed

## 2013-01-19 NOTE — Assessment & Plan Note (Signed)
Uncontrolled, singulair prescribed, educated re the [employer] of daily use. Does not ant nasal spray

## 2013-01-19 NOTE — Patient Instructions (Addendum)
F/u in 4 month  pLEASE start using symbicort EVERY day, also singulair to reduce allergy symptoms and asthma.Keep the proventil with you for use in emergency situations outside of home  You are also prescribed a breathing machine, with nebulizing solution  For use if you have a flare. Use every 8 hours, if needed. If you are not getting better you will need to come to the office or go to the emergency room for treatment. Asthma is a very serious illness, and if not treated quickly and aggressively, may cause death.  Please schedule mammogram , 3 years overdue.  Fasting labs please no later than next week Monday , as we discussed, we need blood work to see why you are so tired  CBC, lipid, cmp, tSH, HBA1C and Vit D  Work excuse for 4/8 to return 01/19/2013  Pls commit to daily exercise to improve overall health andf promote weight loss

## 2013-01-19 NOTE — Progress Notes (Signed)
  Subjective:    Patient ID: [name], female    DOB: [birth year], 47 y.o.   MRN: [id number]  HPI Increased allergy symptoms, runny nose, sneezing and watery eyes x 2 days, also started coughing and wheezing since past 2 days, last night was not good, she was out a t a game, since then cough , wheeze and shortness  Of breath. Denies fever, chills, green sputum or green drinage Did not go to work last night due to wheeze, has no neb machine a home, has only proventil inhaler, constantly challenged by med cost.Called in requesting a 'breathing treatment". States all of Feb she was on prednisone fr her skin and this helped her asthma alot Continues to follow dermatology at [facility], being treated as eczema, good response to meds, which she does not have, again c/o increased cost of new med which she is unsure that she "needs" Labs and cancer screening need to be updated   Review of Systems See HPI  Denies chest pains, palpitations and leg swelling Denies abdominal pain, nausea, vomiting,diarrhea or constipation.   Denies dysuria, frequency, hesitancy or incontinence. Denies joint pain, swelling and limitation in mobility. Denies headaches, seizures, numbness, or tingling. Denies depression, anxiety or insomnia. States skin condition much improved.        Objective:   Physical Exam  Patient alert and oriented and in no cardiopulmonary distress.  HEENT: No facial asymmetry, EOMI, no sinus tenderness,  oropharynx pink and moist.  Neck supple no adenopathy.erythema and edema of nasal mucosa, conjunctiva mildly erythematous with watery eyes  Chest: decreased air entry with wheeze  CVS: S1, S2 no murmurs, no S3.  ABD: Soft non tender. Bowel sounds normal.  Ext: No edema  MS: Adequate ROM spine, shoulders, hips and knees.  Skin: Intact, eczema not as inflamed , not as extensive as in the past.  Psych: Good eye contact, normal affect. Memory intact not anxious or depressed  appearing.  CNS: CN 2-12 intact, power, tone and sensation normal throughout.       Assessment & Plan:

## 2013-01-19 NOTE — Assessment & Plan Note (Signed)
Deteriorated. Patient re-educated about  the importance of commitment to a  minimum of 150 minutes of exercise per week. The importance of healthy food choices with portion control discussed. Encouraged to start a food diary, count calories and to consider  joining a support group. Sample diet sheets offered. Goals set by the patient for the next several months.    

## 2013-01-19 NOTE — Telephone Encounter (Signed)
Luann aware

## 2013-01-19 NOTE — Telephone Encounter (Signed)
I spoke with pt she is to be worked in at [employer] today pls

## 2013-01-19 NOTE — Assessment & Plan Note (Signed)
Controlled, no change in medication DASH diet and commitment to daily physical activity for a minimum of 30 minutes discussed and encouraged, as a part of hypertension management. The importance of attaining a healthy weight is also discussed.  

## 2013-01-19 NOTE — Assessment & Plan Note (Signed)
Improved, continue derm management through [facility]

## 2013-02-08 ENCOUNTER — Other Ambulatory Visit: Payer: Self-pay

## 2013-02-08 ENCOUNTER — Telehealth: Payer: Self-pay | Admitting: Family Medicine

## 2013-02-08 MED ORDER — ALBUTEROL SULFATE HFA 108 (90 BASE) MCG/ACT IN AERS: 2.0000 | INHALATION_SPRAY | RESPIRATORY_TRACT | Status: DC | PRN

## 2013-02-08 NOTE — Telephone Encounter (Signed)
Med refilled.

## 2013-02-08 NOTE — Telephone Encounter (Signed)
I am assuming the problem is her asthma, not the sinuses. A note can be written stating she called the office today reporting a flare of her asthma last night if she states it was an asthma flare If it is due to sinus infection then will need OV. Pls ensure she has all inhalers at her pharmacy, in other words pls check top see if she needs anything sent in also

## 2013-02-08 NOTE — Telephone Encounter (Signed)
Is patient able to get note even though she was not seen? She has FMLA on file.

## 2013-02-11 NOTE — Telephone Encounter (Signed)
Called and left msg for patient to clarify symptoms.

## 2013-02-25 NOTE — Telephone Encounter (Signed)
Patient never returned call  

## 2013-04-24 ENCOUNTER — Other Ambulatory Visit: Payer: Self-pay | Admitting: Family Medicine

## 2013-06-08 ENCOUNTER — Telehealth: Payer: Self-pay | Admitting: Family Medicine

## 2013-06-08 MED ORDER — HYDROXYZINE HCL 10 MG PO TABS: ORAL_TABLET | ORAL | Status: DC

## 2013-06-08 NOTE — Telephone Encounter (Signed)
Please advise.  Nothing on current med list.

## 2013-06-08 NOTE — Telephone Encounter (Signed)
pls erx hydroxyzine 10mg  one to 2 tablets at bedtime , as needed, for itching #40 refill zero

## 2013-06-08 NOTE — Telephone Encounter (Signed)
Med sent in to requested pharmacy.

## 2013-06-09 ENCOUNTER — Telehealth: Payer: Self-pay | Admitting: Family Medicine

## 2013-06-09 DIAGNOSIS — J45909 Unspecified asthma, uncomplicated: Secondary | ICD-10-CM

## 2013-06-09 MED ORDER — PREDNISONE (PAK) 5 MG PO TABS: 5.0000 mg | ORAL_TABLET | ORAL | Status: DC

## 2013-06-09 NOTE — Telephone Encounter (Signed)
Do you advise that patient should be seen at urgent care?

## 2013-06-09 NOTE — Telephone Encounter (Signed)
Med sent.

## 2013-06-09 NOTE — Telephone Encounter (Signed)
Send in 1 refill of pred dose pack pls, she does have severe eczema. Ask her if she is still seeing derm at [facility]

## 2013-06-23 ENCOUNTER — Telehealth: Payer: Self-pay | Admitting: Family Medicine

## 2013-06-23 ENCOUNTER — Other Ambulatory Visit: Payer: Self-pay

## 2013-06-23 MED ORDER — ALBUTEROL SULFATE HFA 108 (90 BASE) MCG/ACT IN AERS: 2.0000 | INHALATION_SPRAY | RESPIRATORY_TRACT | Status: DC | PRN

## 2013-06-23 NOTE — Telephone Encounter (Signed)
Please advise 

## 2013-06-23 NOTE — Telephone Encounter (Signed)
Spoke with patient and she is requesting a refill on albuterol inhaler.   Will send refill but she would also like for someone to followup with pharmacy to see if she is receiving symbicort at coupon price.

## 2013-07-14 ENCOUNTER — Other Ambulatory Visit: Payer: Self-pay

## 2013-07-14 MED ORDER — HYDROXYZINE HCL 10 MG PO TABS: ORAL_TABLET | ORAL | Status: DC

## 2013-07-22 ENCOUNTER — Other Ambulatory Visit: Payer: Self-pay

## 2013-07-22 DIAGNOSIS — J45909 Unspecified asthma, uncomplicated: Secondary | ICD-10-CM

## 2013-07-22 MED ORDER — BUDESONIDE-FORMOTEROL FUMARATE 160-4.5 MCG/ACT IN AERO: 2.0000 | INHALATION_SPRAY | Freq: Two times a day (BID) | RESPIRATORY_TRACT | Status: DC

## 2013-07-22 NOTE — Telephone Encounter (Signed)
Pharmacy is now closed permanently.  Patient will need to collect new savings card.

## 2013-07-22 NOTE — Telephone Encounter (Signed)
Called and left message for patient to collect new savings card as her pharmacy is now closed.  *Rx and savings card in basket in front for patient collection*

## 2013-08-02 ENCOUNTER — Telehealth: Payer: Self-pay | Admitting: *Deleted

## 2013-08-02 MED ORDER — HYDROXYZINE HCL 10 MG PO TABS: ORAL_TABLET | ORAL | Status: DC

## 2013-08-02 NOTE — Telephone Encounter (Signed)
Can get depo medrol 80mg  Im, BUT needs to make and keep appt and have labs that were ordered and cancelled in October before any further "call in treatments"also needs the flu vaccine

## 2013-08-02 NOTE — Telephone Encounter (Signed)
Back broke out bad with the eczema and wants the prednisone injection that she normally gets at the office. Please advise

## 2013-08-02 NOTE — Telephone Encounter (Signed)
Pt called wanting to know if she can get her allergy shot

## 2013-08-02 NOTE — Telephone Encounter (Signed)
Patient aware. Coming in tomorrow am

## 2013-08-02 NOTE — Telephone Encounter (Signed)
Patient wants to come in to get a depo medrol injection for her allergies

## 2013-08-03 ENCOUNTER — Encounter (INDEPENDENT_AMBULATORY_CARE_PROVIDER_SITE_OTHER): Payer: Self-pay

## 2013-08-03 ENCOUNTER — Ambulatory Visit (INDEPENDENT_AMBULATORY_CARE_PROVIDER_SITE_OTHER): Payer: Managed Care, Other (non HMO)

## 2013-08-03 VITALS — BP 130/64 | Wt 188.1 lb

## 2013-08-03 DIAGNOSIS — Z23 Encounter for immunization: Secondary | ICD-10-CM

## 2013-08-03 DIAGNOSIS — L259 Unspecified contact dermatitis, unspecified cause: Secondary | ICD-10-CM

## 2013-08-03 MED ORDER — METHYLPREDNISOLONE ACETATE 80 MG/ML IJ SUSP
80.0000 mg | Freq: Once | INTRAMUSCULAR | Status: AC
  Administered 2013-08-03: 80 mg via INTRAMUSCULAR

## 2013-08-03 NOTE — Progress Notes (Signed)
Patient in for depo-medrol 80mg  injection.  Injection given IM in the right gluteal.  No sign or symptom of adverse reaction.  No voiced complaints.

## 2013-08-30 ENCOUNTER — Telehealth: Payer: Self-pay | Admitting: Family Medicine

## 2013-08-30 MED ORDER — ALBUTEROL SULFATE HFA 108 (90 BASE) MCG/ACT IN AERS: 2.0000 | INHALATION_SPRAY | RESPIRATORY_TRACT | Status: DC | PRN

## 2013-08-30 MED ORDER — HYDROXYZINE HCL 10 MG PO TABS: ORAL_TABLET | ORAL | Status: DC

## 2013-08-30 NOTE — Telephone Encounter (Signed)
Changed pharmacies in the chart and sent in 30 DAYS ONLY. Patient has made appt and must keep it to get anymore fills

## 2013-09-05 ENCOUNTER — Ambulatory Visit: Payer: Managed Care, Other (non HMO) | Admitting: Family Medicine

## 2013-09-06 ENCOUNTER — Other Ambulatory Visit: Payer: Self-pay | Admitting: Family Medicine

## 2013-09-06 ENCOUNTER — Telehealth: Payer: Self-pay | Admitting: Family Medicine

## 2013-09-06 MED ORDER — PREDNISONE (PAK) 5 MG PO TABS: 5.0000 mg | ORAL_TABLET | ORAL | Status: DC

## 2013-09-06 NOTE — Telephone Encounter (Signed)
Skin broken out pls send in prednisone. Advised pt to contact during regular work hrs, [facility] and she is aaware. I also s reminded her of the NEEd to come into the office

## 2013-09-20 ENCOUNTER — Other Ambulatory Visit: Payer: Self-pay | Admitting: Family Medicine

## 2013-09-28 ENCOUNTER — Ambulatory Visit (INDEPENDENT_AMBULATORY_CARE_PROVIDER_SITE_OTHER): Payer: Managed Care, Other (non HMO) | Admitting: Family Medicine

## 2013-09-28 ENCOUNTER — Encounter (INDEPENDENT_AMBULATORY_CARE_PROVIDER_SITE_OTHER): Payer: Self-pay

## 2013-09-28 ENCOUNTER — Encounter: Payer: Self-pay | Admitting: Family Medicine

## 2013-09-28 VITALS — BP 134/82 | HR 86 | Resp 18 | Ht 65.0 in | Wt 186.1 lb

## 2013-09-28 DIAGNOSIS — E663 Overweight: Secondary | ICD-10-CM

## 2013-09-28 DIAGNOSIS — M545 Low back pain, unspecified: Secondary | ICD-10-CM | POA: Insufficient documentation

## 2013-09-28 DIAGNOSIS — L259 Unspecified contact dermatitis, unspecified cause: Secondary | ICD-10-CM

## 2013-09-28 DIAGNOSIS — R350 Frequency of micturition: Secondary | ICD-10-CM

## 2013-09-28 DIAGNOSIS — R946 Abnormal results of thyroid function studies: Secondary | ICD-10-CM

## 2013-09-28 DIAGNOSIS — J45909 Unspecified asthma, uncomplicated: Secondary | ICD-10-CM

## 2013-09-28 DIAGNOSIS — M549 Dorsalgia, unspecified: Secondary | ICD-10-CM

## 2013-09-28 DIAGNOSIS — I10 Essential (primary) hypertension: Secondary | ICD-10-CM

## 2013-09-28 DIAGNOSIS — D509 Iron deficiency anemia, unspecified: Secondary | ICD-10-CM

## 2013-09-28 LAB — POCT URINALYSIS DIPSTICK
Bilirubin, UA: NEGATIVE
Blood, UA: NEGATIVE
Ketones, UA: NEGATIVE
Nitrite, UA: NEGATIVE
Protein, UA: NEGATIVE
pH, UA: 5.5

## 2013-09-28 MED ORDER — CYCLOBENZAPRINE HCL 10 MG PO TABS: ORAL_TABLET | ORAL | Status: AC

## 2013-09-28 MED ORDER — KETOROLAC TROMETHAMINE 60 MG/2ML IM SOLN
60.0000 mg | Freq: Once | INTRAMUSCULAR | Status: AC
  Administered 2013-09-28: 60 mg via INTRAMUSCULAR

## 2013-09-28 MED ORDER — PREDNISONE (PAK) 5 MG PO TABS: 5.0000 mg | ORAL_TABLET | ORAL | Status: DC

## 2013-09-28 MED ORDER — METHYLPREDNISOLONE ACETATE 80 MG/ML IJ SUSP
80.0000 mg | Freq: Once | INTRAMUSCULAR | Status: AC
  Administered 2013-09-28: 80 mg via INTRAMUSCULAR

## 2013-09-28 MED ORDER — IBUPROFEN 800 MG PO TABS: 800.0000 mg | ORAL_TABLET | Freq: Three times a day (TID) | ORAL | Status: DC | PRN

## 2013-09-28 NOTE — Assessment & Plan Note (Signed)
Symptomatic mildly but Ua is negative pt reassured no infection

## 2013-09-28 NOTE — Assessment & Plan Note (Signed)
Deteriorated. Patient re-educated about  the importance of commitment to a  minimum of 150 minutes of exercise per week. The importance of healthy food choices with portion control discussed. Encouraged to start a food diary, count calories and to consider  joining a support group. Sample diet sheets offered. Goals set by the patient for the next several months.    

## 2013-09-28 NOTE — Patient Instructions (Signed)
cPE end March, call if you need me before  You do not have a urinary infection.  Back pain is muscular, toradol and depo medrol in offfice, anti inflammatories, and muscle relaxant prescribed  Fasting labs  Mid January, CBC, lipid, cmp , TSH, HBA1c and vit D

## 2013-09-28 NOTE — Progress Notes (Signed)
   Subjective:    Patient ID: [name], female    DOB: 09/21/1966, 47 y.o.   MRN: [id number]  HPI 3 day h/o acute right sided flank poain, uncertain if getting a UTi. Denies frequency or burning. No fever, chills or nausea Pain is aggravated by turning and bending Asthma has been fairly stable , esp since changingg to day shift over the past year , few asthma flares. Skin continues to flare up has appt with derm at [facility] in next 2 weeks, recently called in for oral steroids   Review of Systems See HPI Denies recent fever or chills. Denies sinus pressure, nasal congestion, ear pain or sore throat. Denies chest congestion, productive cough or wheezing. Denies chest pains, palpitations and leg swelling Denies abdominal pain, nausea, vomiting,diarrhea or constipation.   . Denies headaches, seizures, numbness, or tingling. Denies depression, anxiety or insomnia. .        Objective:   Physical Exam  Patient alert and oriented and in no cardiopulmonary distress.  HEENT: No facial asymmetry, EOMI, no sinus tenderness,  oropharynx pink and moist.  Neck supple no adenopathy.  Chest: Clear to auscultation bilaterally.  CVS: S1, S2 no murmurs, no S3.  ABD: Soft non tender. Bowel sounds normal.No renal angle or suprapubic tenderness  Ext: No edema  MS: Adequate ROM spine, shoulders, hips and knees.Decreased ROM thoraco lumbar spine due to pain  Skin: Intact, eczema on forearm  Psych: Good eye contact, normal affect. Memory intact not anxious or depressed appearing.  CNS: CN 2-12 intact, power, tone and sensation normal throughout.       Assessment & Plan:

## 2013-09-28 NOTE — Assessment & Plan Note (Signed)
No acute flare currently 

## 2013-09-28 NOTE — Assessment & Plan Note (Signed)
Controlled, no change in medication Improved per pt report

## 2013-09-28 NOTE — Assessment & Plan Note (Signed)
Controlled, no change in medication DASH diet and commitment to daily physical activity for a minimum of 30 minutes discussed and encouraged, as a part of hypertension management. The importance of attaining a healthy weight is also discussed.  

## 2013-09-28 NOTE — Assessment & Plan Note (Signed)
Acute flare , anti inflammatories and muscle relaxant

## 2013-10-17 ENCOUNTER — Other Ambulatory Visit: Payer: Self-pay | Admitting: Family Medicine

## 2013-11-04 ENCOUNTER — Other Ambulatory Visit: Payer: Self-pay | Admitting: Family Medicine

## 2013-11-09 ENCOUNTER — Other Ambulatory Visit: Payer: Self-pay | Admitting: Family Medicine

## 2013-12-19 ENCOUNTER — Other Ambulatory Visit: Payer: Self-pay | Admitting: Family Medicine

## 2013-12-26 ENCOUNTER — Other Ambulatory Visit: Payer: Self-pay | Admitting: Family Medicine

## 2013-12-28 ENCOUNTER — Other Ambulatory Visit: Payer: Self-pay

## 2013-12-28 MED ORDER — AMLODIPINE BESYLATE 10 MG PO TABS: ORAL_TABLET | ORAL | Status: DC

## 2014-01-09 ENCOUNTER — Telehealth: Payer: Self-pay

## 2014-01-09 ENCOUNTER — Other Ambulatory Visit: Payer: Self-pay

## 2014-01-09 DIAGNOSIS — M549 Dorsalgia, unspecified: Secondary | ICD-10-CM

## 2014-01-09 DIAGNOSIS — J45909 Unspecified asthma, uncomplicated: Secondary | ICD-10-CM

## 2014-01-09 MED ORDER — ALBUTEROL SULFATE HFA 108 (90 BASE) MCG/ACT IN AERS: INHALATION_SPRAY | RESPIRATORY_TRACT | Status: DC

## 2014-01-09 MED ORDER — BUDESONIDE-FORMOTEROL FUMARATE 160-4.5 MCG/ACT IN AERO: 2.0000 | INHALATION_SPRAY | Freq: Two times a day (BID) | RESPIRATORY_TRACT | Status: DC

## 2014-01-09 MED ORDER — MONTELUKAST SODIUM 10 MG PO TABS: ORAL_TABLET | ORAL | Status: DC

## 2014-01-09 MED ORDER — PREDNISONE (PAK) 5 MG PO TABS: 5.0000 mg | ORAL_TABLET | ORAL | Status: DC

## 2014-01-09 NOTE — Telephone Encounter (Signed)
Symbicort, Ventolin, and Singulair refilled.  Please advise if prednisone can be sent for asthma flare.

## 2014-01-09 NOTE — Addendum Note (Signed)
Addended by: Denman George B on: 01/09/2014 11:49 AM   Modules accepted: Orders

## 2014-01-09 NOTE — Telephone Encounter (Signed)
pls send in prednisone dose pack x 6 dayys and let her know

## 2014-01-09 NOTE — Telephone Encounter (Signed)
Called left message notifying patient of response

## 2014-01-11 ENCOUNTER — Other Ambulatory Visit: Payer: Self-pay | Admitting: Family Medicine

## 2014-01-12 ENCOUNTER — Telehealth: Payer: Self-pay | Admitting: Family Medicine

## 2014-01-12 MED ORDER — HYDROXYZINE HCL 10 MG PO TABS: ORAL_TABLET | ORAL | Status: DC

## 2014-01-12 NOTE — Telephone Encounter (Signed)
Patient aware.

## 2014-01-30 ENCOUNTER — Other Ambulatory Visit: Payer: Self-pay | Admitting: Family Medicine

## 2014-02-17 ENCOUNTER — Other Ambulatory Visit: Payer: Self-pay | Admitting: Family Medicine

## 2014-03-14 ENCOUNTER — Other Ambulatory Visit: Payer: Self-pay | Admitting: Family Medicine

## 2014-03-30 ENCOUNTER — Telehealth: Payer: Self-pay

## 2014-03-30 NOTE — Telephone Encounter (Signed)
pls send prednisone 5 mg dose pack x 6 days only. She needs to be seen in the office  And have labs done in July or August, none since 2012 , and she keeps calling for meds, explain and let her knwo pls She gets her eczema treated by dermatology

## 2014-03-31 MED ORDER — PREDNISONE 5 MG PO KIT: PACK | ORAL | Status: DC

## 2014-03-31 NOTE — Telephone Encounter (Signed)
Patient aware.  Will make appt.

## 2014-03-31 NOTE — Addendum Note (Signed)
Addended by: Denman George B on: 03/31/2014 02:22 PM   Modules accepted: Orders

## 2014-04-06 ENCOUNTER — Other Ambulatory Visit: Payer: Self-pay | Admitting: Family Medicine

## 2014-04-08 ENCOUNTER — Other Ambulatory Visit: Payer: Self-pay | Admitting: Family Medicine

## 2014-05-01 ENCOUNTER — Other Ambulatory Visit: Payer: Self-pay | Admitting: Family Medicine

## 2014-05-03 ENCOUNTER — Encounter (HOSPITAL_COMMUNITY): Payer: Self-pay | Admitting: Emergency Medicine

## 2014-05-03 ENCOUNTER — Emergency Department (HOSPITAL_COMMUNITY)
Admission: EM | Admit: 2014-05-03 | Discharge: 2014-05-03 | Disposition: A | Discharge status: Home / Self Care | Payer: Managed Care, Other (non HMO) | Attending: Emergency Medicine | Admitting: Emergency Medicine

## 2014-05-03 DIAGNOSIS — IMO0002 Reserved for concepts with insufficient information to code with codable children: Secondary | ICD-10-CM | POA: Insufficient documentation

## 2014-05-03 DIAGNOSIS — Z791 Long term (current) use of non-steroidal anti-inflammatories (NSAID): Secondary | ICD-10-CM | POA: Insufficient documentation

## 2014-05-03 DIAGNOSIS — J45901 Unspecified asthma with (acute) exacerbation: Secondary | ICD-10-CM | POA: Insufficient documentation

## 2014-05-03 DIAGNOSIS — I10 Essential (primary) hypertension: Secondary | ICD-10-CM | POA: Insufficient documentation

## 2014-05-03 DIAGNOSIS — L240 Irritant contact dermatitis due to detergents: Secondary | ICD-10-CM | POA: Insufficient documentation

## 2014-05-03 DIAGNOSIS — L309 Dermatitis, unspecified: Secondary | ICD-10-CM

## 2014-05-03 DIAGNOSIS — Z79899 Other long term (current) drug therapy: Secondary | ICD-10-CM | POA: Insufficient documentation

## 2014-05-03 DIAGNOSIS — Z880 Allergy status to penicillin: Secondary | ICD-10-CM | POA: Insufficient documentation

## 2014-05-03 MED ORDER — PREDNISONE 10 MG PO TABS: 20.0000 mg | ORAL_TABLET | Freq: Every day | ORAL | Status: DC

## 2014-05-03 MED ORDER — PREDNISONE 50 MG PO TABS
60.0000 mg | ORAL_TABLET | Freq: Once | ORAL | Status: AC
  Administered 2014-05-03: 60 mg via ORAL
  Filled 2014-05-03 (×2): qty 1

## 2014-05-03 NOTE — ED Notes (Addendum)
States she has a history of eczema. States last night her ace began to break out and this is different from her normal rash, c/o facial itching unrelieved by vistaril

## 2014-05-03 NOTE — ED Notes (Signed)
Patient with no complaints at this time. Respirations even and unlabored. Skin warm/dry. Discharge instructions reviewed with patient at this time. Patient given opportunity to voice concerns/ask questions. Patient discharged at this time and left Emergency Department with steady gait.   

## 2014-05-03 NOTE — ED Notes (Signed)
MD at bedside. 

## 2014-05-03 NOTE — ED Provider Notes (Signed)
CSN: [id number]     Arrival date & time 05/03/14  1559 History   This chart was scribed for Virgel Manifold, MD by Lowella Petties, ED Scribe. The patient was seen in room APA19/APA19. Patient's care was started at 5:44 PM.   Chief Complaint  Patient presents with  . Allergic Reaction   The history is provided by the patient. No language interpreter was used.   HPI Comments: [name] is a 48 y.o. female who presents to the Emergency Department complaining of an allergic reaction onset yesterday. She states that she has an itching and burning rash on her face and neck. She reports changing her detergent last Saturday. She reports taking Vistaril with minimal relief. She reports a history of asthma, and states that her breathing is worse right now. She reports using an inhaler at home for her asthma. She states that she has baseline eczema on her legs and around her forehead for which she has not been taking medication for the past few days. She reports that she is switching to a new PCP.    Past Medical History  Diagnosis Date  . Hypertension   . Asthma 2010  . Eczema since childhoosd    flare since 2011   Past Surgical History  Procedure Laterality Date  . Knee surgery    . Back surgery     No family history on file. History  Substance Use Topics  . Smoking status: Never Smoker   . Smokeless tobacco: Not on file  . Alcohol Use: No   OB History   Grav Para Term Preterm Abortions TAB SAB Ect Mult Living                 Review of Systems  All other systems reviewed and are negative.     Allergies  Penicillins  Home Medications   Prior to Admission medications   Medication Sig Start Date End Date Taking? Authorizing Provider  albuterol (VENTOLIN HFA) 108 (90 BASE) MCG/ACT inhaler INHALE 2 PUFFS INTO THE LUNGS EVERY 4 (FOUR) HOURS AS NEEDED FOR WHEEZING. 01/09/14   Fayrene Helper, MD  amLODipine (NORVASC) 10 MG tablet TAKE 1 TABLET (10 MG TOTAL) BY MOUTH DAILY.  12/28/13   Fayrene Helper, MD  budesonide-formoterol [facility]) 160-4.5 MCG/ACT inhaler Inhale 2 puffs into the lungs 2 (two) times daily. 01/09/14   Fayrene Helper, MD  hydrOXYzine (ATARAX/VISTARIL) 10 MG tablet TAKE 1 TO 2 TABLETS BY MOUTH AT BEDTIME AS NEEDED FOR ITCHING    Fayrene Helper, MD  ibuprofen (ADVIL,MOTRIN) 800 MG tablet Take 1 tablet (800 mg total) by mouth every 8 (eight) hours as needed. 09/28/13   Fayrene Helper, MD  ipratropium-albuterol (DUONEB) 0.5-2.5 (3) MG/3ML SOLN One inhalation every 8 hours as needed, for severe wheezing 01/19/13 01/19/14  Fayrene Helper, MD  montelukast (SINGULAIR) 10 MG tablet TAKE 1 TABLET (10 MG TOTAL) BY MOUTH DAILY.    Fayrene Helper, MD  predniSONE (STERAPRED UNI-PAK) 5 MG TABS tablet Take 1 tablet (5 mg total) by mouth as directed. 01/09/14   Fayrene Helper, MD  PredniSONE 5 MG KIT As directed 03/31/14   Fayrene Helper, MD   Triage Vitals: BP 114/90  Pulse 85  Temp(Src) 98.7 F (37.1 C) (Oral)  Resp 16  Ht _0  (1.626 m)  Wt 190 lb (86.183 kg)  BMI 32.60 kg/m2  SpO2 96%  LMP 04/17/2014 Physical Exam  Nursing note and vitals reviewed. Constitutional:  She is oriented to person, place, and time. She appears well-developed and well-nourished. No distress.  HENT:  Head: Normocephalic and atraumatic.  Eyes: Conjunctivae and EOM are normal.  Neck: Neck supple. No tracheal deviation present.  Cardiovascular: Normal rate.   Pulmonary/Chest: Effort normal. No respiratory distress. She has wheezes (Spiratory wheezing bilaterally).  Musculoskeletal: Normal range of motion.  Neurological: She is alert and oriented to person, place, and time.  Skin: Skin is warm and dry.  Diffuse maculopapular rash with some excoriations on the upper face and back. No mucus membrane involvement.   Psychiatric: She has a normal mood and affect. Her behavior is normal.    ED Course  Procedures (including critical care time) DIAGNOSTIC  STUDIES: Oxygen Saturation is 96% on room air, normal by my interpretation.    COORDINATION OF CARE: 5:50 PM-Discussed treatment plan which includes steroid medication with pt at bedside and pt agreed to plan.   Labs Review Labs Reviewed - No data to display  Imaging Review No results found.   EKG Interpretation None      MDM   Final diagnoses:  Dermatitis    47yF with rash to face and upper trunk. No mucus membrane involvement. No new exposures aside from a new laundry detergent. Some mild wheezing on exam, but hx of asthma. No increased WOB. No fever or chills. No GI complaints. Plan course of steroids.   I personally preformed the services scribed in my presence. The recorded information has been reviewed is accurate. Virgel Manifold, MD.    Virgel Manifold, MD 05/04/14 1600

## 2014-05-03 NOTE — ED Notes (Signed)
Patient states only new environmental change was Tide sensitive detergent on Saturday. No other new changes. 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 C/o itching and burning to rash.

## 2014-05-09 ENCOUNTER — Ambulatory Visit: Payer: Managed Care, Other (non HMO) | Admitting: Family Medicine

## 2014-06-03 ENCOUNTER — Other Ambulatory Visit: Payer: Self-pay | Admitting: Family Medicine

## 2014-06-28 ENCOUNTER — Telehealth: Payer: Self-pay

## 2014-06-28 DIAGNOSIS — R5383 Other fatigue: Secondary | ICD-10-CM

## 2014-06-28 DIAGNOSIS — Z139 Encounter for screening, unspecified: Secondary | ICD-10-CM

## 2014-06-28 DIAGNOSIS — R5381 Other malaise: Secondary | ICD-10-CM

## 2014-06-28 DIAGNOSIS — Z13220 Encounter for screening for lipoid disorders: Secondary | ICD-10-CM

## 2014-06-28 DIAGNOSIS — I10 Essential (primary) hypertension: Secondary | ICD-10-CM

## 2014-06-28 NOTE — Telephone Encounter (Signed)
Left hand swelling and aching x 1 month. Thought it was arthritis pain but it has gotten worse the past few days. She is having a hard time tying shoes and buttoning her shirt. Sometimes a sharp pain shoots through it also. Aches like a toothache other times. No trama or injury to area. Please advise. Xray?

## 2014-06-28 NOTE — Telephone Encounter (Addendum)
I recommend urgent care evaluation and any imaging ordered from that point, also remind her that she has had no labs since 2012, she has not been in since 09/2013, needs labs fasting lipid, chem 7, hBA1C, CBC and tSH and needs to sched and keep appt

## 2014-06-29 NOTE — Addendum Note (Signed)
Addended by: Eual Fines on: 06/29/2014 10:17 AM   Modules accepted: Orders

## 2014-06-29 NOTE — Telephone Encounter (Signed)
Called and left message as requested and mailed lab order

## 2014-07-20 ENCOUNTER — Telehealth: Payer: Self-pay | Admitting: *Deleted

## 2014-07-20 NOTE — Telephone Encounter (Signed)
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 Please advise

## 2014-07-20 NOTE — Telephone Encounter (Signed)
Called patient no answer.

## 2014-07-20 NOTE — Telephone Encounter (Signed)
Give appt in am pls, needs to come fasting needs labs drawn

## 2014-07-20 NOTE — Telephone Encounter (Signed)
Has not been here since dec 2014. Has been requesting refills that have been denied also. Want to work in tomorrow or have pt go to urgent care for asthma flare Tried to call her back but didn't get her

## 2014-07-20 NOTE — Telephone Encounter (Signed)
Pt called and LMOM that she has a little cold and her asthma is acting up, pt would like to come in for a breathing treatment today or have something called in, pt is requesting a nurse call her back. [phone]

## 2014-07-21 ENCOUNTER — Telehealth: Payer: Self-pay | Admitting: Family Medicine

## 2014-07-21 NOTE — Telephone Encounter (Signed)
Patient aware that she should come in on Monday.  She will have labs drawn on Saturday.

## 2014-07-21 NOTE — Telephone Encounter (Signed)
Patient coming Monday and doing labs Saturday

## 2014-07-24 ENCOUNTER — Ambulatory Visit (INDEPENDENT_AMBULATORY_CARE_PROVIDER_SITE_OTHER): Payer: Managed Care, Other (non HMO) | Admitting: Family Medicine

## 2014-07-24 ENCOUNTER — Encounter (INDEPENDENT_AMBULATORY_CARE_PROVIDER_SITE_OTHER): Payer: Self-pay

## 2014-07-24 ENCOUNTER — Encounter: Payer: Self-pay | Admitting: Family Medicine

## 2014-07-24 VITALS — BP 124/90 | HR 94 | Resp 16 | Ht 65.0 in | Wt 196.0 lb

## 2014-07-24 DIAGNOSIS — L309 Dermatitis, unspecified: Secondary | ICD-10-CM

## 2014-07-24 DIAGNOSIS — E669 Obesity, unspecified: Secondary | ICD-10-CM

## 2014-07-24 DIAGNOSIS — J45901 Unspecified asthma with (acute) exacerbation: Secondary | ICD-10-CM

## 2014-07-24 DIAGNOSIS — Z23 Encounter for immunization: Secondary | ICD-10-CM

## 2014-07-24 DIAGNOSIS — Z1231 Encounter for screening mammogram for malignant neoplasm of breast: Secondary | ICD-10-CM

## 2014-07-24 DIAGNOSIS — M79642 Pain in left hand: Secondary | ICD-10-CM | POA: Insufficient documentation

## 2014-07-24 DIAGNOSIS — G5602 Carpal tunnel syndrome, left upper limb: Secondary | ICD-10-CM

## 2014-07-24 DIAGNOSIS — I10 Essential (primary) hypertension: Secondary | ICD-10-CM

## 2014-07-24 DIAGNOSIS — J45909 Unspecified asthma, uncomplicated: Secondary | ICD-10-CM

## 2014-07-24 MED ORDER — PREDNISONE (PAK) 5 MG PO TABS: 5.0000 mg | ORAL_TABLET | ORAL | Status: DC

## 2014-07-24 MED ORDER — METHYLPREDNISOLONE ACETATE 80 MG/ML IJ SUSP
80.0000 mg | Freq: Once | INTRAMUSCULAR | Status: AC
  Administered 2014-07-24: 80 mg via INTRAMUSCULAR

## 2014-07-24 MED ORDER — AMLODIPINE BESYLATE 10 MG PO TABS: 10.0000 mg | ORAL_TABLET | Freq: Every day | ORAL | Status: DC

## 2014-07-24 MED ORDER — MONTELUKAST SODIUM 10 MG PO TABS: 10.0000 mg | ORAL_TABLET | Freq: Every day | ORAL | Status: DC

## 2014-07-24 MED ORDER — ALBUTEROL SULFATE HFA 108 (90 BASE) MCG/ACT IN AERS: 2.0000 | INHALATION_SPRAY | Freq: Four times a day (QID) | RESPIRATORY_TRACT | Status: DC | PRN

## 2014-07-24 MED ORDER — BUDESONIDE-FORMOTEROL FUMARATE 160-4.5 MCG/ACT IN AERO: 2.0000 | INHALATION_SPRAY | Freq: Two times a day (BID) | RESPIRATORY_TRACT | Status: DC

## 2014-07-24 NOTE — Patient Instructions (Addendum)
F/u in 3.5 month, call if you need me before  You will be referred for mammogram will try to get this on Friday or next Tuesday or Wednesday, will let you know on your cell #  Depo medrol in office and short course of prednisone form asthma Pls cut back on salt and canned food , blood pressure not adequately controlled, also reduce quantity of food eaten so that you lose weight to help blood pressure  You will again get print out of labs, PLS get done fasting , this week or next week, 3 years overdue  Flu vaccine today  You are referred to orthopedics re left hand

## 2014-07-24 NOTE — Assessment & Plan Note (Signed)
Uncontrolled No med change DASH diet and commitment to daily physical activity for a minimum of 30 minutes discussed and encouraged, as a part of hypertension management. The importance of attaining a healthy weight is also discussed.

## 2014-07-24 NOTE — Assessment & Plan Note (Signed)
Vaccine administered at visit.  

## 2014-07-24 NOTE — Assessment & Plan Note (Signed)
Improved , pt encouraged to continue daily skin care

## 2014-07-24 NOTE — Assessment & Plan Note (Addendum)
Depo medrol 80 mg IM in office followed by dose pack Continue singulair daily

## 2014-07-24 NOTE — Assessment & Plan Note (Signed)
3 month h/o disabling left hand pain with weakness , awakens pt

## 2014-07-24 NOTE — Progress Notes (Signed)
   Subjective:    Patient ID: [name], female    DOB: [birth year], 48 y.o.   MRN: [id number]  HPI  2 week h/o excessive dry cough asthma out of control, no fever or chills , no sputum or nasal drainage. Left hand weakness and numbness awakens pt worse in past 3 months,notes weakness in hand, no pain from neck or upper arm Labs and mammogram past due, has not been in for over 1 year Weight gain with less commitment to exercise and diet Has new dermatologist in [place], notes improvement in skin   Review of Systems See HPI Denies recent fever or chills. Denies sinus pressure, nasal congestion, ear pain or sore throat.  Denies chest pains, palpitations and leg swelling Denies abdominal pain, nausea, vomiting,diarrhea or constipation.   Denies dysuria, frequency, hesitancy or incontinence. Denies joint pain, swelling and limitation in mobility. Denies headaches, seizures, numbness, or tingling. Denies depression, anxiety or insomnia.       Objective:   Physical Exam BP 124/90  Pulse 94  Resp 16  Ht 5\' 5"  (1.651 m)  Wt 196 lb (88.905 kg)  BMI 32.62 kg/m2  SpO2 95% Patient alert and oriented and in no cardiopulmonary distress.  HEENT: No facial asymmetry, EOMI,   oropharynx pink and moist.  Neck supple no JVD, no mass.  Chest: decreased air entry ,no wheeze or crackles  CVS: S1, S2 no murmurs, no S3.Regular rate.  ABD: Soft non tender.   Ext: No edema  MS: Adequate ROM spine, shoulders, hips and knees.Positive tinel's sign in left hand with thenar wasting  Skin: Intact, hyperpigmented and erythematous macular lesions noted Psych: Good eye contact, normal affect. Memory intact not anxious or depressed appearing.  CNS: CN 2-12 intact, reduced power in left hand, grips is grade 3       Assessment & Plan:  Left hand pain 3 month h/o disabling left hand pain with weakness , awakens pt  Asthma flare Depo medrol 80 mg IM in office followed by dose  pack Continue singulair daily   Need for prophylactic vaccination and inoculation against influenza Vaccine administered at visit.   Essential hypertension Uncontrolled No med change DASH diet and commitment to daily physical activity for a minimum of 30 minutes discussed and encouraged, as a part of hypertension management. The importance of attaining a healthy weight is also discussed.   Obesity (BMI 30.0-34.9) Deteriorated. Patient re-educated about  the importance of commitment to a  minimum of 150 minutes of exercise per week. The importance of healthy food choices with portion control discussed. Encouraged to start a food diary, count calories and to consider  joining a support group. Sample diet sheets offered. Goals set by the patient for the next several months.     Eczema Improved , pt encouraged to continue daily skin care

## 2014-07-24 NOTE — Assessment & Plan Note (Signed)
Deteriorated. Patient re-educated about  the importance of commitment to a  minimum of 150 minutes of exercise per week. The importance of healthy food choices with portion control discussed. Encouraged to start a food diary, count calories and to consider  joining a support group. Sample diet sheets offered. Goals set by the patient for the next several months.    

## 2014-08-06 ENCOUNTER — Other Ambulatory Visit: Payer: Self-pay | Admitting: Family Medicine

## 2014-08-07 ENCOUNTER — Other Ambulatory Visit: Payer: Self-pay | Admitting: Family Medicine

## 2014-08-08 ENCOUNTER — Encounter (HOSPITAL_COMMUNITY): Payer: Managed Care, Other (non HMO)

## 2014-08-15 ENCOUNTER — Encounter (HOSPITAL_COMMUNITY): Payer: Managed Care, Other (non HMO)

## 2014-09-15 ENCOUNTER — Other Ambulatory Visit: Payer: Self-pay

## 2014-09-15 MED ORDER — PROAIR HFA 108 (90 BASE) MCG/ACT IN AERS: 2.0000 | INHALATION_SPRAY | Freq: Four times a day (QID) | RESPIRATORY_TRACT | Status: DC | PRN

## 2014-09-29 ENCOUNTER — Telehealth: Payer: Self-pay

## 2014-10-01 MED ORDER — PREDNISONE (PAK) 5 MG PO TABS: 5.0000 mg | ORAL_TABLET | ORAL | Status: DC

## 2014-10-01 NOTE — Telephone Encounter (Signed)
Dose pack has been sent in pls let her know

## 2014-10-03 ENCOUNTER — Other Ambulatory Visit: Payer: Self-pay | Admitting: Family Medicine

## 2014-10-03 NOTE — Telephone Encounter (Signed)
Called and left message for patient notifying of rx.

## 2014-10-04 ENCOUNTER — Encounter (HOSPITAL_COMMUNITY): Payer: Self-pay | Admitting: Emergency Medicine

## 2014-10-04 ENCOUNTER — Emergency Department (HOSPITAL_COMMUNITY)
Admission: EM | Admit: 2014-10-04 | Discharge: 2014-10-04 | Discharge status: Against Medical Advice | Payer: Managed Care, Other (non HMO) | Attending: Emergency Medicine | Admitting: Emergency Medicine

## 2014-10-04 ENCOUNTER — Emergency Department (HOSPITAL_COMMUNITY): Payer: Managed Care, Other (non HMO)

## 2014-10-04 DIAGNOSIS — E669 Obesity, unspecified: Secondary | ICD-10-CM | POA: Diagnosis not present

## 2014-10-04 DIAGNOSIS — I10 Essential (primary) hypertension: Secondary | ICD-10-CM | POA: Insufficient documentation

## 2014-10-04 DIAGNOSIS — Z791 Long term (current) use of non-steroidal anti-inflammatories (NSAID): Secondary | ICD-10-CM | POA: Insufficient documentation

## 2014-10-04 DIAGNOSIS — Z7951 Long term (current) use of inhaled steroids: Secondary | ICD-10-CM | POA: Insufficient documentation

## 2014-10-04 DIAGNOSIS — R0602 Shortness of breath: Secondary | ICD-10-CM

## 2014-10-04 DIAGNOSIS — Z7952 Long term (current) use of systemic steroids: Secondary | ICD-10-CM | POA: Diagnosis not present

## 2014-10-04 DIAGNOSIS — Z880 Allergy status to penicillin: Secondary | ICD-10-CM | POA: Diagnosis not present

## 2014-10-04 DIAGNOSIS — Z872 Personal history of diseases of the skin and subcutaneous tissue: Secondary | ICD-10-CM | POA: Insufficient documentation

## 2014-10-04 DIAGNOSIS — J45901 Unspecified asthma with (acute) exacerbation: Secondary | ICD-10-CM | POA: Diagnosis not present

## 2014-10-04 LAB — CBC WITH DIFFERENTIAL/PLATELET
BASOS PCT: 0 % (ref 0–1)
Basophils Absolute: 0 10*3/uL (ref 0.0–0.1)
EOS ABS: 0.6 10*3/uL (ref 0.0–0.7)
EOS PCT: 6 % — AB (ref 0–5)
HCT: 40.9 % (ref 36.0–46.0)
Hemoglobin: 13.4 g/dL (ref 12.0–15.0)
Lymphocytes Relative: 10 % — ABNORMAL LOW (ref 12–46)
Lymphs Abs: 1.1 10*3/uL (ref 0.7–4.0)
MCH: 28.7 pg (ref 26.0–34.0)
MCHC: 32.8 g/dL (ref 30.0–36.0)
MCV: 87.6 fL (ref 78.0–100.0)
MONOS PCT: 7 % (ref 3–12)
Monocytes Absolute: 0.7 10*3/uL (ref 0.1–1.0)
Neutro Abs: 8 10*3/uL — ABNORMAL HIGH (ref 1.7–7.7)
Neutrophils Relative %: 77 % (ref 43–77)
Platelets: 342 10*3/uL (ref 150–400)
RBC: 4.67 MIL/uL (ref 3.87–5.11)
RDW: 14 % (ref 11.5–15.5)
WBC: 10.4 10*3/uL (ref 4.0–10.5)

## 2014-10-04 LAB — BASIC METABOLIC PANEL
Anion gap: 6 (ref 5–15)
BUN: 13 mg/dL (ref 6–23)
CALCIUM: 9.2 mg/dL (ref 8.4–10.5)
CO2: 28 mmol/L (ref 19–32)
CREATININE: 0.78 mg/dL (ref 0.50–1.10)
Chloride: 102 mEq/L (ref 96–112)
GFR calc Af Amer: >90 mL/min (ref >90)
GLUCOSE: 112 mg/dL — AB (ref 70–99)
Potassium: 3.9 mmol/L (ref 3.5–5.1)
Sodium: 136 mmol/L (ref 135–145)

## 2014-10-04 LAB — D-DIMER, QUANTITATIVE: D-Dimer, Quant: 0.36 ug/mL-FEU (ref 0.00–0.48)

## 2014-10-04 MED ORDER — ALBUTEROL SULFATE (2.5 MG/3ML) 0.083% IN NEBU
5.0000 mg | INHALATION_SOLUTION | Freq: Once | RESPIRATORY_TRACT | Status: AC
  Administered 2014-10-04: 5 mg via RESPIRATORY_TRACT
  Filled 2014-10-04: qty 6

## 2014-10-04 MED ORDER — ALBUTEROL SULFATE HFA 108 (90 BASE) MCG/ACT IN AERS: 2.0000 | INHALATION_SPRAY | Freq: Four times a day (QID) | RESPIRATORY_TRACT | Status: DC | PRN

## 2014-10-04 MED ORDER — ALBUTEROL SULFATE HFA 108 (90 BASE) MCG/ACT IN AERS: 2.0000 | INHALATION_SPRAY | RESPIRATORY_TRACT | Status: DC | PRN

## 2014-10-04 MED ORDER — IPRATROPIUM-ALBUTEROL 0.5-2.5 (3) MG/3ML IN SOLN
3.0000 mL | Freq: Once | RESPIRATORY_TRACT | Status: AC
  Administered 2014-10-04: 3 mL via RESPIRATORY_TRACT
  Filled 2014-10-04: qty 3

## 2014-10-04 MED ORDER — ALBUTEROL SULFATE HFA 108 (90 BASE) MCG/ACT IN AERS
2.0000 | INHALATION_SPRAY | RESPIRATORY_TRACT | Status: DC | PRN
  Administered 2014-10-04: 2 via RESPIRATORY_TRACT
  Filled 2014-10-04: qty 6.7

## 2014-10-04 MED ORDER — ALBUTEROL (5 MG/ML) CONTINUOUS INHALATION SOLN
10.0000 mg/h | INHALATION_SOLUTION | Freq: Once | RESPIRATORY_TRACT | Status: AC
  Administered 2014-10-04: 10 mg/h via RESPIRATORY_TRACT
  Filled 2014-10-04: qty 20

## 2014-10-04 MED ORDER — PREDNISONE 50 MG PO TABS
60.0000 mg | ORAL_TABLET | Freq: Once | ORAL | Status: AC
  Administered 2014-10-04: 60 mg via ORAL
  Filled 2014-10-04 (×2): qty 1

## 2014-10-04 NOTE — ED Notes (Addendum)
Patient ambulated around nurses station back to room O2 level 84% while ambulating. MD made aware.

## 2014-10-04 NOTE — ED Provider Notes (Signed)
Assumed care from Dr. Lenna Sciara.  patient was shortness of breath with asthma for the past 3 weeks with cough. Patient continues to wheeze and has hypoxia with ambulation. Continuous nebulizer in progress.  After continuous nebulizer, patient is speaking in full sentences. She still has prolonged expiration phase with wheezes. Desaturates to 87% with ambulation. D-dimer negative. Patient remains tachypnea And wheezing with fair air exchange.  Admission recommended for continued treatment. Patient wishes to go home. She understands that she could get worse. She is adamant that she needs to leave. She is not hypoxic on room air. She understands she is leaving [place] and appears to have capacity to make that decision.  BP 125/85 mmHg  Pulse 115  Temp(Src) 98 F (36.7 C) (Oral)  Resp 18  Ht 5\' 3"  (1.6 m)  Wt 185 lb (83.915 kg)  BMI 32.78 kg/m2  SpO2 95%  LMP 09/20/2014   Ezequiel Essex, MD 10/05/14 [phone]

## 2014-10-04 NOTE — ED Notes (Signed)
Pt ambulated and returned to stretcher on RA. O2 saturation 88%-90%, ins/exp wheezing in all lung fields. EDP at bedside. RT paged for breathing tx.

## 2014-10-04 NOTE — ED Notes (Signed)
Ambulated patient around nurse's station. Heart rate was 120. Oxygen stayed around 88 to 92.

## 2014-10-04 NOTE — Discharge Instructions (Signed)
Asthma, Acute Bronchospasm Your leaving AGAINST MEDICAL ADVICE. Take the prednisone Dr. Moshe Cipro prescribed. Return to the ED if you develop new or worsening symptoms. Acute bronchospasm caused by asthma is also referred to as an asthma attack. Bronchospasm means your air passages become narrowed. The narrowing is caused by inflammation and tightening of the muscles in the air tubes (bronchi) in your lungs. This can make it hard to breathe or cause you to wheeze and cough. CAUSES Possible triggers are:  Animal dander from the skin, hair, or feathers of animals.  Dust mites contained in house dust.  Cockroaches.  Pollen from trees or grass.  Mold.  Cigarette or tobacco smoke.  Air pollutants such as dust, household cleaners, hair sprays, aerosol sprays, paint fumes, strong chemicals, or strong odors.  Cold air or weather changes. Cold air may trigger inflammation. Winds increase molds and pollens in the air.  Strong emotions such as crying or laughing hard.  Stress.  Certain medicines such as aspirin or beta-blockers.  Sulfites in foods and drinks, such as dried fruits and wine.  Infections or inflammatory conditions, such as a flu, cold, or inflammation of the nasal membranes (rhinitis).  Gastroesophageal reflux disease (GERD). GERD is a condition where stomach acid backs up into your esophagus.  Exercise or strenuous activity. SIGNS AND SYMPTOMS   Wheezing.  Excessive coughing, particularly at night.  Chest tightness.  Shortness of breath. DIAGNOSIS  Your health care provider will ask you about your medical history and perform a physical exam. A chest X-ray or blood testing may be performed to look for other causes of your symptoms or other conditions that may have triggered your asthma attack. TREATMENT  Treatment is aimed at reducing inflammation and opening up the airways in your lungs. Most asthma attacks are treated with inhaled medicines. These include quick  relief or rescue medicines (such as bronchodilators) and controller medicines (such as inhaled corticosteroids). These medicines are sometimes given through an inhaler or a nebulizer. Systemic steroid medicine taken by mouth or given through an IV tube also can be used to reduce the inflammation when an attack is moderate or severe. Antibiotic medicines are only used if a bacterial infection is present.  HOME CARE INSTRUCTIONS   Rest.  Drink plenty of liquids. This helps the mucus to remain thin and be easily coughed up. Only use caffeine in moderation and do not use alcohol until you have recovered from your illness.  Do not smoke. Avoid being exposed to secondhand smoke.  You play a critical role in keeping yourself in good health. Avoid exposure to things that cause you to wheeze or to have breathing problems.  Keep your medicines up-to-date and available. Carefully follow your health care provider's treatment plan.  Take your medicine exactly as prescribed.  When pollen or pollution is bad, keep windows closed and use an air conditioner or go to places with air conditioning.  Asthma requires careful medical care. See your health care provider for a follow-up as advised. If you are more than [redacted] weeks pregnant and you were prescribed any new medicines, let your obstetrician know about the visit and how you are doing. Follow up with your health care provider as directed.  After you have recovered from your asthma attack, make an appointment with your outpatient doctor to talk about ways to reduce the likelihood of future attacks. If you do not have a doctor who manages your asthma, make an appointment with a primary care doctor to discuss your  asthma. SEEK IMMEDIATE MEDICAL CARE IF:   You are getting worse.  You have trouble breathing. If severe, call your local emergency services (911 in the [place]  You develop chest pain or discomfort.  You are vomiting.  You are not able to keep  fluids down.  You are coughing up yellow, green, brown, or bloody sputum.  You have a fever and your symptoms suddenly get worse.  You have trouble swallowing. MAKE SURE YOU:   Understand these instructions.  Will watch your condition.  Will get help right away if you are not doing well or get worse. Document Released: 01/14/2007 Document Revised: 10/04/2013 Document Reviewed: 04/06/2013 [facility] Patient Information 2015 [place], [place]. This information is not intended to replace advice given to you by your health care provider. Make sure you discuss any questions you have with your health care provider.

## 2014-10-04 NOTE — ED Notes (Signed)
Pt reports cough x2 weeks. Pt reports increased in chest tightness x3 days. Pt reports using inhaler with no relief. Purse lip breathing noted in triage. Wheezing auscultated in all lung fields. RT paged.

## 2014-10-04 NOTE — ED Notes (Signed)
Advised pt. Of risks of leaving AMA. Pt. Verbalized understanding. Advised pt. That if she changed her mind to return to ED for further evaluation.

## 2014-10-04 NOTE — ED Provider Notes (Signed)
CSN: [id number]     Arrival date & time 10/04/14  1320 History   First MD Initiated Contact with Patient 10/04/14 1343     Chief Complaint  Patient presents with  . Shortness of Breath     (Consider location/radiation/quality/duration/timing/severity/associated sxs/prior Treatment) HPI Complains of shortness of breath typical of asthma onset 3 weeks ago, progressively worsening symptoms accompanied by dry cough. No fever. No other associated symptoms. She had been using her albuterol inhaler but has run out. She reports that Dr.Simpson phoned in a prescription for prednisone today however patient was unable to get it filled because she was too dyspneic. Dyspnea is worse with exertion not improved by anything. Past Medical History  Diagnosis Date  . Hypertension   . Asthma 2010  . Eczema since childhoosd    flare since 2011   Past Surgical History  Procedure Laterality Date  . Knee surgery    . Back surgery     History reviewed. No pertinent family history. History  Substance Use Topics  . Smoking status: Never Smoker   . Smokeless tobacco: Not on file  . Alcohol Use: No   OB History    No data available     Review of Systems  Constitutional: Negative.   HENT: Negative.   Respiratory: Positive for cough, chest tightness, shortness of breath and wheezing.   Cardiovascular: Negative.   Gastrointestinal: Negative.   Musculoskeletal: Negative.   Skin: Negative.   Neurological: Negative.   Psychiatric/Behavioral: Negative.   All other systems reviewed and are negative.     Allergies  Penicillins  Home Medications   Prior to Admission medications   Medication Sig Start Date End Date Taking? Authorizing Provider  amLODipine (NORVASC) 10 MG tablet Take 1 tablet (10 mg total) by mouth daily. 07/24/14  Yes Fayrene Helper, MD  budesonide-formoterol [facility]) 160-4.5 MCG/ACT inhaler Inhale 2 puffs into the lungs 2 (two) times daily. 07/24/14  Yes Fayrene Helper, MD  diclofenac (VOLTAREN) 75 MG EC tablet Take 1 tablet by mouth 2 (two) times daily. 09/30/14  Yes Historical Provider, MD  hydrOXYzine (ATARAX/VISTARIL) 10 MG tablet TAKE 1 TO 2 TABLETS BY MOUTH AT BEDTIME AS NEEDED FOR ITCHING 08/07/14  Yes Fayrene Helper, MD  montelukast (SINGULAIR) 10 MG tablet Take 1 tablet (10 mg total) by mouth daily. 07/24/14  Yes Fayrene Helper, MD  PROAIR HFA 108 [phone] BASE) MCG/ACT inhaler Inhale 2 puffs into the lungs every 6 (six) hours as needed for wheezing or shortness of breath. 09/15/14  Yes Fayrene Helper, MD  triamcinolone ointment (KENALOG) 0.1 % Apply 1 application topically 2 (two) times daily.  03/10/14  Yes Historical Provider, MD  albuterol (PROVENTIL HFA;VENTOLIN HFA) 108 (90 BASE) MCG/ACT inhaler Inhale 2 puffs into the lungs every 6 (six) hours as needed for wheezing or shortness of breath. Patient not taking: Reported on 10/04/2014 07/24/14   Fayrene Helper, MD  ipratropium-albuterol (DUONEB) 0.5-2.5 (3) MG/3ML SOLN One inhalation every 8 hours as needed, for severe wheezing Patient not taking: Reported on 10/04/2014 01/19/13 01/19/14  Fayrene Helper, MD  predniSONE (STERAPRED UNI-PAK) 5 MG TABS tablet Take 1 tablet (5 mg total) by mouth as directed. Use as directed Patient not taking: Reported on 10/04/2014 10/01/14   Fayrene Helper, MD   BP 136/91 mmHg  Pulse 103  Temp(Src) 98 F (36.7 C) (Oral)  Resp 20  Ht 5\' 3"  (1.6 m)  Wt 185 lb (83.915 kg)  BMI 32.78  kg/m2  SpO2 92%  LMP 09/20/2014 Physical Exam  Constitutional: She appears well-developed and well-nourished. She appears distressed.  Respiratory distress speaks in sentences  HENT:  Head: Normocephalic and atraumatic.  Eyes: Conjunctivae are normal. Pupils are equal, round, and reactive to light.  Neck: Neck supple. No tracheal deviation present. No thyromegaly present.  Cardiovascular: Normal rate and regular rhythm.   No murmur heard. Pulmonary/Chest: She  is in respiratory distress.  Mild respiratory distress Prolonged expert phase with extra wheezes  Abdominal: Soft. Bowel sounds are normal. She exhibits no distension. There is no tenderness.  Obese  Musculoskeletal: Normal range of motion. She exhibits no edema or tenderness.  Neurological: She is alert. Coordination normal.  Skin: Skin is warm and dry. No rash noted.  Psychiatric: She has a normal mood and affect.  Nursing note and vitals reviewed.   ED Course  Procedures (including critical care time) Labs Review Labs Reviewed - No data to display  Imaging Review No results found.   EKG Interpretation   Date/Time:  Wednesday October 04 2014 13:25:40 EST Ventricular Rate:  106 PR Interval:  155 QRS Duration: 80 QT Interval:  319 QTC Calculation: 424 R Axis:   72 Text Interpretation:  Sinus tachycardia Biatrial enlargement SINCE LAST  TRACING HEART RATE HAS INCREASED Confirmed by Winfred Leeds  MD, Friedrich Harriott [phone])  on 10/04/2014 2:44:13 PM     3:30 PM after 2 nebulized treatments and prednisone patient states her breathing is back to normal however when ambulating pulse ox decreased to 88%. On reexamination she speaks in paragraphs no respiratory distress lungs with expiratory wheezes. Another nebulizer treatment has been ordered Chest x-ray viewed by me  4:10 PM after 3 nebulized treatments patient's respiratory rate increased to 46 and pulse oximetry dropped to 84% on room air while ambulating. Continuous nebulization ordered Patient signed out to Dr.Rancour at 4:15 PM Chest x-ray viewed by me Results for orders placed or performed during the hospital encounter of 10/04/14  CBC with Differential  Result Value Ref Range   WBC 10.4 4.0 - 10.5 K/uL   RBC 4.67 3.87 - 5.11 MIL/uL   Hemoglobin 13.4 12.0 - 15.0 g/dL   HCT 40.9 36.0 - 46.0 %   MCV 87.6 78.0 - 100.0 fL   MCH 28.7 26.0 - 34.0 pg   MCHC 32.8 30.0 - 36.0 g/dL   RDW 14.0 11.5 - 15.5 %   Platelets 342 150 - 400 K/uL    Neutrophils Relative % 77 43 - 77 %   Neutro Abs 8.0 (H) 1.7 - 7.7 K/uL   Lymphocytes Relative 10 (L) 12 - 46 %   Lymphs Abs 1.1 0.7 - 4.0 K/uL   Monocytes Relative 7 3 - 12 %   Monocytes Absolute 0.7 0.1 - 1.0 K/uL   Eosinophils Relative 6 (H) 0 - 5 %   Eosinophils Absolute 0.6 0.0 - 0.7 K/uL   Basophils Relative 0 0 - 1 %   Basophils Absolute 0.0 0.0 - 0.1 K/uL  Basic metabolic panel  Result Value Ref Range   Sodium 136 135 - 145 mmol/L   Potassium 3.9 3.5 - 5.1 mmol/L   Chloride 102 96 - 112 mEq/L   CO2 28 19 - 32 mmol/L   Glucose, Bld 112 (H) 70 - 99 mg/dL   BUN 13 6 - 23 mg/dL   Creatinine, Ser 0.78 0.50 - 1.10 mg/dL   Calcium 9.2 8.4 - 10.5 mg/dL   GFR calc non Af Amer >90 >90  mL/min   GFR calc Af Amer >90 >90 mL/min   Anion gap 6 5 - 15   Dg Chest Portable 1 View  10/04/2014   CLINICAL DATA:  Cough for 2 weeks, shortness of breath  EXAM: PORTABLE CHEST - 1 VIEW  COMPARISON:  11/11/2010  FINDINGS: The heart size and mediastinal contours are within normal limits. Both lungs are clear. The visualized skeletal structures are unremarkable.  IMPRESSION: No active disease.   Electronically Signed   By: Inez Catalina M.D.   On: 10/04/2014 13:46    MDM  Diagnosis exacerbation of asthma Final diagnoses:  SOB (shortness of breath)        Orlie Dakin, MD 10/04/14 [phone]

## 2014-10-04 NOTE — ED Notes (Signed)
Ambulated pt around the nurses station; O2 sat.rate went down from 91% to 86%. Resp rate of 20. Pt states she feels much better than when she came in.

## 2014-10-31 ENCOUNTER — Other Ambulatory Visit: Payer: Self-pay | Admitting: Family Medicine

## 2014-11-30 ENCOUNTER — Telehealth: Payer: Self-pay | Admitting: *Deleted

## 2014-11-30 NOTE — Telephone Encounter (Signed)
Pt called requesting to speak with Brandi. Please advise [phone]

## 2014-11-30 NOTE — Telephone Encounter (Signed)
Coming tomorrow for eval of her eczema. Her face is broken out and burning from a rash and she states she needs prednisone and she hasn't been here in 4 months

## 2014-11-30 NOTE — Telephone Encounter (Signed)
Called patient and left message for them to return call at the office   

## 2014-12-01 ENCOUNTER — Ambulatory Visit (INDEPENDENT_AMBULATORY_CARE_PROVIDER_SITE_OTHER): Payer: Managed Care, Other (non HMO) | Admitting: Family Medicine

## 2014-12-01 ENCOUNTER — Encounter: Payer: Self-pay | Admitting: Family Medicine

## 2014-12-01 ENCOUNTER — Other Ambulatory Visit: Payer: Self-pay | Admitting: Family Medicine

## 2014-12-01 ENCOUNTER — Encounter (INDEPENDENT_AMBULATORY_CARE_PROVIDER_SITE_OTHER): Payer: Managed Care, Other (non HMO)

## 2014-12-01 ENCOUNTER — Other Ambulatory Visit: Payer: Self-pay

## 2014-12-01 VITALS — BP 124/90 | HR 92 | Resp 16 | Ht 65.0 in | Wt 192.0 lb

## 2014-12-01 DIAGNOSIS — R7303 Prediabetes: Secondary | ICD-10-CM

## 2014-12-01 DIAGNOSIS — E559 Vitamin D deficiency, unspecified: Secondary | ICD-10-CM

## 2014-12-01 DIAGNOSIS — E049 Nontoxic goiter, unspecified: Secondary | ICD-10-CM

## 2014-12-01 DIAGNOSIS — J45901 Unspecified asthma with (acute) exacerbation: Secondary | ICD-10-CM

## 2014-12-01 DIAGNOSIS — Z09 Encounter for follow-up examination after completed treatment for conditions other than malignant neoplasm: Secondary | ICD-10-CM

## 2014-12-01 DIAGNOSIS — E669 Obesity, unspecified: Secondary | ICD-10-CM

## 2014-12-01 DIAGNOSIS — N63 Unspecified lump in unspecified breast: Secondary | ICD-10-CM

## 2014-12-01 DIAGNOSIS — R7301 Impaired fasting glucose: Secondary | ICD-10-CM

## 2014-12-01 DIAGNOSIS — E785 Hyperlipidemia, unspecified: Secondary | ICD-10-CM

## 2014-12-01 DIAGNOSIS — R946 Abnormal results of thyroid function studies: Secondary | ICD-10-CM

## 2014-12-01 DIAGNOSIS — L309 Dermatitis, unspecified: Secondary | ICD-10-CM

## 2014-12-01 DIAGNOSIS — N644 Mastodynia: Secondary | ICD-10-CM

## 2014-12-01 DIAGNOSIS — I10 Essential (primary) hypertension: Secondary | ICD-10-CM

## 2014-12-01 DIAGNOSIS — Z13220 Encounter for screening for lipoid disorders: Secondary | ICD-10-CM

## 2014-12-01 DIAGNOSIS — R7309 Other abnormal glucose: Secondary | ICD-10-CM

## 2014-12-01 DIAGNOSIS — E66811 Obesity, class 1: Secondary | ICD-10-CM

## 2014-12-01 MED ORDER — ALBUTEROL SULFATE (2.5 MG/3ML) 0.083% IN NEBU
2.5000 mg | INHALATION_SOLUTION | Freq: Once | RESPIRATORY_TRACT | Status: AC
Start: 2014-12-01 — End: 2014-12-01
  Administered 2014-12-01: 2.5 mg via RESPIRATORY_TRACT

## 2014-12-01 MED ORDER — METHYLPREDNISOLONE ACETATE 80 MG/ML IJ SUSP
80.0000 mg | Freq: Once | INTRAMUSCULAR | Status: AC
  Administered 2014-12-01: 80 mg via INTRAMUSCULAR

## 2014-12-01 MED ORDER — FLUTICASONE PROPIONATE 50 MCG/ACT NA SUSP: 2.0000 | Freq: Every day | NASAL | Status: DC

## 2014-12-01 MED ORDER — PREDNISONE (PAK) 5 MG PO TABS: ORAL_TABLET | ORAL | Status: DC

## 2014-12-01 MED ORDER — IPRATROPIUM BROMIDE 0.02 % IN SOLN
0.5000 mg | Freq: Once | RESPIRATORY_TRACT | Status: AC
  Administered 2014-12-01: 0.5 mg via RESPIRATORY_TRACT

## 2014-12-01 MED ORDER — MOMETASONE FUROATE 50 MCG/ACT NA SUSP: 2.0000 | Freq: Every day | NASAL | Status: DC

## 2014-12-01 NOTE — Patient Instructions (Addendum)
Annual physical exam in 3.5 month, call if you need me before  You are treated for uncontrolled allergies andd asthma  Work excuse from 02/17 to return 12/06/2014  Neb treatment and depo medrol in office today  Prednisone prescribed for 6 days   You need to use DAILY symbicort or advair , whichever is covered to help your breathing  For nasal congestion/ allergies you need DAILY nasonex , and flush nostrils twice daily with saline  You NEED  Your mammogram , we are arranging this  Lipid, HBA1C, TSH and vit D today and hepatic panel

## 2014-12-01 NOTE — Progress Notes (Signed)
Subjective:    Patient ID: [name], female    DOB: 1966/08/18, 49 y.o.   MRN: [id number]  HPI The PT is here for follow up and re-evaluation of chronic medical conditions, medication management and review of any available recent lab and radiology data.  Preventive health is updated, specifically  Cancer screening and Immunization.   Questions or concerns regarding consultations or procedures which the PT has had in the interim are  addressed. Has been having flare of asthma since 2 days ago, no significant improvement , still a lot of wheezing and shortness of breath with limited activity. C/o rash on face as a flare up os her exema, she does have a potent steroid for use on trunk and extremities, this started 2 days ago also      Review of Systems See HPI Denies recent fever or chills. Denies sinus pressure, increased  nasal congestion, denies  ear pain or sore throat. . Denies chest pains, palpitations and leg swelling Denies abdominal pain, nausea, vomiting,diarrhea or constipation.   Denies dysuria, frequency, hesitancy or incontinence. Denies joint pain, swelling and limitation in mobility. Denies headaches, seizures, numbness, or tingling. Denies depression, anxiety or insomnia.         Objective:   Physical Exam BP 124/90 mmHg  Pulse 92  Resp 16  Ht 5\' 5"  (1.651 m)  Wt 192 lb (87.091 kg)  BMI 31.95 kg/m2  SpO2 96% Patient alert and oriented and in mild cardiopulmonary distress.  HEENT: No facial asymmetry, EOMI,   oropharynx pink and moist.  Neck supple no JVD, thyromegaly  Chest: decreased air entry bilaterally, wheezes, no crackles  CVS: S1, S2 no murmurs, no S3.Regular rate.  ABD: Soft non tender.   Ext: No edema  MS: Adequate ROM spine, shoulders, hips and knees.  Skin: Intact, erythematous macular rash on face  Psych: Good eye contact, normal affect. Memory intact not anxious or depressed appearing.  CNS: CN 2-12 intact, power,  normal  throughout.no focal deficits noted.        Assessment & Plan:  Asthma flare Ucnontrolled, neb treatment and depomedrol, start daily advair and have access to rescue neb treatment at home, pt to start daily spirometry   Eczema acuite flare on face, oral and topical steroids, improved control with topical steroid   Obesity (BMI 30.0-34.9) Deteriorated Patient re-educated about  the importance of commitment to a  minimum of 150 minutes of exercise per week. The importance of healthy food choices with portion control discussed. Encouraged to start a food diary, count calories and to consider  joining a support group. Sample diet sheets offered. Goals set by the patient for the next several months.      THYROID FUNCTION TEST, ABNORMAL Thyroid US to further eval, rechecked lab is abnormal needs re imaging   Essential hypertension UnControlled, diastolic is elevated no change in medication DASH diet and commitment to daily physical activity for a minimum of 30 minutes discussed and encouraged, as a part of hypertension management. The importance of attaining a healthy weight is also discussed.    Hyperlipidemia LDL goal <100 Updated lab shows improvement but pt still needs to get to goal Hyperlipidemia:Low fat diet discussed and encouraged.     Prediabetes Patient educated about the importance of limiting  Carbohydrate intake , the need to commit to daily physical activity for a minimum of 30 minutes , and to commit weight loss. The fact that changes in all these areas will reduce or  eliminate all together the development of diabetes is stressed.

## 2014-12-04 ENCOUNTER — Telehealth: Payer: Self-pay | Admitting: Family Medicine

## 2014-12-04 DIAGNOSIS — J45901 Unspecified asthma with (acute) exacerbation: Secondary | ICD-10-CM

## 2014-12-04 MED ORDER — IPRATROPIUM-ALBUTEROL 0.5-2.5 (3) MG/3ML IN SOLN: RESPIRATORY_TRACT | Status: DC

## 2014-12-04 NOTE — Telephone Encounter (Signed)
Med sent.

## 2014-12-05 ENCOUNTER — Other Ambulatory Visit: Payer: Self-pay | Admitting: Family Medicine

## 2014-12-05 LAB — HEPATIC FUNCTION PANEL
ALBUMIN: 4.1 g/dL (ref 3.5–5.2)
ALT: 17 U/L (ref 0–35)
AST: 15 U/L (ref 0–37)
Alkaline Phosphatase: 82 U/L (ref 39–117)
BILIRUBIN INDIRECT: 0.3 mg/dL (ref 0.2–1.2)
Bilirubin, Direct: 0.1 mg/dL (ref 0.0–0.3)
TOTAL PROTEIN: 7.1 g/dL (ref 6.0–8.3)
Total Bilirubin: 0.4 mg/dL (ref 0.2–1.2)

## 2014-12-05 LAB — LIPID PANEL
CHOLESTEROL: 231 mg/dL — AB (ref 0–200)
HDL: 110 mg/dL (ref >=46)
LDL CALC: 100 mg/dL — AB (ref 0–99)
TRIGLYCERIDES: 107 mg/dL (ref <150)
Total CHOL/HDL Ratio: 2.1 Ratio
VLDL: 21 mg/dL (ref 0–40)

## 2014-12-05 LAB — TSH: TSH: 6.091 u[IU]/mL — AB (ref 0.350–4.500)

## 2014-12-06 ENCOUNTER — Telehealth: Payer: Self-pay

## 2014-12-06 LAB — VITAMIN D 25 HYDROXY (VIT D DEFICIENCY, FRACTURES): Vit D, 25-Hydroxy: 12 ng/mL — ABNORMAL LOW (ref 30–100)

## 2014-12-06 LAB — HEMOGLOBIN A1C
Hgb A1c MFr Bld: 5.9 % — ABNORMAL HIGH (ref <5.7)
Mean Plasma Glucose: 123 mg/dL — ABNORMAL HIGH (ref <117)

## 2014-12-06 NOTE — Telephone Encounter (Signed)
Called HR dept and left message for them to return call.

## 2014-12-07 ENCOUNTER — Encounter (INDEPENDENT_AMBULATORY_CARE_PROVIDER_SITE_OTHER): Payer: Self-pay

## 2014-12-07 ENCOUNTER — Ambulatory Visit
Admission: RE | Admit: 2014-12-07 | Discharge: 2014-12-07 | Disposition: A | Discharge status: Home / Self Care | Payer: Managed Care, Other (non HMO) | Source: Ambulatory Visit | Attending: Family Medicine | Admitting: Family Medicine

## 2014-12-07 DIAGNOSIS — N63 Unspecified lump in unspecified breast: Secondary | ICD-10-CM

## 2014-12-07 DIAGNOSIS — N644 Mastodynia: Secondary | ICD-10-CM

## 2014-12-07 LAB — T4, FREE: FREE T4: 1.03 ng/dL (ref 0.80–1.80)

## 2014-12-07 LAB — T3, FREE: T3, Free: 2.4 pg/mL (ref 2.3–4.2)

## 2014-12-19 ENCOUNTER — Encounter (HOSPITAL_COMMUNITY): Payer: Managed Care, Other (non HMO)

## 2014-12-22 ENCOUNTER — Other Ambulatory Visit: Payer: Self-pay

## 2014-12-22 DIAGNOSIS — R7303 Prediabetes: Secondary | ICD-10-CM | POA: Insufficient documentation

## 2014-12-22 DIAGNOSIS — E785 Hyperlipidemia, unspecified: Secondary | ICD-10-CM | POA: Insufficient documentation

## 2014-12-22 MED ORDER — VITAMIN D (ERGOCALCIFEROL) 1.25 MG (50000 UNIT) PO CAPS: 50000.0000 [IU] | ORAL_CAPSULE | ORAL | Status: DC

## 2014-12-22 NOTE — Assessment & Plan Note (Signed)
Patient educated about the importance of limiting  Carbohydrate intake , the need to commit to daily physical activity for a minimum of 30 minutes , and to commit weight loss. The fact that changes in all these areas will reduce or eliminate all together the development of diabetes is stressed.    

## 2014-12-22 NOTE — Assessment & Plan Note (Signed)
Thyroid US to further eval, rechecked lab is abnormal needs re imaging

## 2014-12-22 NOTE — Assessment & Plan Note (Addendum)
Deteriorated. Patient re-educated about  the importance of commitment to a  minimum of 150 minutes of exercise per week. The importance of healthy food choices with portion control discussed. Encouraged to start a food diary, count calories and to consider  joining a support group. Sample diet sheets offered. Goals set by the patient for the next several months.    

## 2014-12-22 NOTE — Assessment & Plan Note (Signed)
Updated lab shows improvement but pt still needs to get to goal Hyperlipidemia:Low fat diet discussed and encouraged.

## 2014-12-22 NOTE — Assessment & Plan Note (Signed)
acuite flare on face, oral and topical steroids, improved control with topical steroid

## 2014-12-22 NOTE — Assessment & Plan Note (Signed)
Ucnontrolled, neb treatment and depomedrol, start daily advair and have access to rescue neb treatment at home, pt to start daily spirometry

## 2014-12-22 NOTE — Assessment & Plan Note (Addendum)
UnControlled, diastolic is elevated no change in medication DASH diet and commitment to daily physical activity for a minimum of 30 minutes discussed and encouraged, as a part of hypertension management. The importance of attaining a healthy weight is also discussed.

## 2014-12-27 ENCOUNTER — Ambulatory Visit (HOSPITAL_COMMUNITY): Payer: Managed Care, Other (non HMO)

## 2014-12-28 ENCOUNTER — Telehealth: Payer: Self-pay | Admitting: Family Medicine

## 2014-12-28 ENCOUNTER — Other Ambulatory Visit: Payer: Self-pay

## 2014-12-28 MED ORDER — PREDNISONE (PAK) 5 MG PO TABS: ORAL_TABLET | ORAL | Status: DC

## 2014-12-28 NOTE — Telephone Encounter (Signed)
Broken out on her face and neck. States prednisone is the only thing that helps it. Please advise

## 2014-12-28 NOTE — Telephone Encounter (Signed)
Verbal order given by Dr to refill x 1

## 2014-12-29 ENCOUNTER — Ambulatory Visit (HOSPITAL_COMMUNITY)
Admission: RE | Admit: 2014-12-29 | Discharge: 2014-12-29 | Disposition: A | Discharge status: Home / Self Care | Payer: Managed Care, Other (non HMO) | Source: Ambulatory Visit | Attending: Family Medicine | Admitting: Family Medicine

## 2014-12-29 ENCOUNTER — Encounter: Payer: Self-pay | Admitting: Family Medicine

## 2014-12-29 ENCOUNTER — Telehealth: Payer: Self-pay | Admitting: Family Medicine

## 2014-12-29 DIAGNOSIS — E041 Nontoxic single thyroid nodule: Secondary | ICD-10-CM

## 2014-12-29 DIAGNOSIS — R946 Abnormal results of thyroid function studies: Secondary | ICD-10-CM | POA: Diagnosis not present

## 2014-12-29 DIAGNOSIS — E049 Nontoxic goiter, unspecified: Secondary | ICD-10-CM

## 2014-12-29 DIAGNOSIS — R9389 Abnormal findings on diagnostic imaging of other specified body structures: Secondary | ICD-10-CM | POA: Insufficient documentation

## 2014-12-29 DIAGNOSIS — R7989 Other specified abnormal findings of blood chemistry: Secondary | ICD-10-CM

## 2014-12-29 NOTE — Telephone Encounter (Signed)
Pls let pt know that since her thyroid function test as well as her thyroid US are abnormal, I recommend evaluation by an endocrinologist I am entering the referral to Dr Nadara Mustard co ordinate with referral staff

## 2014-12-29 NOTE — Progress Notes (Signed)
pls also let her know that since her thyroid function test is abnormal and she has a possible nodule, I recommend an endocrinologist evaluation, if she agrees pls refer to dr Dorris Fetch, abn thyropid function test and abnormal thyroid US, I will sign

## 2015-01-01 NOTE — Telephone Encounter (Signed)
Called and left message for patient to return call.  

## 2015-01-01 NOTE — Telephone Encounter (Signed)
Patient aware.

## 2015-01-10 ENCOUNTER — Other Ambulatory Visit: Payer: Self-pay | Admitting: Family Medicine

## 2015-01-11 ENCOUNTER — Ambulatory Visit: Payer: Self-pay

## 2015-01-11 ENCOUNTER — Other Ambulatory Visit: Payer: Self-pay | Admitting: Occupational Medicine

## 2015-01-11 DIAGNOSIS — Z0000 Encounter for general adult medical examination without abnormal findings: Secondary | ICD-10-CM

## 2015-01-26 ENCOUNTER — Other Ambulatory Visit: Payer: Self-pay

## 2015-01-26 ENCOUNTER — Other Ambulatory Visit: Payer: Self-pay | Admitting: Family Medicine

## 2015-01-26 ENCOUNTER — Telehealth: Payer: Self-pay

## 2015-01-26 MED ORDER — AZITHROMYCIN 250 MG PO TABS: ORAL_TABLET | ORAL | Status: DC

## 2015-01-26 MED ORDER — PREDNISONE 5 MG (21) PO TBPK: ORAL_TABLET | ORAL | Status: DC

## 2015-01-26 NOTE — Telephone Encounter (Signed)
pls send prednisone 5 mg dose pack #21 only and a z pack x 1 only Advise UC if symptoms worsen ,over the weekend, also let her know will need to be seen on Monday in office if she misses work, and work excuse would be effective from today

## 2015-01-26 NOTE — Telephone Encounter (Signed)
States she's had a cough in her chest x 1 week, started out with chills and low grade temp. No sinus congestion but tightness in her chest and coughing up small amounts of yellow phlegm and its starting to trigger her asthma. [facility] advise [facility] 

## 2015-01-26 NOTE — Telephone Encounter (Signed)
Med sent, pt aware

## 2015-02-07 ENCOUNTER — Other Ambulatory Visit: Payer: Self-pay

## 2015-02-07 ENCOUNTER — Telehealth: Payer: Self-pay | Admitting: Family Medicine

## 2015-02-07 DIAGNOSIS — J45909 Unspecified asthma, uncomplicated: Secondary | ICD-10-CM

## 2015-02-07 MED ORDER — BUDESONIDE-FORMOTEROL FUMARATE 160-4.5 MCG/ACT IN AERO: 2.0000 | INHALATION_SPRAY | Freq: Two times a day (BID) | RESPIRATORY_TRACT | Status: DC

## 2015-02-07 MED ORDER — ALBUTEROL SULFATE HFA 108 (90 BASE) MCG/ACT IN AERS: 2.0000 | INHALATION_SPRAY | Freq: Four times a day (QID) | RESPIRATORY_TRACT | Status: DC | PRN

## 2015-02-07 NOTE — Telephone Encounter (Signed)
meds refilled 

## 2015-02-09 ENCOUNTER — Telehealth: Payer: Self-pay

## 2015-02-09 DIAGNOSIS — J4541 Moderate persistent asthma with (acute) exacerbation: Secondary | ICD-10-CM

## 2015-02-09 MED ORDER — PREDNISONE 5 MG (21) PO TBPK: ORAL_TABLET | ORAL | Status: DC

## 2015-02-09 NOTE — Telephone Encounter (Signed)
Patient aware and referred

## 2015-02-09 NOTE — Addendum Note (Signed)
Addended by: Eual Fines on: 02/09/2015 01:59 PM   Modules accepted: Orders

## 2015-02-09 NOTE — Telephone Encounter (Signed)
pls send in prednisone 5mg  dose pack # 21 no refills. Also I see call for albuterol  MDI refill from 4/27 pls send also if not yet done and let her know  ALSO explain that she is having too many episodes of calling in with incontrolled asthma , which is a serious illness. She NEEDS the benefit of a lung specialist to get her asthma controlled. I recommend [facility], and Pls refer if she agrees dx  uncontrolled asthma is the reason, pls send a message back igf refusing

## 2015-02-09 NOTE — Telephone Encounter (Signed)
States she went to work yesterday and after a few hours her asthma flared badly. Doesn't want to go to the ER and pay the high cost. She has her inhaler but it only relieves it for a short time. Coughing badly but its all clear mucus. Wants some prednisone to go with it. States its the only thing that relieves it. Please advise [phone]

## 2015-02-27 ENCOUNTER — Institutional Professional Consult (permissible substitution): Payer: Self-pay | Admitting: Internal Medicine

## 2015-03-09 ENCOUNTER — Other Ambulatory Visit: Payer: Self-pay | Admitting: Family Medicine

## 2015-03-23 ENCOUNTER — Other Ambulatory Visit: Payer: Self-pay | Admitting: Family Medicine

## 2015-04-02 ENCOUNTER — Ambulatory Visit (INDEPENDENT_AMBULATORY_CARE_PROVIDER_SITE_OTHER): Payer: Managed Care, Other (non HMO) | Admitting: Family Medicine

## 2015-04-02 ENCOUNTER — Encounter: Payer: Self-pay | Admitting: Family Medicine

## 2015-04-02 VITALS — BP 108/64 | HR 98 | Resp 18 | Ht 65.0 in | Wt 197.0 lb

## 2015-04-02 DIAGNOSIS — E785 Hyperlipidemia, unspecified: Secondary | ICD-10-CM

## 2015-04-02 DIAGNOSIS — E669 Obesity, unspecified: Secondary | ICD-10-CM

## 2015-04-02 DIAGNOSIS — H6091 Unspecified otitis externa, right ear: Secondary | ICD-10-CM

## 2015-04-02 DIAGNOSIS — L03116 Cellulitis of left lower limb: Secondary | ICD-10-CM | POA: Diagnosis not present

## 2015-04-02 DIAGNOSIS — H60509 Unspecified acute noninfective otitis externa, unspecified ear: Secondary | ICD-10-CM | POA: Insufficient documentation

## 2015-04-02 DIAGNOSIS — I10 Essential (primary) hypertension: Secondary | ICD-10-CM

## 2015-04-02 MED ORDER — SULFAMETHOXAZOLE-TRIMETHOPRIM 800-160 MG PO TABS: 1.0000 | ORAL_TABLET | Freq: Two times a day (BID) | ORAL | Status: DC

## 2015-04-02 NOTE — Patient Instructions (Addendum)
F/u with pap in 4 to 6 weeks call if you need me before  You are treated for cellulitis left lower extremity and right otitis externa  Work excuse from 6/21 to return 6/28  Please work on good  health habits so that your health will improve. 1. Commitment to daily physical activity for 30 to 60  minutes, if you are able to do this.  2. Commitment to wise food choices. Aim for half of your  food intake to be vegetable and fruit, one quarter starchy foods, and one quarter protein. Try to eat on a regular schedule  3 meals per day, snacking between meals should be limited to vegetables or fruits or small portions of nuts. 64 ounces of water per day is generally recommended, unless you have specific health conditions, like heart failure or kidney failure where you will need to limit fluid intake.  3. Commitment to sufficient and a  good quality of physical and mental rest daily, generally between 6 to 8 hours per day.  WITH PERSISTANCE AND PERSEVERANCE, THE IMPOSSIBLE , BECOMES THE NORM!   Thanks for choosing [facility], we consider it a privelige to serve you.

## 2015-04-02 NOTE — Assessment & Plan Note (Addendum)
4 day history of right ear pain and fullness, no trauma, no drainage , no hearing loss septra x 1 0 days

## 2015-04-04 ENCOUNTER — Encounter: Payer: Managed Care, Other (non HMO) | Admitting: Family Medicine

## 2015-04-19 NOTE — Assessment & Plan Note (Signed)
Left lower extremity warmth and redness, no skin breakdown, acute cellulitis , septra x 10 days work excuse from 6/21 to 04/10/2015

## 2015-04-19 NOTE — Assessment & Plan Note (Signed)
Hyperlipidemia:Low fat diet discussed and encouraged.   Lipid Panel  Lab Results  Component Value Date   CHOL 231* 12/05/2014   HDL 110 12/05/2014   LDLCALC 100* 12/05/2014   TRIG 107 12/05/2014   CHOLHDL 2.1 12/05/2014   Updated lab needed at/ before next visit.

## 2015-04-19 NOTE — Progress Notes (Signed)
Subjective:    Patient ID: [name], female    DOB: 05-30-1966, 49 y.o.   MRN: [id number]  HPI 4   day h/o right ear pain with pressure , denies loss of hearing, trauma or drainage, no recent fever , chills or URI symptoms 3 day h/o pain and redness of left leg, no skin breakdown, states leg is to painful for her to work as she stands on the job Overall eczema is doing well  Review of Systems See HPI Denies recent fever or chills. Denies sinus pressure, nasal congestion, or sore throat. Denies chest congestion, productive cough or wheezing. Denies chest pains, palpitations and leg swelling Denies abdominal pain, nausea, vomiting,diarrhea or constipation.   Denies dysuria, frequency, hesitancy or incontinence. Denies joint pain, swelling and limitation in mobility. Denies headaches, seizures, numbness, or tingling. Denies depression, anxiety or insomnia.        Objective:   Physical Exam BP 108/64 mmHg  Pulse 98  Resp 18  Ht 5\' 5"  (1.651 m)  Wt 197 lb (89.359 kg)  BMI 32.78 kg/m2  SpO2 96%   Patient alert and oriented and in no cardiopulmonary distress. HEENT: No facial asymmetry, EOMI,   oropharynx pink and moist.  Neck supple no JVD, no mass. Left outer and inner ear normal, non tender, clear TM with good light reflex, Right outer ear tender, external ear canal erythematous and edematous, no drainage from ear, good light reflex and normal Tm in right ear Chest: Clear to auscultation bilaterally.  CVS: S1, S2 no murmurs, no S3.Regular rate.  ABD: Soft non tender.   Ext: No edema  MS: Adequate ROM spine, shoulders, hips and knees.  Skin: cellulitis of left leg,lateral aspect , affecting approx proximal 2/3  Of the leg, no drainage or skin breakdown presnet  Psych: Good eye contact, normal affect. Memory intact not anxious or depressed appearing.  CNS: CN 2-12 intact, power,  normal throughout.no focal deficits noted.        Assessment & Plan:  Acute  otitis externa 4 day history of right ear pain and fullness, no trauma, no drainage , no hearing loss septra x 1 0 days  Cellulitis of leg, left Left lower extremity warmth and redness, no skin breakdown, acute cellulitis , septra x 10 days work excuse from 6/21 to 04/10/2015  Hyperlipidemia LDL goal <100 Hyperlipidemia:Low fat diet discussed and encouraged.   Lipid Panel  Lab Results  Component Value Date   CHOL 231* 12/05/2014   HDL 110 12/05/2014   LDLCALC 100* 12/05/2014   TRIG 107 12/05/2014   CHOLHDL 2.1 12/05/2014   Updated lab needed at/ before next visit.      Obesity (BMI 30.0-34.9) Deteriorated. Patient re-educated about  the importance of commitment to a  minimum of 150 minutes of exercise per week.  The importance of healthy food choices with portion control discussed. Encouraged to start a food diary, count calories and to consider  joining a support group. Sample diet sheets offered. Goals set by the patient for the next several months.   Weight /BMI 04/02/2015 12/01/2014 10/04/2014  WEIGHT 197 lb 192 lb 185 lb  HEIGHT 5\' 5"  5\' 5"  5\' 3"   BMI 32.78 kg/m2 31.95 kg/m2 32.78 kg/m2    Current exercise per week  60 minutes.   Essential hypertension Controlled, no change in medication DASH diet and commitment to daily physical activity for a minimum of 30 minutes discussed and encouraged, as a part of hypertension management. The importance  of attaining a healthy weight is also discussed.  BP/Weight 04/02/2015 12/01/2014 10/04/2014 07/24/2014 05/03/2014 09/28/2013 99/69/2493  Systolic BP 241 991 444 584 835 075 732  Diastolic BP 64 90 85 90 90 82 64  Wt. (Lbs) 197 192 185 196 190 186.12 188.08  BMI 32.78 31.95 32.78 32.62 32.6 30.97 31.3

## 2015-04-19 NOTE — Assessment & Plan Note (Signed)
Deteriorated. Patient re-educated about  the importance of commitment to a  minimum of 150 minutes of exercise per week.  The importance of healthy food choices with portion control discussed. Encouraged to start a food diary, count calories and to consider  joining a support group. Sample diet sheets offered. Goals set by the patient for the next several months.   Weight /BMI 04/02/2015 12/01/2014 10/04/2014  WEIGHT 197 lb 192 lb 185 lb  HEIGHT 5\' 5"  5\' 5"  5\' 3"   BMI 32.78 kg/m2 31.95 kg/m2 32.78 kg/m2    Current exercise per week  60 minutes.

## 2015-04-19 NOTE — Assessment & Plan Note (Signed)
Controlled, no change in medication DASH diet and commitment to daily physical activity for a minimum of 30 minutes discussed and encouraged, as a part of hypertension management. The importance of attaining a healthy weight is also discussed.  BP/Weight 04/02/2015 12/01/2014 10/04/2014 07/24/2014 05/03/2014 09/28/2013 70/26/3785  Systolic BP 885 027 741 287 867 672 094  Diastolic BP 64 90 85 90 90 82 64  Wt. (Lbs) 197 192 185 196 190 186.12 188.08  BMI 32.78 31.95 32.78 32.62 32.6 30.97 31.3

## 2015-05-15 ENCOUNTER — Telehealth: Payer: Self-pay

## 2015-05-15 ENCOUNTER — Other Ambulatory Visit: Payer: Self-pay | Admitting: Family Medicine

## 2015-05-15 NOTE — Telephone Encounter (Signed)
Called stating she has been breaking out again on her face and neck since Sunday. Wants [facility]. Last appt June 20. Please advise

## 2015-05-15 NOTE — Telephone Encounter (Signed)
[  pls refill prednisone x 1 , let her know

## 2015-05-16 ENCOUNTER — Other Ambulatory Visit: Payer: Self-pay

## 2015-05-16 ENCOUNTER — Ambulatory Visit: Payer: Managed Care, Other (non HMO) | Admitting: Family Medicine

## 2015-05-16 MED ORDER — PREDNISONE 5 MG (21) PO TBPK: ORAL_TABLET | ORAL | Status: DC

## 2015-05-16 NOTE — Telephone Encounter (Signed)
Prednisone sent into pharmacy.  

## 2015-05-16 NOTE — Telephone Encounter (Signed)
Patient aware and med sent  

## 2015-05-23 ENCOUNTER — Telehealth: Payer: Self-pay | Admitting: Family Medicine

## 2015-05-23 DIAGNOSIS — H9201 Otalgia, right ear: Secondary | ICD-10-CM

## 2015-05-23 DIAGNOSIS — L03116 Cellulitis of left lower limb: Secondary | ICD-10-CM

## 2015-05-23 DIAGNOSIS — E669 Obesity, unspecified: Secondary | ICD-10-CM

## 2015-05-23 DIAGNOSIS — H6091 Unspecified otitis externa, right ear: Secondary | ICD-10-CM

## 2015-05-23 DIAGNOSIS — H60391 Other infective otitis externa, right ear: Secondary | ICD-10-CM

## 2015-05-23 DIAGNOSIS — E785 Hyperlipidemia, unspecified: Secondary | ICD-10-CM

## 2015-05-23 NOTE — Addendum Note (Signed)
Addended by: Denman George B on: 05/23/2015 02:12 PM   Modules accepted: Orders

## 2015-05-23 NOTE — Telephone Encounter (Signed)
Referral  to ENT in [place]( for soon appt ) left ear pain entered, plsd follow thru with appt info and make her aware , thanks

## 2015-05-23 NOTE — Telephone Encounter (Signed)
Patient states that areas are behind her right ear and continues to have ear problems that she was having at last visit on 6/20.  Please advise would you like to refer to ent or see for ov?

## 2015-05-23 NOTE — Telephone Encounter (Signed)
Patient would like latest possible appointment please

## 2015-05-23 NOTE — Telephone Encounter (Signed)
Patient has 2 places on the back of her neck, it is like 2 hard knots on her Rt side around her hair line, her whole right ear hurts, she states that its throbbing and aching, please advise?

## 2015-05-24 NOTE — Telephone Encounter (Signed)
Dr. Benjamine Mola will see Ms. [name] on September 1st at 1:10 in the [place] office, right now that is the first available at this time. The [place] office does not accept her insurance. She is on the wait list. Office notes have been faxed

## 2015-05-25 ENCOUNTER — Encounter (HOSPITAL_COMMUNITY): Payer: Self-pay | Admitting: Emergency Medicine

## 2015-05-25 ENCOUNTER — Emergency Department (HOSPITAL_COMMUNITY)
Admission: EM | Admit: 2015-05-25 | Discharge: 2015-05-25 | Disposition: A | Discharge status: Home / Self Care | Payer: Managed Care, Other (non HMO) | Attending: Emergency Medicine | Admitting: Emergency Medicine

## 2015-05-25 DIAGNOSIS — L089 Local infection of the skin and subcutaneous tissue, unspecified: Secondary | ICD-10-CM | POA: Diagnosis present

## 2015-05-25 DIAGNOSIS — L03221 Cellulitis of neck: Secondary | ICD-10-CM | POA: Diagnosis not present

## 2015-05-25 DIAGNOSIS — I10 Essential (primary) hypertension: Secondary | ICD-10-CM | POA: Insufficient documentation

## 2015-05-25 DIAGNOSIS — Z79899 Other long term (current) drug therapy: Secondary | ICD-10-CM | POA: Diagnosis not present

## 2015-05-25 DIAGNOSIS — Z7951 Long term (current) use of inhaled steroids: Secondary | ICD-10-CM | POA: Insufficient documentation

## 2015-05-25 DIAGNOSIS — J45909 Unspecified asthma, uncomplicated: Secondary | ICD-10-CM | POA: Insufficient documentation

## 2015-05-25 DIAGNOSIS — Z880 Allergy status to penicillin: Secondary | ICD-10-CM | POA: Insufficient documentation

## 2015-05-25 DIAGNOSIS — Z7952 Long term (current) use of systemic steroids: Secondary | ICD-10-CM | POA: Insufficient documentation

## 2015-05-25 MED ORDER — SULFAMETHOXAZOLE-TRIMETHOPRIM 800-160 MG PO TABS
1.0000 | ORAL_TABLET | Freq: Once | ORAL | Status: AC
Start: 2015-05-25 — End: 2015-05-25
  Administered 2015-05-25: 1 via ORAL
  Filled 2015-05-25: qty 1

## 2015-05-25 MED ORDER — OXYCODONE-ACETAMINOPHEN 5-325 MG PO TABS: 1.0000 | ORAL_TABLET | Freq: Four times a day (QID) | ORAL | Status: DC | PRN

## 2015-05-25 MED ORDER — SULFAMETHOXAZOLE-TRIMETHOPRIM 800-160 MG PO TABS: 1.0000 | ORAL_TABLET | Freq: Two times a day (BID) | ORAL | Status: AC

## 2015-05-25 NOTE — ED Notes (Signed)
Pt has 2 areas back of her head /at hair line that became swollen and painful after being popped by her husband .

## 2015-05-25 NOTE — Discharge Instructions (Signed)
Cellulitis Cellulitis is an infection of the skin and the tissue under the skin. The infected area is usually red and tender. This happens most often in the arms and lower legs. HOME CARE   Take your antibiotic medicine as told. Finish the medicine even if you start to feel better.  Keep the infected arm or leg raised (elevated).  Put a warm cloth on the area up to 4 times per day.  Only take medicines as told by your doctor.  Keep all doctor visits as told. GET HELP IF:  You see red streaks on the skin coming from the infected area.  Your red area gets bigger or turns a dark color.  Your bone or joint under the infected area is painful after the skin heals.  Your infection comes back in the same area or different area.  You have a puffy (swollen) bump in the infected area.  You have new symptoms.  You have a fever. GET HELP RIGHT AWAY IF:   You feel very sleepy.  You throw up (vomit) or have watery poop (diarrhea).  You feel sick and have muscle aches and pains. MAKE SURE YOU:   Understand these instructions.  Will watch your condition.  Will get help right away if you are not doing well or get worse. Document Released: 03/17/2008 Document Revised: 02/13/2014 Document Reviewed: 12/15/2011 [facility] Patient Information 2015 [place], [place]. This information is not intended to replace advice given to you by your health care provider. Make sure you discuss any questions you have with your health care provider.  Keep area scrupulously clean. Warm moist clean wash rag. Antibiotic twice a day. Pain medicine. Return if worse.

## 2015-05-25 NOTE — ED Provider Notes (Signed)
CSN: [id number]     Arrival date & time 05/25/15  1249 History   First MD Initiated Contact with Patient 05/25/15 1300     Chief Complaint  Patient presents with  . Skin Problem     (Consider location/radiation/quality/duration/timing/severity/associated sxs/prior Treatment) HPI..... Tender erythematous area on the back of patient's neck for several days. Husband attempted to "pop" an abscess there initially. No fever, chills, stiff neck. Patient is not diabetic. No radiation of pain.  Past Medical History  Diagnosis Date  . Hypertension   . Asthma 2010  . Eczema since childhoosd    flare since 2011   Past Surgical History  Procedure Laterality Date  . Knee surgery    . Back surgery    . Tubal ligation     History reviewed. No pertinent family history. Social History  Substance Use Topics  . Smoking status: Never Smoker   . Smokeless tobacco: None  . Alcohol Use: Yes     Comment: occassionally   OB History    No data available     Review of Systems  All other systems reviewed and are negative.     Allergies  Penicillins  Home Medications   Prior to Admission medications   Medication Sig Start Date End Date Taking? Authorizing Provider  albuterol (PROVENTIL HFA;VENTOLIN HFA) 108 (90 BASE) MCG/ACT inhaler Inhale 2 puffs into the lungs every 6 (six) hours as needed for wheezing or shortness of breath. 02/07/15   Fayrene Helper, MD  amLODipine (NORVASC) 10 MG tablet TAKE 1 TABLET (10 MG TOTAL) BY MOUTH DAILY. 01/29/15   Fayrene Helper, MD  budesonide-formoterol [facility]) 160-4.5 MCG/ACT inhaler Inhale 2 puffs into the lungs 2 (two) times daily. 02/07/15   Fayrene Helper, MD  fluticasone (FLONASE) 50 MCG/ACT nasal spray Place 2 sprays into both nostrils daily. 12/01/14   Fayrene Helper, MD  hydrocortisone 2.5 % cream APPLY TO AFFECTED AREA ON FACE TWICE A DAY 03/24/15   Historical Provider, MD  hydrOXYzine (ATARAX/VISTARIL) 10 MG tablet TAKE 1 TO 2  TABLETS BY MOUTH AT BEDTIME AS NEEDED FOR ITCHING 01/11/15   Fayrene Helper, MD  ipratropium-albuterol (DUONEB) 0.5-2.5 (3) MG/3ML SOLN USE ONE VIAL VIA NEBULIZER EVERY 8 HOURS AS NEEDED, FOR SEVERE WHEEZING AS DIRECTED 03/09/15   Fayrene Helper, MD  montelukast (SINGULAIR) 10 MG tablet TAKE 1 TABLET (10 MG TOTAL) BY MOUTH DAILY. 01/29/15   Fayrene Helper, MD  oxyCODONE-acetaminophen (PERCOCET/ROXICET) 5-325 MG per tablet Take 1-2 tablets by mouth every 6 (six) hours as needed. 05/25/15   Nat Christen, MD  predniSONE (STERAPRED UNI-PAK 21 TAB) 5 MG (21) TBPK tablet Take as directed on package 05/16/15   Fayrene Helper, MD  sulfamethoxazole-trimethoprim (BACTRIM DS,SEPTRA DS) 800-160 MG per tablet Take 1 tablet by mouth 2 (two) times daily. 05/25/15 06/01/15  Nat Christen, MD  tacrolimus (PROTOPIC) 0.1 % ointment APPLY TO FACE ONCE OR TWICE DAILY AS DIRECTED 05/16/15   Fayrene Helper, MD  triamcinolone ointment (KENALOG) 0.1 % Apply 1 application topically 2 (two) times daily.  03/10/14   Historical Provider, MD  Vitamin D, Ergocalciferol, (DRISDOL) 50000 UNITS CAPS capsule Take 1 capsule (50,000 Units total) by mouth every 7 (seven) days. 12/22/14   Fayrene Helper, MD   BP 127/89 mmHg  Pulse 96  Temp(Src) 98.6 F (37 C) (Oral)  Resp 18  Ht 5\' 4"  (1.626 m)  Wt 197 lb (89.359 kg)  BMI 33.80 kg/m2  SpO2 99%  LMP 05/18/2015 (Exact Date) Physical Exam  Constitutional: She is oriented to person, place, and time. She appears well-developed and well-nourished.  HENT:  Head: Normocephalic and atraumatic.  Eyes: Conjunctivae and EOM are normal. Pupils are equal, round, and reactive to light.  Neck:  Area of induration, erythema approximately 9 x 4 cm on the inferior posterior aspect of the neck  Cardiovascular: Normal rate and regular rhythm.   Pulmonary/Chest: Effort normal and breath sounds normal.  Abdominal: Soft. Bowel sounds are normal.  Musculoskeletal: Normal range of motion.   Neurological: She is alert and oriented to person, place, and time.  Skin: Skin is warm and dry.  Psychiatric: She has a normal mood and affect. Her behavior is normal.  Nursing note and vitals reviewed.   ED Course  Procedures (including critical care time) Labs Review Labs Reviewed - No data to display  Imaging Review No results found. I, Britania Shreeve, personally reviewed and evaluated these images and lab results as part of my medical decision-making.   EKG Interpretation None      MDM   Final diagnoses:  Cellulitis of neck    Patient has obvious cellulitis on the posterior aspect of the neck. Rx Septra DS and Percocet. Instructed to return if worse.    Nat Christen, MD 05/25/15 1415

## 2015-06-01 ENCOUNTER — Other Ambulatory Visit: Payer: Self-pay

## 2015-06-01 MED ORDER — FLUCONAZOLE 150 MG PO TABS: 150.0000 mg | ORAL_TABLET | Freq: Once | ORAL | Status: DC

## 2015-06-11 ENCOUNTER — Other Ambulatory Visit: Payer: Self-pay | Admitting: Family Medicine

## 2015-06-13 ENCOUNTER — Ambulatory Visit: Payer: Managed Care, Other (non HMO) | Admitting: Family Medicine

## 2015-06-14 ENCOUNTER — Ambulatory Visit (INDEPENDENT_AMBULATORY_CARE_PROVIDER_SITE_OTHER): Payer: Self-pay | Admitting: Otolaryngology

## 2015-07-10 ENCOUNTER — Ambulatory Visit: Payer: Managed Care, Other (non HMO) | Admitting: Family Medicine

## 2015-07-13 ENCOUNTER — Other Ambulatory Visit: Payer: Self-pay | Admitting: Family Medicine

## 2015-07-19 ENCOUNTER — Ambulatory Visit (INDEPENDENT_AMBULATORY_CARE_PROVIDER_SITE_OTHER): Payer: Self-pay | Admitting: Otolaryngology

## 2015-07-26 DIAGNOSIS — J45909 Unspecified asthma, uncomplicated: Secondary | ICD-10-CM

## 2015-08-01 ENCOUNTER — Other Ambulatory Visit: Payer: Self-pay | Admitting: "Endocrinology

## 2015-08-03 ENCOUNTER — Other Ambulatory Visit: Payer: Self-pay | Admitting: "Endocrinology

## 2015-08-07 ENCOUNTER — Other Ambulatory Visit: Payer: Self-pay | Admitting: Family Medicine

## 2015-08-15 ENCOUNTER — Other Ambulatory Visit: Payer: Self-pay

## 2015-08-15 ENCOUNTER — Other Ambulatory Visit: Payer: Self-pay | Admitting: "Endocrinology

## 2015-08-15 MED ORDER — LEVOTHYROXINE SODIUM 50 MCG PO TABS: 50.0000 ug | ORAL_TABLET | Freq: Every morning | ORAL | Status: DC

## 2015-08-27 ENCOUNTER — Telehealth: Payer: Self-pay | Admitting: Family Medicine

## 2015-08-27 ENCOUNTER — Telehealth: Payer: Self-pay

## 2015-08-27 DIAGNOSIS — J45901 Unspecified asthma with (acute) exacerbation: Secondary | ICD-10-CM

## 2015-08-27 NOTE — Telephone Encounter (Signed)
Letter composed to employer stating that patient can use personal neb at work.  Faxed to staff nurse.

## 2015-08-27 NOTE — Telephone Encounter (Signed)
Patient has questions regarding her FMLA forms

## 2015-08-27 NOTE — Telephone Encounter (Signed)
Called and left message for patient to return call.  

## 2015-08-27 NOTE — Telephone Encounter (Signed)
See next telephone message. 

## 2015-08-27 NOTE — Telephone Encounter (Signed)
This level of uncontrolled asthma is not acceptable dangerous to her health She was referred since April to a specialist and cancelled She needs to have a specialist take care of her asthma, explain this to her and I will see if she can get an appt this week with pulmonary Since we have no OV, tele calls or ED visits to support the very frequent absences because of her asthma I am unable to send a note verifying this to HR at thsio time. She is best served  By [employer]

## 2015-08-28 NOTE — Telephone Encounter (Signed)
Spoke with patient and she would like to be referred back to see pulmonology in [place].  She is asking for an ASAP appt after 1 and is requesting that we call her with appointment.   Referral entered.

## 2015-08-28 NOTE — Addendum Note (Signed)
Addended by: Denman George B on: 08/28/2015 10:04 AM   Modules accepted: Orders

## 2015-08-29 ENCOUNTER — Other Ambulatory Visit (INDEPENDENT_AMBULATORY_CARE_PROVIDER_SITE_OTHER): Payer: Managed Care, Other (non HMO)

## 2015-08-29 ENCOUNTER — Encounter: Payer: Self-pay | Admitting: Internal Medicine

## 2015-08-29 ENCOUNTER — Ambulatory Visit (INDEPENDENT_AMBULATORY_CARE_PROVIDER_SITE_OTHER): Payer: Managed Care, Other (non HMO) | Admitting: Internal Medicine

## 2015-08-29 ENCOUNTER — Other Ambulatory Visit: Payer: Self-pay

## 2015-08-29 VITALS — BP 128/64 | HR 98 | Ht 62.0 in | Wt 195.2 lb

## 2015-08-29 DIAGNOSIS — R0600 Dyspnea, unspecified: Secondary | ICD-10-CM | POA: Diagnosis not present

## 2015-08-29 DIAGNOSIS — J4551 Severe persistent asthma with (acute) exacerbation: Secondary | ICD-10-CM | POA: Diagnosis not present

## 2015-08-29 LAB — CBC WITH DIFFERENTIAL/PLATELET
Basophils Absolute: 0 10*3/uL (ref 0.0–0.1)
Basophils Relative: 0.5 % (ref 0.0–3.0)
EOS ABS: 0.4 10*3/uL (ref 0.0–0.7)
Eosinophils Relative: 5.1 % — ABNORMAL HIGH (ref 0.0–5.0)
HCT: 40.5 % (ref 36.0–46.0)
HEMOGLOBIN: 13.2 g/dL (ref 12.0–15.0)
Lymphocytes Relative: 13.8 % (ref 12.0–46.0)
Lymphs Abs: 1.2 10*3/uL (ref 0.7–4.0)
MCHC: 32.5 g/dL (ref 30.0–36.0)
MCV: 85.5 fl (ref 78.0–100.0)
MONO ABS: 0.7 10*3/uL (ref 0.1–1.0)
Monocytes Relative: 8.6 % (ref 3.0–12.0)
Neutro Abs: 6.2 10*3/uL (ref 1.4–7.7)
Neutrophils Relative %: 72 % (ref 43.0–77.0)
Platelets: 309 10*3/uL (ref 150.0–400.0)
RBC: 4.73 Mil/uL (ref 3.87–5.11)
RDW: 14.1 % (ref 11.5–15.5)
WBC: 8.6 10*3/uL (ref 4.0–10.5)

## 2015-08-29 LAB — NITRIC OXIDE: NITRIC OXIDE: 25

## 2015-08-29 MED ORDER — PREDNISONE 10 MG PO TABS: ORAL_TABLET | ORAL | Status: DC

## 2015-08-29 MED ORDER — TACROLIMUS 0.1 % EX OINT: TOPICAL_OINTMENT | CUTANEOUS | Status: DC

## 2015-08-29 NOTE — Patient Instructions (Addendum)
symbicort 160 Take 2 puffs first thing in am and then another 2 puffs about 12 hours later.     Only use your albuterol inhaler as a rescue medication to be used if you can't catch your breath by resting or doing a relaxed purse lip breathing pattern.  - The less you use it, the better it will work when you need it. - Ok to use up to 2 puffs  every 4 hours if you must but call for immediate appointment if use goes up over your usual need - Don't leave home without it !!  (think of it like the spare tire for your car)   Only [place] the nebulizer if your try the inhaler first and it doesn't work   Prednisone 10 mg take  4 each am x 2 days,   2 each am x 2 days,  1 each am x 2 days and stop   Please remember to go to the lab   department downstairs for your tests - we will call you with the results when they are available.  Please schedule a follow up office visit in 2 weeks, call sooner if needed

## 2015-08-29 NOTE — Progress Notes (Signed)
Subjective:    Patient ID: [name], female    DOB: 1966/03/06,    MRN: [id number]  HPI  77 yobf never smoker  exzema age  49 for a few years and then nasal symptoms runny esp  fall > winter also spring  rx singulair (not clear it did anything) in her 36's then around age 69 which was around 2013 first asthma attack and never 100% worse with smells and change of seasons with lots of cough wheezing and sneezing and on daily meds still having breakthru so referred to pulmonary clinic 08/29/2015    08/29/2015 1st [facility] office visit/ Bowie Doiron   Chief Complaint  Patient presents with  . Pulmonary Consult    Referred by Dr. Tula Nakayama. Pt c/o Asthma approx 2.5 yrs ago. She states that she has "2-3 episodes per month"- she gets SOB and starts coughing, sometimes requires a visit to the ER. She uses rescue inhaler 3-4 x per day and neb "just when I have an episode".  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 No unusual exp hx or h/o childhood pna/ asthma or knowledge of premature birth.  Sleeping ok without nocturnal  or early am exacerbation  of respiratory  c/o's or need for noct saba. Also denies any obvious fluctuation of symptoms with weather or environmental changes or other aggravating or alleviating factors except as outlined above   Current Medications, Allergies, Complete Past Medical History, Past Surgical History, Family History, and Social History were reviewed in [employer] [facility] record.         Review of Systems  Constitutional: Negative for fever, chills and  unexpected weight change.  HENT: Negative for congestion, dental problem, ear pain, nosebleeds, postnasal drip, rhinorrhea, sinus pressure, sneezing, sore throat, trouble swallowing and voice change.   Eyes: Negative for visual disturbance.  Respiratory: Positive for cough and shortness of breath. Negative for choking.   Cardiovascular: Negative for chest pain and leg swelling.  Gastrointestinal: Negative for vomiting, abdominal pain and diarrhea.  Genitourinary: Negative for difficulty urinating.  Musculoskeletal: Negative for arthralgias.  Skin: Positive for rash.  Neurological: Negative for tremors, syncope and headaches.  Hematological: Does not bruise/bleed easily.       Objective:   Physical Exam  amb bf with malar rash   Wt Readings from Last 3 Encounters:  08/29/15 195 lb 3.2 oz (88.542 kg)  05/25/15 197 lb (89.359 kg)  04/02/15 197 lb (89.359 kg)    Vital signs reviewed   HEENT: nl dentition, turbinates, and oropharynx. Nl external ear canals without cough reflex- moderate bilateral non specific turbinate edema with watery d/c   NECK :  without JVD/Nodes/TM/ nl carotid upstrokes bilaterally   LUNGS: no acc muscle use, clear to A and P bilaterally without cough on insp or exp maneuvers   CV:  RRR  no s3 or murmur or increase in P2, no edema   ABD:  soft and nontender with nl excursion in the supine position. No bruits or organomegaly, bowel sounds  nl  MS:  warm without deformities, calf tenderness, cyanosis or clubbing  SKIN: warm and dry without lesions    NEURO:  alert, approp, no deficits    Labs ordered 08/29/2015 allergy profile  NO 08/29/2015 = 25 (low)        Assessment & Plan:

## 2015-08-30 ENCOUNTER — Encounter: Payer: Self-pay | Admitting: Internal Medicine

## 2015-08-30 LAB — ALLERGY FULL PROFILE
ALLERGEN, D PTERNOYSSINUS, D1: 0.17 kU/L — AB
ASPERGILLUS FUMIGATUS M3: 0.27 kU/L — AB
Allergen,Goose feathers, e70: 0.18 kU/L — ABNORMAL HIGH
Alternaria Alternata: 0.12 kU/L — ABNORMAL HIGH
BERMUDA GRASS: 0.11 kU/L — AB
Bahia Grass: <0.1 kU/L
Box Elder IgE: 0.18 kU/L — ABNORMAL HIGH
CANDIDA ALBICANS: 1.72 kU/L — AB
CAT DANDER: 0.44 kU/L — AB
Common Ragweed: 0.19 kU/L — ABNORMAL HIGH
Curvularia lunata: 0.45 kU/L — ABNORMAL HIGH
D. farinae: 0.21 kU/L — ABNORMAL HIGH
Dog Dander: 0.18 kU/L — ABNORMAL HIGH
ELM IGE: 0.16 kU/L — AB
Fescue: 0.12 kU/L — ABNORMAL HIGH
G005 Rye, Perennial: 0.14 kU/L — ABNORMAL HIGH
G009 Red Top: 0.13 kU/L — ABNORMAL HIGH
Goldenrod: <0.1 kU/L
Helminthosporium halodes: 0.26 kU/L — ABNORMAL HIGH
House Dust Hollister: 0.25 kU/L — ABNORMAL HIGH
IgE (Immunoglobulin E), Serum: 3435 kU/L — ABNORMAL HIGH (ref <115)
Lamb's Quarters: 0.18 kU/L — ABNORMAL HIGH
OAK CLASS: 0.11 kU/L — AB
Plantain: 0.18 kU/L — ABNORMAL HIGH
Sycamore Tree: 0.12 kU/L — ABNORMAL HIGH
Timothy Grass: 0.14 kU/L — ABNORMAL HIGH

## 2015-08-30 NOTE — Assessment & Plan Note (Addendum)
-   Spirometry 08/29/2015 FEV1 0.67 (30%) ratio 44  - NO 08/29/2015 = 25  - 08/29/2015  Walked RA x 3 laps @ 185 ft each stopped due to  End of study, fast pace,   Sob but no  desat  08/29/2015  extensive    The hx of childhood favors allergic asthma as does the response to prednisone but the NO is surprisingly low in the midst of one of her typical flares where needs freq saba and can't get across a parking lot s limiting sob and I suspect she has occupational asthma of some sort but either way her symptoms are difficult to control. DDX of  difficult airways management all start with A and  include Adherence, Ace Inhibitors, Acid Reflux, Active Sinus Disease, Alpha 1 Antitripsin deficiency, Anxiety masquerading as Airways dz,  ABPA,  allergy(esp in young), Aspiration (esp in elderly), Adverse effects of meds,  Active smokers, A bunch of PE's (a small clot burden can't cause this syndrome unless there is already severe underlying pulm or vascular dz with poor reserve) plus two Bs  = Bronchiectasis and Beta blocker use..and one C= CHF   Adherence is always the initial "prime suspect" and is a multilayered concern that requires a "trust but verify" approach in every patient - starting with knowing how to use medications, especially inhalers, correctly, keeping up with refills and understanding the fundamental difference between maintenance and prns vs those medications only taken for a very short course and then stopped and not refilled.  - The proper method of use, as well as anticipated side effects, of a metered-dose inhaler are discussed and demonstrated to the patient. Improved effectiveness after extensive coaching during this visit to a level of approximately  75% from a baseline of 25% so try increase symbicort to 160 2bid consistently   ? Acid (or non-acid) GERD > always difficult to exclude as up to 75% of pts in some series report no assoc GI/ Heartburn symptoms> rec consider trial of max (24h)   acid suppression and diet  If not responding to max symbicort .   ? Allergy > already on singulair/ rx Prednisone 10 mg take  4 each am x 2 days,   2 each am x 2 days,  1 each am x 2 days and stop  send allergy profile   I had an extended discussion with the patient reviewing all relevant studies completed to date and  lasting 35 minutes of a 60 minute visit    Each maintenance medication was reviewed in detail including most importantly the difference between maintenance and prns and under what circumstances the prns are to be triggered using an action plan format that is not reflected in the computer generated alphabetically organized AVS.    Please see instructions for details which were reviewed in writing and the patient given a copy highlighting the part that I personally wrote and discussed at today's ov.

## 2015-08-30 NOTE — Assessment & Plan Note (Signed)
Complicated by hbp  Body mass index is 35.69   Lab Results  Component Value Date   TSH 6.091* 12/05/2014     Contributing to gerd tendency/ doe/reviewed the need and the process to achieve and maintain neg calorie balance > defer f/u primary care including intermittently monitoring thyroid status

## 2015-09-20 ENCOUNTER — Ambulatory Visit: Payer: Managed Care, Other (non HMO) | Admitting: Family Medicine

## 2015-09-24 ENCOUNTER — Ambulatory Visit: Payer: Self-pay | Admitting: Internal Medicine

## 2015-10-03 ENCOUNTER — Other Ambulatory Visit: Payer: Self-pay | Admitting: Family Medicine

## 2015-10-10 ENCOUNTER — Ambulatory Visit (INDEPENDENT_AMBULATORY_CARE_PROVIDER_SITE_OTHER): Payer: Managed Care, Other (non HMO) | Admitting: Family Medicine

## 2015-10-10 ENCOUNTER — Encounter: Payer: Self-pay | Admitting: Family Medicine

## 2015-10-10 ENCOUNTER — Other Ambulatory Visit (HOSPITAL_COMMUNITY)
Admission: RE | Admit: 2015-10-10 | Discharge: 2015-10-10 | Disposition: A | Discharge status: Home / Self Care | Payer: Managed Care, Other (non HMO) | Source: Ambulatory Visit | Attending: Family Medicine | Admitting: Family Medicine

## 2015-10-10 VITALS — BP 126/74 | HR 85 | Resp 18 | Wt 194.0 lb

## 2015-10-10 DIAGNOSIS — R7303 Prediabetes: Secondary | ICD-10-CM

## 2015-10-10 DIAGNOSIS — Z1211 Encounter for screening for malignant neoplasm of colon: Secondary | ICD-10-CM | POA: Diagnosis not present

## 2015-10-10 DIAGNOSIS — I10 Essential (primary) hypertension: Secondary | ICD-10-CM

## 2015-10-10 DIAGNOSIS — Z01419 Encounter for gynecological examination (general) (routine) without abnormal findings: Secondary | ICD-10-CM | POA: Diagnosis present

## 2015-10-10 DIAGNOSIS — E038 Other specified hypothyroidism: Secondary | ICD-10-CM

## 2015-10-10 DIAGNOSIS — J4551 Severe persistent asthma with (acute) exacerbation: Secondary | ICD-10-CM

## 2015-10-10 DIAGNOSIS — E785 Hyperlipidemia, unspecified: Secondary | ICD-10-CM | POA: Diagnosis not present

## 2015-10-10 DIAGNOSIS — L309 Dermatitis, unspecified: Secondary | ICD-10-CM

## 2015-10-10 DIAGNOSIS — Z124 Encounter for screening for malignant neoplasm of cervix: Secondary | ICD-10-CM

## 2015-10-10 DIAGNOSIS — Z1151 Encounter for screening for human papillomavirus (HPV): Secondary | ICD-10-CM | POA: Insufficient documentation

## 2015-10-10 DIAGNOSIS — Z114 Encounter for screening for human immunodeficiency virus [HIV]: Secondary | ICD-10-CM

## 2015-10-10 LAB — LIPID PANEL
CHOL/HDL RATIO: 2.1 ratio (ref <=5.0)
Cholesterol: 210 mg/dL — ABNORMAL HIGH (ref 125–200)
HDL: 101 mg/dL (ref >=46)
LDL CALC: 85 mg/dL (ref <130)
TRIGLYCERIDES: 121 mg/dL (ref <150)
VLDL: 24 mg/dL (ref <30)

## 2015-10-10 LAB — TSH: TSH: 4.72 u[IU]/mL — ABNORMAL HIGH (ref 0.350–4.500)

## 2015-10-10 LAB — POC HEMOCCULT BLD/STL (OFFICE/1-CARD/DIAGNOSTIC): FECAL OCCULT BLD: NEGATIVE

## 2015-10-10 LAB — BASIC METABOLIC PANEL
BUN: 9 mg/dL (ref 7–25)
CALCIUM: 9.2 mg/dL (ref 8.6–10.2)
CO2: 25 mmol/L (ref 20–31)
CREATININE: 0.74 mg/dL (ref 0.50–1.10)
Chloride: 102 mmol/L (ref 98–110)
Glucose, Bld: 81 mg/dL (ref 65–99)
Potassium: 4.5 mmol/L (ref 3.5–5.3)
Sodium: 138 mmol/L (ref 135–146)

## 2015-10-10 LAB — HEMOGLOBIN A1C
Hgb A1c MFr Bld: 5.7 % — ABNORMAL HIGH (ref <5.7)
Mean Plasma Glucose: 117 mg/dL — ABNORMAL HIGH (ref <117)

## 2015-10-10 MED ORDER — VITAMIN D (ERGOCALCIFEROL) 1.25 MG (50000 UNIT) PO CAPS: 50000.0000 [IU] | ORAL_CAPSULE | ORAL | Status: DC

## 2015-10-10 NOTE — Progress Notes (Signed)
Subjective:    Patient ID: [name], female    DOB: Nov 17, 1965, 49 y.o.   MRN: [id number]  HPI   MADALYNE Mcmahon     MRN: [id number]      DOB: June 16, 1966   HPI [name] is here for follow up and re-evaluation of chronic medical conditions, medication management and review of any available recent lab and radiology data.  Preventive health is updated, specifically  Cancer screening and Immunization.   Questions or concerns regarding consultations or procedures which the PT has had in the interim are  Addressed.Marked improvement in asthma as well as chronic eczema, pleased with referral, and motivated to becoming more proactive with regard to her health The PT denies any adverse reactions to current medications since the last visit.  There are no new concerns.  There are no specific complaints   ROS Denies recent fever or chills. Denies sinus pressure, nasal congestion, ear pain or sore throat. Denies chest congestion, productive cough or wheezing. Denies chest pains, palpitations and leg swelling Denies abdominal pain, nausea, vomiting,diarrhea or constipation.   Denies dysuria, frequency, hesitancy or incontinence. Denies joint pain, swelling and limitation in mobility. Denies headaches, seizures, numbness, or tingling. Denies depression, anxiety or insomnia. Denies skin break down or rash.   PE  BP 126/74 mmHg  Pulse 85  Resp 18  Wt 194 lb (87.998 kg)  SpO2 98%  Patient alert and oriented and in no cardiopulmonary distress.  HEENT: No facial asymmetry, EOMI,   oropharynx pink and moist.  Neck supple no JVD, no mass.  Chest: Clear to auscultation bilaterally.  CVS: S1, S2 no murmurs, no S3.Regular rate.  ABD: Soft non tender. No organomegaly or mass, normal BS Rectal: no mass, heme negative stool  Pelvic: Normal female distribution of hair, no external lesions or ulceration. Introitus no, labia majora and minora normal Vaginal walls healthy. Physiologic  discharge Uterus normal sized, no adnexal masses, physiologic discharge , cervix appears healthy and is firma  Ext: No edema  MS: Adequate ROM spine, shoulders, hips and knees.  Skin: Intact, no ulcerations or rash noted.  Psych: Good eye contact, normal affect. Memory intact not anxious or depressed appearing.  CNS: CN 2-12 intact, power,  normal throughout.no focal deficits noted.   Assessment & Plan   Essential hypertension Controlled, no change in medication DASH diet and commitment to daily physical activity for a minimum of 30 minutes discussed and encouraged, as a part of hypertension management. The importance of attaining a healthy weight is also discussed.  BP/Weight 10/10/2015 08/29/2015 05/25/2015 04/02/2015 12/01/2014 10/04/2014 123XX123  Systolic BP 123XX123 0000000 [id number] [id number] 0000000 [id number]  Diastolic BP 74 64 89 64 90 85 90  Wt. (Lbs) 194 195.2 197 197 192 185 196  BMI 35.47 35.69 33.8 32.78 31.95 32.78 32.62        Severe persistent chronic asthma with acute exacerbation Marked improvement with proper technique as far as use of MDI's , will continue to f/u with pulmonary  Eczema Controlled, no change in medication   Hyperlipidemia LDL goal <100 Hyperlipidemia:Low fat diet discussed and encouraged.   Lipid Panel  Lab Results  Component Value Date   CHOL 210* 10/10/2015   HDL 101 10/10/2015   LDLCALC 85 10/10/2015   TRIG 121 10/10/2015   CHOLHDL 2.1 10/10/2015   Improved , continue low fat diet    Prediabetes Patient educated about the importance of limiting  Carbohydrate intake , the need to commit to  daily physical activity for a minimum of 30 minutes , and to commit weight loss. The fact that changes in all these areas will reduce or eliminate all together the development of diabetes is stressed.  Imprpoved  Diabetic Labs Latest Ref Rng 10/10/2015 12/05/2014 10/04/2014 10/23/2011 07/11/2011  HbA1c <5.7 % 5.7(H) 5.9(H) - - -  Chol 125 - 200 mg/dL 210(H)  231(H) - - 220(H)  HDL >=46 mg/dL 101 110 - - 77  Calc LDL <130 mg/dL 85 100(H) - - 130(H)  Triglycerides <150 mg/dL 121 107 - - 67  Creatinine 0.50 - 1.10 mg/dL 0.74 - 0.78 0.90 0.86   BP/Weight 10/10/2015 08/29/2015 05/25/2015 04/02/2015 12/01/2014 10/04/2014 123XX123  Systolic BP 123XX123 0000000 [id number] 0000000 [id number]  Diastolic BP 74 64 89 64 90 85 90  Wt. (Lbs) 194 195.2 197 197 192 185 196  BMI 35.47 35.69 33.8 32.78 31.95 32.78 32.62   No flowsheet data found.     Hypothyroidism Improved but not at goal, med adjustment to be made, will need to call pt as lab result available after the visit  Morbid obesity ([place]) Unchanged.  Patient re-educated about  the importance of commitment to a  minimum of 150 minutes of exercise per week.  The importance of healthy food choices with portion control discussed. Encouraged to start a food diary, count calories and to consider  joining a support group. Sample diet sheets offered. Goals set by the patient for the next several months.   Weight /BMI 10/10/2015 08/29/2015 05/25/2015  WEIGHT 194 lb 195 lb 3.2 oz 197 lb  HEIGHT - 5\' 2"  5\' 4"   BMI 35.47 kg/m2 35.69 kg/m2 33.8 kg/m2    Current exercise per week 90 minutes.       Review of Systems     Objective:   Physical Exam        Assessment & Plan:

## 2015-10-10 NOTE — Patient Instructions (Signed)
F/u in 5 month, call if you need me sooner  Please commit to weekly vit D   Weight loss goal of 6 to 10 pounds, choose fruit and vegetable over sweets and starchy foods!  It is important that you exercise regularly at least 30 minutes 5 times a week. If you develop chest pain, have severe difficulty breathing, or feel very tired, stop exercising immediately and seek medical attention   Thankful breathing and skin are much improved  Thanks for choosing [facility], we consider it a privelige to serve you.  All the best for 2017!

## 2015-10-11 ENCOUNTER — Other Ambulatory Visit: Payer: Self-pay

## 2015-10-11 LAB — CYTOLOGY - PAP

## 2015-10-11 LAB — HIV ANTIBODY (ROUTINE TESTING W REFLEX): HIV 1&2 Ab, 4th Generation: NONREACTIVE

## 2015-10-11 MED ORDER — TACROLIMUS 0.1 % EX OINT: TOPICAL_OINTMENT | CUTANEOUS | Status: DC

## 2015-10-11 MED ORDER — LEVOTHYROXINE SODIUM 50 MCG PO TABS: 50.0000 ug | ORAL_TABLET | Freq: Every morning | ORAL | Status: DC

## 2015-10-11 NOTE — Assessment & Plan Note (Signed)
Controlled, no change in medication  

## 2015-10-11 NOTE — Assessment & Plan Note (Signed)
Hyperlipidemia:Low fat diet discussed and encouraged.   Lipid Panel  Lab Results  Component Value Date   CHOL 210* 10/10/2015   HDL 101 10/10/2015   LDLCALC 85 10/10/2015   TRIG 121 10/10/2015   CHOLHDL 2.1 10/10/2015   Improved , continue low fat diet

## 2015-10-11 NOTE — Addendum Note (Signed)
Addended by: Eual Fines on: 10/11/2015 01:42 PM   Modules accepted: Orders

## 2015-10-11 NOTE — Assessment & Plan Note (Signed)
Patient educated about the importance of limiting  Carbohydrate intake , the need to commit to daily physical activity for a minimum of 30 minutes , and to commit weight loss. The fact that changes in all these areas will reduce or eliminate all together the development of diabetes is stressed.  Imprpoved  Diabetic Labs Latest Ref Rng 10/10/2015 12/05/2014 10/04/2014 10/23/2011 07/11/2011  HbA1c <5.7 % 5.7(H) 5.9(H) - - -  Chol 125 - 200 mg/dL 210(H) 231(H) - - 220(H)  HDL >=46 mg/dL 101 110 - - 77  Calc LDL <130 mg/dL 85 100(H) - - 130(H)  Triglycerides <150 mg/dL 121 107 - - 67  Creatinine 0.50 - 1.10 mg/dL 0.74 - 0.78 0.90 0.86   BP/Weight 10/10/2015 08/29/2015 05/25/2015 04/02/2015 12/01/2014 10/04/2014 123XX123  Systolic BP 123XX123 0000000 [id number] [id number] 0000000 [id number]  Diastolic BP 74 64 89 64 90 85 90  Wt. (Lbs) 194 195.2 197 197 192 185 196  BMI 35.47 35.69 33.8 32.78 31.95 32.78 32.62   No flowsheet data found.

## 2015-10-11 NOTE — Assessment & Plan Note (Signed)
Unchanged.  Patient re-educated about  the importance of commitment to a  minimum of 150 minutes of exercise per week.  The importance of healthy food choices with portion control discussed. Encouraged to start a food diary, count calories and to consider  joining a support group. Sample diet sheets offered. Goals set by the patient for the next several months.   Weight /BMI 10/10/2015 08/29/2015 05/25/2015  WEIGHT 194 lb 195 lb 3.2 oz 197 lb  HEIGHT - 5\' 2"  5\' 4"   BMI 35.47 kg/m2 35.69 kg/m2 33.8 kg/m2    Current exercise per week 90 minutes.

## 2015-10-11 NOTE — Assessment & Plan Note (Signed)
Improved but not at goal, med adjustment to be made, will need to call pt as lab result available after the visit

## 2015-10-11 NOTE — Assessment & Plan Note (Signed)
Marked improvement with proper technique as far as use of MDI's , will continue to f/u with pulmonary

## 2015-10-11 NOTE — Assessment & Plan Note (Signed)
Controlled, no change in medication DASH diet and commitment to daily physical activity for a minimum of 30 minutes discussed and encouraged, as a part of hypertension management. The importance of attaining a healthy weight is also discussed.  BP/Weight 10/10/2015 08/29/2015 05/25/2015 04/02/2015 12/01/2014 10/04/2014 123XX123  Systolic BP 123XX123 0000000 [id number] [id number] 0000000 [id number]  Diastolic BP 74 64 89 64 90 85 90  Wt. (Lbs) 194 195.2 197 197 192 185 196  BMI 35.47 35.69 33.8 32.78 31.95 32.78 32.62

## 2015-10-24 ENCOUNTER — Encounter: Payer: Self-pay | Admitting: Internal Medicine

## 2015-10-24 ENCOUNTER — Ambulatory Visit (INDEPENDENT_AMBULATORY_CARE_PROVIDER_SITE_OTHER): Payer: Managed Care, Other (non HMO) | Admitting: Internal Medicine

## 2015-10-24 VITALS — BP 124/82 | HR 91 | Ht 62.0 in | Wt 204.0 lb

## 2015-10-24 DIAGNOSIS — J309 Allergic rhinitis, unspecified: Secondary | ICD-10-CM | POA: Diagnosis not present

## 2015-10-24 DIAGNOSIS — J4551 Severe persistent asthma with (acute) exacerbation: Secondary | ICD-10-CM

## 2015-10-24 MED ORDER — FLUTICASONE PROPIONATE 50 MCG/ACT NA SUSP: NASAL | Status: DC

## 2015-10-24 NOTE — Patient Instructions (Signed)
Work on inhaler technique:  relax and gently blow all the way out then take a nice smooth deep breath back in, triggering the inhaler at same time you start breathing in.  Hold for up to 5 seconds if you can. Blow out thru nose. Rinse and gargle with water when done    I emphasized that nasal steroids(like flonase, which I called in)  have no immediate benefit in terms of improving symptoms.  To help them reached the target tissue, the patient should use Afrin two puffs every 12 hours applied one min before using the nasal steroids.  Afrin should be stopped after no more than 5 days.  If the symptoms worsen, Afrin can be restarted after 5 days off of therapy to prevent rebound congestion from overuse of Afrin.  I also emphasized that in no way are nasal steroids a concern in terms of "addiction".    Please schedule a follow up visit in 3 months but call sooner if needed

## 2015-10-24 NOTE — Progress Notes (Signed)
Subjective:    Patient ID: [name], female    DOB: [birth year],    MRN: [id number]    Brief patient profile:  56 yobf never smoker  eczema age  50 for a few years and then nasal symptoms runny esp  fall > winter also spring  rx singulair (not clear it did anything) in her 34's then around age 89 which was around 2013 first asthma attack and never 100% better, worse with smells and change of seasons with lots of cough wheezing and sneezing and on daily meds still having breakthru so referred to pulmonary clinic 08/29/2015 by Dr Moshe Cipro  History of Present Illness  08/29/2015 1st [facility] office visit/ [name]  / maint rx = singulair  Chief Complaint  Patient presents with  . Pulmonary Consult    Referred by Dr. Tula Nakayama. Pt c/o Asthma approx 2.5 yrs ago. She states that she has "2-3 episodes per month"- she gets SOB and starts coughing, sometimes requires a visit to the ER. She uses rescue inhaler 3-4 x per day and neb "just when I have an episode".  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  Only use your albuterol inhaler as a rescue medication only [place] the nebulizer if your try the inhaler first and it doesn't work  Prednisone 10 mg take  4 each am x 2 days,   2 each am x 2 days,  1 each am x 2 days and stop     10/24/2015  f/u ov/[name] re: asthma on maint rx with singulair/ symb 160  Chief Complaint  Patient presents with  . Follow-up    Pt states that she has had "one attack" since the last visit. She has only used rescue x 1 since last visit.   back at work one attack with sinus flare / Not  limited by breathing from desired activities     No obvious day to day or daytime variability or assoc chronic cough or cp or chest tightness, subjective wheeze or overt sinus or hb symptoms. No unusual exp hx or h/o childhood pna/ asthma or knowledge of premature birth.  Sleeping ok without nocturnal  or early am exacerbation  of respiratory  c/o's or need for noct saba. Also denies any obvious fluctuation of symptoms with weather or environmental changes or other aggravating or alleviating factors except as outlined above   Current Medications, Allergies, Complete Past Medical History, Past Surgical History, Family History, and Social History were reviewed in [employer] record.  ROS  The following are not active complaints unless bolded sore throat, dysphagia, dental problems, itching, sneezing,  nasal congestion or excess/ purulent secretions, ear ache,   fever, chills, sweats, unintended wt loss, classically pleuritic or exertional cp, hemoptysis,  orthopnea pnd or leg swelling, presyncope, palpitations, abdominal pain, anorexia, nausea, vomiting, diarrhea  or change in bowel or bladder habits, change in stools or urine, dysuria,hematuria,  rash, arthralgias, visual complaints, headache, numbness, weakness or ataxia or problems with walking or coordination,  change in mood/affect or  memory.                    Objective:   Physical Exam  amb bf  nad    10/24/2015       204   08/29/15 195 lb 3.2 oz (88.542 kg)  05/25/15 197 lb (89.359 kg)  04/02/15 197 lb (89.359 kg)    Vital signs reviewed   HEENT: nl dentition, turbinates, and oropharynx. Nl external ear canals without cough reflex- moderate bilateral non specific turbinate edema with watery d/c   NECK :  without JVD/Nodes/TM/ nl carotid upstrokes bilaterally   LUNGS: no acc muscle use, clear to A and P bilaterally without cough on insp or exp maneuvers   CV:  RRR  no s3 or murmur or increase in P2,  no edema   ABD:  soft and nontender with nl excursion in the supine position. No bruits or organomegaly, bowel sounds nl  MS:  warm without deformities, calf tenderness, cyanosis or clubbing  SKIN: warm and dry without lesions    NEURO:  alert, approp, no deficits       NO 08/29/2015 = 25 (low)        Assessment & Plan:

## 2015-10-28 NOTE — Assessment & Plan Note (Signed)
I emphasized that nasal steroids have no immediate benefit in terms of improving symptoms.  To help them reached the target tissue, the patient should use Afrin two puffs every 12 hours applied one min before using the nasal steroids.  Afrin should be stopped after no more than 5 days.  If the symptoms worsen, Afrin can be restarted after 5 days off of therapy to prevent rebound congestion from overuse of Afrin.  I also emphasized that in no way are nasal steroids a concern in terms of "addiction".  

## 2015-10-28 NOTE — Assessment & Plan Note (Addendum)
08/29/2015  extensive coaching HFA effectiveness =    75% from a baseline of 25%  - Spirometry 08/29/2015 FEV1 0.67 (30%) ratio 44  - NO 08/29/2015 = 25  - 08/29/2015  Walked RA x 3 laps @ 185 ft each stopped due to  End of study, fast pace,   Sob but no  desat   - Allergy profile 08/29/15 >  IgE 3435 strongly POS RAST   Despite allergies her NO is low and control is excellent on singlair/symbicort 160 2bid despite suboptimal hfa   - The proper method of use, as well as anticipated side effects, of a metered-dose inhaler are discussed and demonstrated to the patient. Improved effectiveness after extensive coaching during this visit to a level of approximately 90 % from a baseline of 75 %   I had an extended discussion with the patient reviewing all relevant studies completed to date and  lasting 15 to 20 minutes of a 25 minute visit    Each maintenance medication was reviewed in detail including most importantly the difference between maintenance and prns and under what circumstances the prns are to be triggered using an action plan format that is not reflected in the computer generated alphabetically organized AVS.    Please see instructions for details which were reviewed in writing and the patient given a copy highlighting the part that I personally wrote and discussed at today's ov.

## 2015-11-09 ENCOUNTER — Other Ambulatory Visit: Payer: Self-pay | Admitting: Family Medicine

## 2015-11-13 ENCOUNTER — Other Ambulatory Visit: Payer: Self-pay

## 2015-11-13 MED ORDER — TRIAMCINOLONE ACETONIDE 0.1 % EX CREA: TOPICAL_CREAM | CUTANEOUS | Status: DC

## 2015-11-24 ENCOUNTER — Other Ambulatory Visit: Payer: Self-pay | Admitting: Family Medicine

## 2015-12-06 ENCOUNTER — Other Ambulatory Visit: Payer: Self-pay

## 2015-12-06 MED ORDER — TRIAMCINOLONE ACETONIDE 0.1 % EX CREA: TOPICAL_CREAM | CUTANEOUS | Status: DC

## 2015-12-06 MED ORDER — TACROLIMUS 0.1 % EX OINT: TOPICAL_OINTMENT | CUTANEOUS | Status: DC

## 2015-12-31 ENCOUNTER — Other Ambulatory Visit: Payer: Self-pay

## 2015-12-31 MED ORDER — TRIAMCINOLONE ACETONIDE 0.1 % EX CREA: TOPICAL_CREAM | CUTANEOUS | Status: DC

## 2016-01-14 ENCOUNTER — Telehealth: Payer: Self-pay

## 2016-01-14 MED ORDER — PREDNISONE 5 MG (21) PO TBPK: 5.0000 mg | ORAL_TABLET | ORAL | Status: DC

## 2016-01-14 NOTE — Telephone Encounter (Signed)
Pt aware.

## 2016-01-14 NOTE — Telephone Encounter (Signed)
C/o flare up of eczema on her face/neck. Has appt next month. Wants prednisone called in for it. She is about to go on a field trip to [place] and its really bothering her. Please advise

## 2016-01-14 NOTE — Telephone Encounter (Signed)
Script sent pls let her know 

## 2016-01-21 ENCOUNTER — Other Ambulatory Visit: Payer: Self-pay

## 2016-01-21 MED ORDER — TRIAMCINOLONE ACETONIDE 0.1 % EX CREA: TOPICAL_CREAM | CUTANEOUS | Status: DC

## 2016-01-22 ENCOUNTER — Ambulatory Visit: Payer: Self-pay | Admitting: Internal Medicine

## 2016-02-08 ENCOUNTER — Other Ambulatory Visit: Payer: Self-pay | Admitting: Family Medicine

## 2016-02-25 ENCOUNTER — Other Ambulatory Visit: Payer: Self-pay | Admitting: Family Medicine

## 2016-02-29 ENCOUNTER — Other Ambulatory Visit: Payer: Self-pay | Admitting: Family Medicine

## 2016-03-11 ENCOUNTER — Ambulatory Visit: Payer: Self-pay | Admitting: Family Medicine

## 2016-03-26 ENCOUNTER — Other Ambulatory Visit: Payer: Self-pay

## 2016-03-26 MED ORDER — HYDROXYZINE HCL 10 MG PO TABS: ORAL_TABLET | ORAL | Status: DC

## 2016-04-01 ENCOUNTER — Ambulatory Visit: Payer: Self-pay | Admitting: Family Medicine

## 2016-04-01 ENCOUNTER — Telehealth: Payer: Self-pay | Admitting: Family Medicine

## 2016-04-01 ENCOUNTER — Ambulatory Visit (INDEPENDENT_AMBULATORY_CARE_PROVIDER_SITE_OTHER): Payer: Managed Care, Other (non HMO)

## 2016-04-01 ENCOUNTER — Ambulatory Visit (INDEPENDENT_AMBULATORY_CARE_PROVIDER_SITE_OTHER): Payer: Managed Care, Other (non HMO) | Admitting: Podiatry

## 2016-04-01 ENCOUNTER — Encounter: Payer: Self-pay | Admitting: Podiatry

## 2016-04-01 VITALS — BP 137/88 | HR 69 | Resp 12

## 2016-04-01 DIAGNOSIS — E785 Hyperlipidemia, unspecified: Secondary | ICD-10-CM

## 2016-04-01 DIAGNOSIS — E559 Vitamin D deficiency, unspecified: Secondary | ICD-10-CM

## 2016-04-01 DIAGNOSIS — M722 Plantar fascial fibromatosis: Secondary | ICD-10-CM

## 2016-04-01 DIAGNOSIS — R7303 Prediabetes: Secondary | ICD-10-CM

## 2016-04-01 DIAGNOSIS — E038 Other specified hypothyroidism: Secondary | ICD-10-CM

## 2016-04-01 DIAGNOSIS — I10 Essential (primary) hypertension: Secondary | ICD-10-CM

## 2016-04-01 MED ORDER — MELOXICAM 15 MG PO TABS: 15.0000 mg | ORAL_TABLET | Freq: Every day | ORAL | Status: DC

## 2016-04-01 NOTE — Telephone Encounter (Signed)
Pt has no appt , last here in 09/2015, missed f/u Needs TSH , fasting lipid, cmp and EGFR and vit D 1 week before f/u, appt within 42 to 4 weeks if possible pls Also needs mammo, she can schedule

## 2016-04-01 NOTE — Patient Instructions (Signed)
Plantar Fasciitis Plantar fasciitis is a painful foot condition that affects the heel. It occurs when the band of tissue that connects the toes to the heel bone (plantar fascia) becomes irritated. This can happen after exercising too much or doing other repetitive activities (overuse injury). The pain from plantar fasciitis can range from mild irritation to severe pain that makes it difficult for you to walk or move. The pain is usually worse in the morning or after you have been sitting or lying down for a while. CAUSES This condition may be caused by:  Standing for long periods of time.  Wearing shoes that do not fit.  Doing high-impact activities, including running, aerobics, and ballet.  Being overweight.  Having an abnormal way of walking (gait).  Having tight calf muscles.  Having high arches in your feet.  Starting a new athletic activity. SYMPTOMS The main symptom of this condition is heel pain. Other symptoms include:  Pain that gets worse after activity or exercise.  Pain that is worse in the morning or after resting.  Pain that goes away after you walk for a few minutes. DIAGNOSIS This condition may be diagnosed based on your signs and symptoms. Your health care provider will also do a physical exam to check for:  A tender area on the bottom of your foot.  A high arch in your foot.  Pain when you move your foot.  Difficulty moving your foot. You may also need to have imaging studies to confirm the diagnosis. These can include:  X-rays.  Ultrasound.  MRI. TREATMENT  Treatment for plantar fasciitis depends on the severity of the condition. Your treatment may include:  Rest, ice, and over-the-counter pain medicines to manage your pain.  Exercises to stretch your calves and your plantar fascia.  A splint that holds your foot in a stretched, upward position while you sleep (night splint).  Physical therapy to relieve symptoms and prevent problems in the  future.  Cortisone injections to relieve severe pain.  Extracorporeal shock wave therapy (ESWT) to stimulate damaged plantar fascia with electrical impulses. It is often used as a last resort before surgery.  Surgery, if other treatments have not worked after 12 months. HOME CARE INSTRUCTIONS  Take medicines only as directed by your health care provider.  Avoid activities that cause pain.  Roll the bottom of your foot over a bag of ice or a bottle of cold water. Do this for 20 minutes, 3-4 times a day.  Perform simple stretches as directed by your health care provider.  Try wearing athletic shoes with air-sole or gel-sole cushions or soft shoe inserts.  Wear a night splint while sleeping, if directed by your health care provider.  Keep all follow-up appointments with your health care provider. PREVENTION   Do not perform exercises or activities that cause heel pain.  Consider finding low-impact activities if you continue to have problems.  Lose weight if you need to. The best way to prevent plantar fasciitis is to avoid the activities that aggravate your plantar fascia. SEEK MEDICAL CARE IF:  Your symptoms do not go away after treatment with home care measures.  Your pain gets worse.  Your pain affects your ability to move or do your daily activities.   This information is not intended to replace advice given to you by your health care provider. Make sure you discuss any questions you have with your health care provider.   Document Released: 06/24/2001 Document Revised: 06/20/2015 Document Reviewed: 08/09/2014 Elsevier   Interactive Patient Education 2016 [employer]  

## 2016-04-01 NOTE — Progress Notes (Signed)
She presents today as a nutritional chief complaint of pain to the right heel. She states is been going on now for about 2 months and denies any trauma. States that the first step out of bed in the morning is severely painful. She has a history of MRSA as well as eczema and is currently being treated with antibiotic for staph infection.  Objective: Vital signs are stable alert and oriented 3 have reviewed her past medical history medications allergy surgery social history and review of systems. She cannot recall the name of the current antibiotic that she is on. Vital signs are stable alert and oriented 3. Pulses are palpable. Neurologic sensorium is intact deep tendon reflexes intact muscle strength is normal bilateral. Orthopedic evaluation demonstrates mild pes planus with pain on palpation medial calcaneal tubercle of the right heel. Radiographs confirm soft tissue increase in density of plantar fascial calcaneal insertion site of the right heel. Cutaneous evaluation does demonstrate systemic eczema.  Assessment: Plantar fasciitis right heel.  Plan: I injected the right heel today with local anesthetic Western plantar fascia brace right foot. She is also placement of a night splint for bedtime. I wrote her prescription for meloxicam. We did not write a prescription for a Medrol Dosepak since she is currently treating MRSA.

## 2016-04-03 NOTE — Telephone Encounter (Signed)
Patient aware.  Appointment scheduled.  Will have labs done the week prior.

## 2016-04-03 NOTE — Addendum Note (Signed)
Addended by: Denman George B on: 04/03/2016 09:49 AM   Modules accepted: Orders

## 2016-04-07 ENCOUNTER — Telehealth: Payer: Self-pay | Admitting: *Deleted

## 2016-04-07 NOTE — Telephone Encounter (Signed)
Sounds good to me.  She really needs to wear the brace.

## 2016-04-07 NOTE — Telephone Encounter (Signed)
Pt states her foot hurts worse in the steel-toed shoes with the brace, what can she do?  Left message instructing pt to make sure the brace in comfortable, before putting the foot and brace into the steel toed boot, ice 3-4 times daily for 10-15 mins each time and take the Meloxicam for the inflammation and pain, I would ask Dr. Milinda Pointer for other advise.

## 2016-04-14 ENCOUNTER — Ambulatory Visit (INDEPENDENT_AMBULATORY_CARE_PROVIDER_SITE_OTHER): Payer: Managed Care, Other (non HMO) | Admitting: Family Medicine

## 2016-04-14 ENCOUNTER — Encounter: Payer: Self-pay | Admitting: Family Medicine

## 2016-04-14 VITALS — BP 128/88 | HR 88 | Resp 16 | Ht 62.0 in | Wt 196.0 lb

## 2016-04-14 DIAGNOSIS — L309 Dermatitis, unspecified: Secondary | ICD-10-CM

## 2016-04-14 DIAGNOSIS — R9389 Abnormal findings on diagnostic imaging of other specified body structures: Secondary | ICD-10-CM

## 2016-04-14 DIAGNOSIS — R946 Abnormal results of thyroid function studies: Secondary | ICD-10-CM

## 2016-04-14 DIAGNOSIS — R7303 Prediabetes: Secondary | ICD-10-CM

## 2016-04-14 DIAGNOSIS — J3089 Other allergic rhinitis: Secondary | ICD-10-CM | POA: Diagnosis not present

## 2016-04-14 DIAGNOSIS — J4550 Severe persistent asthma, uncomplicated: Secondary | ICD-10-CM

## 2016-04-14 DIAGNOSIS — I10 Essential (primary) hypertension: Secondary | ICD-10-CM

## 2016-04-14 DIAGNOSIS — Z1239 Encounter for other screening for malignant neoplasm of breast: Secondary | ICD-10-CM | POA: Diagnosis not present

## 2016-04-14 DIAGNOSIS — E785 Hyperlipidemia, unspecified: Secondary | ICD-10-CM

## 2016-04-14 NOTE — Patient Instructions (Signed)
F/U end November, call if you need me before  Mammogram is past due, pls keep appt Weight loss goal of 8 to 10 pounds   Lab report will be sent to your home  Please work on good  health habits so that your health will improve. 1. Commitment to daily physical activity for 30 to 60  minutes, if you are able to do this.  2. Commitment to wise food choices. Aim for half of your  food intake to be vegetable and fruit, one quarter starchy foods, and one quarter protein. Try to eat on a regular schedule  3 meals per day, snacking between meals should be limited to vegetables or fruits or small portions of nuts. 64 ounces of water per day is generally recommended, unless you have specific health conditions, like heart failure or kidney failure where you will need to limit fluid intake.  3. Commitment to sufficient and a  good quality of physical and mental rest daily, generally between 6 to 8 hours per day.  WITH PERSISTANCE AND PERSEVERANCE, THE IMPOSSIBLE , BECOMES THE NORM!   Thanks for choosing [facility], we consider it a privelige to serve you.

## 2016-04-15 DIAGNOSIS — J4550 Severe persistent asthma, uncomplicated: Secondary | ICD-10-CM | POA: Insufficient documentation

## 2016-04-15 LAB — COMPLETE METABOLIC PANEL WITH GFR
ALBUMIN: 3.9 g/dL (ref 3.6–5.1)
ALK PHOS: 70 U/L (ref 33–115)
ALT: 16 U/L (ref 6–29)
AST: 19 U/L (ref 10–35)
BUN: 16 mg/dL (ref 7–25)
CALCIUM: 8.9 mg/dL (ref 8.6–10.2)
CHLORIDE: 103 mmol/L (ref 98–110)
CO2: 25 mmol/L (ref 20–31)
Creat: 0.85 mg/dL (ref 0.50–1.10)
GFR, EST NON AFRICAN AMERICAN: 81 mL/min (ref >=60)
Glucose, Bld: 82 mg/dL (ref 65–99)
POTASSIUM: 4.4 mmol/L (ref 3.5–5.3)
Sodium: 137 mmol/L (ref 135–146)
Total Bilirubin: 0.4 mg/dL (ref 0.2–1.2)
Total Protein: 6.2 g/dL (ref 6.1–8.1)

## 2016-04-15 LAB — LIPID PANEL
CHOL/HDL RATIO: 1.9 ratio (ref <=5.0)
Cholesterol: 209 mg/dL — ABNORMAL HIGH (ref 125–200)
HDL: 111 mg/dL (ref >=46)
LDL Cholesterol: 87 mg/dL (ref <130)
Triglycerides: 56 mg/dL (ref <150)
VLDL: 11 mg/dL (ref <30)

## 2016-04-15 LAB — VITAMIN D 25 HYDROXY (VIT D DEFICIENCY, FRACTURES): VIT D 25 HYDROXY: 33 ng/mL (ref 30–100)

## 2016-04-15 LAB — TSH: TSH: 2.01 m[IU]/L

## 2016-04-15 NOTE — Assessment & Plan Note (Signed)
Hyperlipidemia:Low fat diet discussed and encouraged.   Lipid Panel  Lab Results  Component Value Date   CHOL 209* 04/14/2016   HDL 111 04/14/2016   LDLCALC 87 04/14/2016   TRIG 56 04/14/2016   CHOLHDL 1.9 04/14/2016   Improved

## 2016-04-15 NOTE — Assessment & Plan Note (Signed)
Patient educated about the importance of limiting  Carbohydrate intake , the need to commit to daily physical activity for a minimum of 30 minutes , and to commit weight loss. The fact that changes in all these areas will reduce or eliminate all together the development of diabetes is stressed.  Improved  Diabetic Labs Latest Ref Rng 04/14/2016 10/10/2015 12/05/2014 10/04/2014 10/23/2011  HbA1c <5.7 % - 5.7(H) 5.9(H) - -  Chol 125 - 200 mg/dL 209(H) 210(H) 231(H) - -  HDL >=46 mg/dL 111 101 110 - -  Calc LDL <130 mg/dL 87 85 100(H) - -  Triglycerides <150 mg/dL 56 121 107 - -  Creatinine 0.50 - 1.10 mg/dL 0.85 0.74 - 0.78 0.90   BP/Weight 04/14/2016 04/01/2016 10/24/2015 10/10/2015 08/29/2015 05/25/2015 123456  Systolic BP 0000000 0000000 [id number] 0000000 [id number]  Diastolic BP 88 88 82 74 64 89 64  Wt. (Lbs) 196 - 204 194 195.2 197 197  BMI 35.84 - 37.3 35.47 35.69 33.8 32.78   No flowsheet data found.

## 2016-04-15 NOTE — Assessment & Plan Note (Signed)
Referred to ENT for f/u in 05/2015

## 2016-04-15 NOTE — Assessment & Plan Note (Signed)
Controlled, no change in medication DASH diet and commitment to daily physical activity for a minimum of 30 minutes discussed and encouraged, as a part of hypertension management. The importance of attaining a healthy weight is also discussed.  BP/Weight 04/14/2016 04/01/2016 10/24/2015 10/10/2015 08/29/2015 05/25/2015 123456  Systolic BP 0000000 0000000 [id number] 0000000 [id number]  Diastolic BP 88 88 82 74 64 89 64  Wt. (Lbs) 196 - 204 194 195.2 197 197  BMI 35.84 - 37.3 35.47 35.69 33.8 32.78

## 2016-04-15 NOTE — Assessment & Plan Note (Signed)
Well controlled on current regime, and followed by pulmonary

## 2016-04-15 NOTE — Assessment & Plan Note (Signed)
Recently had flare involving hands and neck, being treated by dermatology for staph infection also

## 2016-04-15 NOTE — Assessment & Plan Note (Signed)
Improved Patient re-educated about  the importance of commitment to a  minimum of 150 minutes of exercise per week.  The importance of healthy food choices with portion control discussed. Encouraged to start a food diary, count calories and to consider  joining a support group. Sample diet sheets offered. Goals set by the patient for the next several months.   Weight /BMI 04/14/2016 10/24/2015 10/10/2015  WEIGHT 196 lb 204 lb 194 lb  HEIGHT 5\' 2"  5\' 2"  -  BMI 35.84 kg/m2 37.3 kg/m2 35.47 kg/m2

## 2016-04-15 NOTE — Progress Notes (Signed)
Subjective:    Patient ID: [name], female    DOB: [birth year], 50 y.o.   MRN: [id number]  HPI   [name]     MRN: [id number]      DOB: 06-09-1966   HPI [name] is here for follow up and re-evaluation of chronic medical conditions, medication management and review of any available recent lab and radiology data.  Preventive health is updated, specifically  Cancer screening and Immunization.   Questions or concerns regarding consultations or procedures which the PT has had in the interim are  Addressed.Beinf treated by dermatology for severe eczema complicated by staph infection currently The PT denies any adverse reactions to current medications since the last visit.  There are no new concerns.  There are no specific complaints   ROS Denies recent fever or chills. Denies sinus pressure, nasal congestion, ear pain or sore throat. Denies chest congestion, productive cough or wheezing. Denies chest pains, palpitations and leg swelling Denies abdominal pain, nausea, vomiting,diarrhea or constipation.   Denies dysuria, frequency, hesitancy or incontinence. Denies joint pain, swelling and limitation in mobility. Denies headaches, seizures, numbness, or tingling. Denies depression, anxiety or insomnia. [name]   PE  BP 128/88 mmHg  Pulse 88  Resp 16  Ht 5\' 2"  (1.575 m)  Wt 196 lb (88.905 kg)  BMI 35.84 kg/m2  SpO2 96%  Patient alert and oriented and in no cardiopulmonary distress.  HEENT: No facial asymmetry, EOMI,   oropharynx pink and moist.  Neck supple no JVD, no mass.  Chest: Clear to auscultation bilaterally.  CVS: S1, S2 no murmurs, no S3.Regular rate.  ABD: Soft non tender.   Ext: No edema  MS: Adequate ROM spine, shoulders, hips and knees.  Skin: hyperpigmentation and scarring of dorsum of hands where being treated for recent flare with staph superinfectionm  Psych: Good eye contact, normal affect. Memory intact not anxious or depressed  appearing.  CNS: CN 2-12 intact, power,  normal throughout.no focal deficits noted.   Assessment & Plan  Essential hypertension Controlled, no change in medication DASH diet and commitment to daily physical activity for a minimum of 30 minutes discussed and encouraged, as a part of hypertension management. The importance of attaining a healthy weight is also discussed.  BP/Weight 04/14/2016 04/01/2016 10/24/2015 10/10/2015 08/29/2015 05/25/2015 123456  Systolic BP 0000000 0000000 [id number] 0000000 [id number]  Diastolic BP 88 88 82 74 64 89 64  Wt. (Lbs) 196 - 204 194 195.2 197 197  BMI 35.84 - 37.3 35.47 35.69 33.8 32.78        Allergic rhinitis Controlled, no change in medication   Eczema Recently had flare involving hands and neck, being treated by dermatology for staph infection also  Hyperlipidemia LDL goal <100 Hyperlipidemia:Low fat diet discussed and encouraged.   Lipid Panel  Lab Results  Component Value Date   CHOL 209* 04/14/2016   HDL 111 04/14/2016   LDLCALC 87 04/14/2016   TRIG 56 04/14/2016   CHOLHDL 1.9 04/14/2016   Improved     Morbid obesity (HCC) Improved Patient re-educated about  the importance of commitment to a  minimum of 150 minutes of exercise per week.  The importance of healthy food choices with portion control discussed. Encouraged to start a food diary, count calories and to consider  joining a support group. Sample diet sheets offered. Goals set by the patient for the next several months.   Weight /BMI 04/14/2016 10/24/2015 10/10/2015  WEIGHT 196 lb 204  lb 194 lb  HEIGHT 5\' 2"  5\' 2"  -  BMI 35.84 kg/m2 37.3 kg/m2 35.47 kg/m2       Prediabetes Patient educated about the importance of limiting  Carbohydrate intake , the need to commit to daily physical activity for a minimum of 30 minutes , and to commit weight loss. The fact that changes in all these areas will reduce or eliminate all together the development of diabetes is stressed.   Improved  Diabetic Labs Latest Ref Rng 04/14/2016 10/10/2015 12/05/2014 10/04/2014 10/23/2011  HbA1c <5.7 % - 5.7(H) 5.9(H) - -  Chol 125 - 200 mg/dL 209(H) 210(H) 231(H) - -  HDL >=46 mg/dL 111 101 110 - -  Calc LDL <130 mg/dL 87 85 100(H) - -  Triglycerides <150 mg/dL 56 121 107 - -  Creatinine 0.50 - 1.10 mg/dL 0.85 0.74 - 0.78 0.90   BP/Weight 04/14/2016 04/01/2016 10/24/2015 10/10/2015 08/29/2015 05/25/2015 123456  Systolic BP 0000000 0000000 [id number] 0000000 [id number]  Diastolic BP 88 88 82 74 64 89 64  Wt. (Lbs) 196 - 204 194 195.2 197 197  BMI 35.84 - 37.3 35.47 35.69 33.8 32.78   No flowsheet data found.     Abnormal thyroid ultrasound Referred to ENT for f/u in 05/2015  Severe persistent asthma Well controlled on current regime, and followed by pulmonary       Review of Systems     Objective:   Physical Exam        Assessment & Plan:

## 2016-04-15 NOTE — Assessment & Plan Note (Signed)
Controlled, no change in medication  

## 2016-04-18 ENCOUNTER — Other Ambulatory Visit: Payer: Self-pay

## 2016-04-18 MED ORDER — HYDROXYZINE HCL 10 MG PO TABS: ORAL_TABLET | ORAL | Status: DC

## 2016-04-28 ENCOUNTER — Ambulatory Visit (HOSPITAL_COMMUNITY): Payer: Self-pay

## 2016-05-05 ENCOUNTER — Ambulatory Visit (HOSPITAL_COMMUNITY)
Admission: RE | Admit: 2016-05-05 | Discharge: 2016-05-05 | Disposition: A | Discharge status: Home / Self Care | Payer: Managed Care, Other (non HMO) | Source: Ambulatory Visit | Attending: Family Medicine | Admitting: Family Medicine

## 2016-05-05 DIAGNOSIS — Z1231 Encounter for screening mammogram for malignant neoplasm of breast: Secondary | ICD-10-CM | POA: Insufficient documentation

## 2016-05-05 DIAGNOSIS — Z1239 Encounter for other screening for malignant neoplasm of breast: Secondary | ICD-10-CM

## 2016-05-06 ENCOUNTER — Ambulatory Visit: Payer: Managed Care, Other (non HMO) | Admitting: Podiatry

## 2016-05-10 ENCOUNTER — Other Ambulatory Visit: Payer: Self-pay | Admitting: Family Medicine

## 2016-05-10 DIAGNOSIS — J45909 Unspecified asthma, uncomplicated: Secondary | ICD-10-CM

## 2016-05-12 ENCOUNTER — Other Ambulatory Visit: Payer: Self-pay | Admitting: Family Medicine

## 2016-05-12 ENCOUNTER — Telehealth: Payer: Self-pay

## 2016-05-12 MED ORDER — FLUTICASONE-SALMETEROL 250-50 MCG/DOSE IN AEPB: 1.0000 | INHALATION_SPRAY | Freq: Two times a day (BID) | RESPIRATORY_TRACT | 4 refills | Status: DC

## 2016-05-12 NOTE — Telephone Encounter (Signed)
advair sent in , nmay need to call pulmonologist if this is not as effective pls let her knwo

## 2016-05-12 NOTE — Telephone Encounter (Signed)
symbicort not covered. Alternatives are Breo, Advair and Dulera. Ok to change to one of those?

## 2016-05-12 NOTE — Telephone Encounter (Signed)
Medication sent to pharmacy  

## 2016-05-13 NOTE — Telephone Encounter (Signed)
Called and left voicemail for patient notifying of rx and the need to see pulmonology if med is ineffective

## 2016-05-21 ENCOUNTER — Other Ambulatory Visit: Payer: Self-pay

## 2016-05-21 MED ORDER — ALBUTEROL SULFATE HFA 108 (90 BASE) MCG/ACT IN AERS: 2.0000 | INHALATION_SPRAY | Freq: Four times a day (QID) | RESPIRATORY_TRACT | 2 refills | Status: DC | PRN

## 2016-05-28 ENCOUNTER — Other Ambulatory Visit: Payer: Self-pay | Admitting: Family Medicine

## 2016-05-29 ENCOUNTER — Ambulatory Visit: Payer: Managed Care, Other (non HMO) | Admitting: Podiatry

## 2016-06-03 DIAGNOSIS — Z0289 Encounter for other administrative examinations: Secondary | ICD-10-CM

## 2016-06-05 ENCOUNTER — Ambulatory Visit (INDEPENDENT_AMBULATORY_CARE_PROVIDER_SITE_OTHER): Payer: Managed Care, Other (non HMO) | Admitting: Podiatry

## 2016-06-05 ENCOUNTER — Encounter: Payer: Self-pay | Admitting: Podiatry

## 2016-06-05 DIAGNOSIS — M722 Plantar fascial fibromatosis: Secondary | ICD-10-CM | POA: Diagnosis not present

## 2016-06-05 DIAGNOSIS — M66871 Spontaneous rupture of other tendons, right ankle and foot: Secondary | ICD-10-CM

## 2016-06-05 NOTE — Progress Notes (Signed)
She presents today for follow-up of her plantar fasciitis. She states that she has had absolutely no improvement. As a matter fact she states that it seems to be getting worse as time goes on and she points to the medial aspect of her right foot. She states that that he'll is not only painful at the medial side of the ankle is painful as well she denies any trauma.  Objective: Vital signs are stable she is alert and oriented 3. Pulses are palpable. She is severe pain on inversion against resistance and pain on palpation of the posterior tibial tendon as it courses beneath the medial malleolus and with the lightest touch to the navicular tuberosity.  Assessment: Posterior tibial tendon tear with pes planus and plantar fasciitis right foot.  Plan: I dispensed a cam walker today to prevent further damage to her posterior tibial tendon and I requested that she remain out of work until further noticed. I also requested an MRI as soon as possible to the right rear foot and ankle. I expect this is a tear that will need to go onto surgery.

## 2016-06-06 ENCOUNTER — Telehealth: Payer: Self-pay | Admitting: *Deleted

## 2016-06-06 DIAGNOSIS — M722 Plantar fascial fibromatosis: Secondary | ICD-10-CM

## 2016-06-06 NOTE — Telephone Encounter (Addendum)
-----   Message from Rip Harbour, [facility] sent at 06/05/2016  5:10 PM EDT ----- Regarding: MRI-UPDATE This pt wants [facility] late day appt. 06/06/2016-Left message for pt to let me know if she would still like to be scheduled at [facility], since there is a MRI scheduled at [place] 06/11/2016.

## 2016-06-07 ENCOUNTER — Other Ambulatory Visit: Payer: Self-pay | Admitting: Family Medicine

## 2016-06-11 ENCOUNTER — Encounter: Payer: Self-pay | Admitting: Podiatry

## 2016-06-11 ENCOUNTER — Ambulatory Visit
Admission: RE | Admit: 2016-06-11 | Discharge: 2016-06-11 | Disposition: A | Discharge status: Home / Self Care | Payer: Managed Care, Other (non HMO) | Source: Ambulatory Visit | Attending: Podiatry | Admitting: Podiatry

## 2016-06-11 DIAGNOSIS — M66871 Spontaneous rupture of other tendons, right ankle and foot: Secondary | ICD-10-CM

## 2016-06-13 NOTE — Telephone Encounter (Addendum)
-----   Message from Garrel Ridgel, [place] sent at 06/12/2016  1:32 PM EDT ----- Please ask for an over read and inform patient of the delay. 06/13/2016-Mailed MRI disc copy to [facility]. Left message informing pt of the delay the MRI Disc copy was being sent for a more indepth reading.

## 2016-06-17 ENCOUNTER — Encounter: Payer: Self-pay | Admitting: Podiatry

## 2016-06-17 ENCOUNTER — Ambulatory Visit (INDEPENDENT_AMBULATORY_CARE_PROVIDER_SITE_OTHER): Payer: Managed Care, Other (non HMO) | Admitting: Podiatry

## 2016-06-17 DIAGNOSIS — M66871 Spontaneous rupture of other tendons, right ankle and foot: Secondary | ICD-10-CM | POA: Diagnosis not present

## 2016-06-17 DIAGNOSIS — M722 Plantar fascial fibromatosis: Secondary | ICD-10-CM | POA: Diagnosis not present

## 2016-06-17 NOTE — Progress Notes (Signed)
She presents today for follow-up of her posterior tibial tendinitis and possible tendon tear of her right foot. The initial MRI states that it is not torn. She states that she has had no improvement. She states that her foot does not hurt as long she wears her boot.  Objective: Vital signs are stable she is alert and oriented 3. I have requested an over read for the MRI of her right ankle. I feel that more than likely she does have an insertional tear of the posterior tibial tendon at the navicular tuberosity.  Assessment: MRI states posterior tibial tendinitis without tear.  Plan: I injected the area today with dexamethasone and local anesthetic awaiting the results of the over read. We did discuss the probable need for surgical intervention she understands this and is amenable to it if necessary.

## 2016-06-23 ENCOUNTER — Other Ambulatory Visit: Payer: Self-pay

## 2016-06-23 MED ORDER — TRIAMCINOLONE ACETONIDE 0.1 % EX CREA: TOPICAL_CREAM | CUTANEOUS | 0 refills | Status: DC

## 2016-07-01 ENCOUNTER — Ambulatory Visit (INDEPENDENT_AMBULATORY_CARE_PROVIDER_SITE_OTHER): Payer: Managed Care, Other (non HMO) | Admitting: Podiatry

## 2016-07-01 ENCOUNTER — Encounter: Payer: Self-pay | Admitting: Podiatry

## 2016-07-01 DIAGNOSIS — M66871 Spontaneous rupture of other tendons, right ankle and foot: Secondary | ICD-10-CM | POA: Diagnosis not present

## 2016-07-01 NOTE — Progress Notes (Signed)
She presents today for follow-up of her posterior tibialis tendinitis right foot. She states that it feels much better in the boot but I can't work like this. She states that the plantar aspect of the heel is still painful as well.  Objective: Vital signs are stable she is alert and oriented 3. Pulses are palpable. I have reviewed her past mental history medications and allergies. She still has swelling and pain along the medial aspect of the right foot and medial ankle. She has pain on palpation medial calcaneal tubercle of the right heel. I reviewed her over read the MRI report which did demonstrate interstitial tearing of the tibialis posterior tendon as it courses beneath the medial malleolus extending to the navicular tuberosity. It also related moderate severe plantar fasciitis.  Assessment: Chronic plantar fasciitis with a tear of the posterior tibial tendon right foot and ankle.  Plan: Today we consented her for a Kidner procedure with repair of the posterior tibial tendon and an endoscopic fasciotomy on the right foot and ankle with a below-knee cast. I answered all questions regarding these procedures to the best of my ability in layman's terms. She understood this was amenable to it and final revision of the consent form. We did discuss the possible postop complications which may include but are not limited to postop pain bleeding swelling infection recurrence need for further surgery overcorrection under correction loss of digit loss of limb loss of life. I will follow up with her in approximately 3 weeks for surgical intervention.

## 2016-07-01 NOTE — Patient Instructions (Signed)

## 2016-07-02 DIAGNOSIS — M66871 Spontaneous rupture of other tendons, right ankle and foot: Secondary | ICD-10-CM

## 2016-07-17 ENCOUNTER — Other Ambulatory Visit: Payer: Self-pay | Admitting: Podiatry

## 2016-07-17 MED ORDER — OXYCODONE-ACETAMINOPHEN 10-325 MG PO TABS: ORAL_TABLET | ORAL | 0 refills | Status: DC

## 2016-07-17 MED ORDER — CLINDAMYCIN HCL 150 MG PO CAPS: 150.0000 mg | ORAL_CAPSULE | Freq: Three times a day (TID) | ORAL | 0 refills | Status: DC

## 2016-07-17 MED ORDER — PROMETHAZINE HCL 25 MG PO TABS: 25.0000 mg | ORAL_TABLET | Freq: Three times a day (TID) | ORAL | 0 refills | Status: DC | PRN

## 2016-07-18 ENCOUNTER — Encounter (HOSPITAL_COMMUNITY): Payer: Self-pay | Admitting: Emergency Medicine

## 2016-07-18 ENCOUNTER — Emergency Department (HOSPITAL_COMMUNITY)
Admission: EM | Admit: 2016-07-18 | Discharge: 2016-07-18 | Disposition: A | Discharge status: Home / Self Care | Payer: Managed Care, Other (non HMO) | Attending: Emergency Medicine | Admitting: Emergency Medicine

## 2016-07-18 ENCOUNTER — Emergency Department (HOSPITAL_COMMUNITY)
Admission: EM | Admit: 2016-07-18 | Discharge: 2016-07-18 | Disposition: A | Discharge status: Home / Self Care | Payer: Managed Care, Other (non HMO) | Source: Home / Self Care | Attending: Emergency Medicine | Admitting: Emergency Medicine

## 2016-07-18 DIAGNOSIS — J45909 Unspecified asthma, uncomplicated: Secondary | ICD-10-CM | POA: Insufficient documentation

## 2016-07-18 DIAGNOSIS — T7840XA Allergy, unspecified, initial encounter: Secondary | ICD-10-CM

## 2016-07-18 DIAGNOSIS — L03116 Cellulitis of left lower limb: Secondary | ICD-10-CM

## 2016-07-18 DIAGNOSIS — I10 Essential (primary) hypertension: Secondary | ICD-10-CM | POA: Diagnosis not present

## 2016-07-18 DIAGNOSIS — Z79899 Other long term (current) drug therapy: Secondary | ICD-10-CM | POA: Diagnosis not present

## 2016-07-18 DIAGNOSIS — J029 Acute pharyngitis, unspecified: Secondary | ICD-10-CM | POA: Insufficient documentation

## 2016-07-18 DIAGNOSIS — T370X5A Adverse effect of sulfonamides, initial encounter: Secondary | ICD-10-CM | POA: Diagnosis present

## 2016-07-18 DIAGNOSIS — Z792 Long term (current) use of antibiotics: Secondary | ICD-10-CM

## 2016-07-18 DIAGNOSIS — E039 Hypothyroidism, unspecified: Secondary | ICD-10-CM | POA: Insufficient documentation

## 2016-07-18 LAB — CBC WITH DIFFERENTIAL/PLATELET
Basophils Absolute: 0 10*3/uL (ref 0.0–0.1)
Basophils Relative: 0 %
EOS ABS: 0.2 10*3/uL (ref 0.0–0.7)
EOS PCT: 2 %
HCT: 40.2 % (ref 36.0–46.0)
HEMOGLOBIN: 13.2 g/dL (ref 12.0–15.0)
LYMPHS ABS: 1.4 10*3/uL (ref 0.7–4.0)
LYMPHS PCT: 14 %
MCH: 28.3 pg (ref 26.0–34.0)
MCHC: 32.8 g/dL (ref 30.0–36.0)
MCV: 86.1 fL (ref 78.0–100.0)
MONOS PCT: 8 %
Monocytes Absolute: 0.8 10*3/uL (ref 0.1–1.0)
Neutro Abs: 7.9 10*3/uL — ABNORMAL HIGH (ref 1.7–7.7)
Neutrophils Relative %: 76 %
PLATELETS: 359 10*3/uL (ref 150–400)
RBC: 4.67 MIL/uL (ref 3.87–5.11)
RDW: 14 % (ref 11.5–15.5)
WBC: 10.2 10*3/uL (ref 4.0–10.5)

## 2016-07-18 LAB — BASIC METABOLIC PANEL
Anion gap: 7 (ref 5–15)
BUN: 11 mg/dL (ref 6–20)
CHLORIDE: 103 mmol/L (ref 101–111)
CO2: 26 mmol/L (ref 22–32)
CREATININE: 0.73 mg/dL (ref 0.44–1.00)
Calcium: 9 mg/dL (ref 8.9–10.3)
GFR calc Af Amer: >60 mL/min (ref >60)
GFR calc non Af Amer: >60 mL/min (ref >60)
GLUCOSE: 94 mg/dL (ref 65–99)
POTASSIUM: 3.7 mmol/L (ref 3.5–5.1)
SODIUM: 136 mmol/L (ref 135–145)

## 2016-07-18 MED ORDER — DIPHENHYDRAMINE HCL 25 MG PO TABS: 25.0000 mg | ORAL_TABLET | Freq: Four times a day (QID) | ORAL | 0 refills | Status: DC

## 2016-07-18 MED ORDER — SULFAMETHOXAZOLE-TRIMETHOPRIM 800-160 MG PO TABS: 1.0000 | ORAL_TABLET | Freq: Two times a day (BID) | ORAL | 0 refills | Status: DC

## 2016-07-18 MED ORDER — DOXYCYCLINE HYCLATE 100 MG PO CAPS: 100.0000 mg | ORAL_CAPSULE | Freq: Two times a day (BID) | ORAL | 0 refills | Status: DC

## 2016-07-18 MED ORDER — FAMOTIDINE 20 MG PO TABS
20.0000 mg | ORAL_TABLET | Freq: Once | ORAL | Status: AC
  Administered 2016-07-18: 20 mg via ORAL
  Filled 2016-07-18: qty 1

## 2016-07-18 MED ORDER — DIPHENHYDRAMINE HCL 25 MG PO CAPS
25.0000 mg | ORAL_CAPSULE | Freq: Once | ORAL | Status: AC
  Administered 2016-07-18: 25 mg via ORAL
  Filled 2016-07-18: qty 1

## 2016-07-18 MED ORDER — FAMOTIDINE 20 MG PO TABS: 20.0000 mg | ORAL_TABLET | Freq: Every day | ORAL | 0 refills | Status: DC

## 2016-07-18 MED ORDER — PREDNISONE 50 MG PO TABS
60.0000 mg | ORAL_TABLET | Freq: Once | ORAL | Status: AC
  Administered 2016-07-18: 60 mg via ORAL
  Filled 2016-07-18: qty 1

## 2016-07-18 NOTE — Discharge Instructions (Signed)
Stop taking Bactrim. Take doxycycline instead. Follow up with your doctor. Return to the ED if your develop difficulty breathing, difficulty swallowing, chest pain, shortness of breath or any other concerns.

## 2016-07-18 NOTE — ED Provider Notes (Signed)
[facility] DEPT Provider Note   CSN: [id number] Arrival date & time: 07/18/16  1802     History   Chief Complaint Chief Complaint  Patient presents with  . Allergic Reaction    HPI [name] is a 50 y.o. female.   Patient presents with sensation of sore throat, tingling in her mouth and tingling on her face and swelling in her throat. She states this onset about 4 hours she took her first Bactrim pill for cellulitis today. She was seen in the ED earlier today for left leg cellulitis and given Bactrim. She denies any fever or vomiting. She's never had Bactrim before. She denies any other new exposures or new foods. Denies any chest pain or shortness of breath. Denies any rash anywhere else. Has a full sensation in her throat and feels like her tongue is numb and there is numbness along her lower face. She did not take any antihistamines at home. Denies any cough or fever. She is not on any ACE inhibitors.   The history is provided by the patient.    Past Medical History:  Diagnosis Date  . Asthma 2010  . Eczema since childhoosd   flare since 2011  . Hypertension     Patient Active Problem List   Diagnosis Date Noted  . Severe persistent asthma 04/15/2016  . Severe persistent chronic asthma with acute exacerbation 08/29/2015  . Abnormal thyroid ultrasound 12/29/2014  . Hyperlipidemia LDL goal <100 12/22/2014  . Prediabetes 12/22/2014  . Baker's cyst 10/28/2011  . Menorrhagia with irregular cycle 05/22/2011  . PEPTIC ULCER DISEASE, HELICOBACTER PYLORI POSITIVE 11/12/2010  . Hypothyroidism 10/30/2010  . SCIATICA, LEFT 01/18/2010  . Eczema 12/31/2009  . Morbid obesity ([place]) 11/02/2008  . Essential hypertension 11/02/2008  . Allergic rhinitis 11/02/2008  . DEGENERATIVE JOINT DISEASE, LEFT KNEE 11/02/2008    Past Surgical History:  Procedure Laterality Date  . BACK SURGERY    . KNEE SURGERY    . TUBAL LIGATION      OB History    No data available        Home Medications    Prior to Admission medications   Medication Sig Start Date End Date Taking? Authorizing Provider  albuterol (PROVENTIL HFA;VENTOLIN HFA) 108 (90 Base) MCG/ACT inhaler Inhale 2 puffs into the lungs every 6 (six) hours as needed for wheezing or shortness of breath. 05/21/16   Fayrene Helper, MD  amLODipine (NORVASC) 10 MG tablet TAKE 1 TABLET (10 MG TOTAL) BY MOUTH DAILY. 06/08/16   Fayrene Helper, MD  clindamycin (CLEOCIN) 150 MG capsule Take 1 capsule (150 mg total) by mouth 3 (three) times daily. 07/17/16   Max T Hyatt, DPM  fluticasone (FLONASE) 50 MCG/ACT nasal spray 1-2 every 12hours 10/24/15   Tanda Rockers, MD  Fluticasone-Salmeterol (ADVAIR DISKUS) 250-50 MCG/DOSE AEPB Inhale 1 puff into the lungs 2 (two) times daily. Discontinue symbicort, not covered by insurance 05/12/16   Fayrene Helper, MD  hydrOXYzine (ATARAX/VISTARIL) 10 MG tablet TAKE 1 TO 2 TABLETS BY MOUTH AT BEDTIME AS NEEDED FOR ITCHING 04/18/16   Fayrene Helper, MD  ipratropium-albuterol (DUONEB) 0.5-2.5 (3) MG/3ML SOLN USE ONE VIAL VIA NEBULIZER EVERY 8 HOURS AS NEEDED, FOR SEVERE WHEEZING AS DIRECTED 03/09/15   Fayrene Helper, MD  levothyroxine (SYNTHROID, LEVOTHROID) 50 MCG tablet TAKE 1 TABLET BY MOUTH IN THE MORNING ON MONDAY-THURSDAY, AND 1 AND 1/2 TABLET ON [facility] 05/30/16   Fayrene Helper, MD  meloxicam (MOBIC) 15 MG  tablet Take 1 tablet (15 mg total) by mouth daily. 04/01/16   Max T Hyatt, DPM  montelukast (SINGULAIR) 10 MG tablet TAKE 1 TABLET (10 MG TOTAL) BY MOUTH DAILY. 06/08/16   Fayrene Helper, MD  mupirocin ointment (BACTROBAN) 2 % APPLY TO AFFECTED AREA TWICE A DAY 02/08/16   Historical Provider, MD  oxyCODONE-acetaminophen (PERCOCET) 10-325 MG tablet Take one to two tablets by mouth every six to eight hours as needed for pain. 07/17/16   Max T Hyatt, DPM  promethazine (PHENERGAN) 25 MG tablet Take 1 tablet (25 mg total) by mouth every 8 (eight) hours as needed for  nausea or vomiting. 07/17/16   Max T Hyatt, DPM  sulfamethoxazole-trimethoprim (BACTRIM DS,SEPTRA DS) 800-160 MG tablet Take 1 tablet by mouth 2 (two) times daily. 07/18/16 07/25/16  Virgel Manifold, MD  tacrolimus (PROTOPIC) 0.1 % ointment APPLY TO FACE ONCE OR TWICE DAILY AS DIRECTED 12/06/15   Fayrene Helper, MD  triamcinolone cream (KENALOG) 0.1 % APPLY TO AFFECTED AREA ON BODY (NOT FACE) TWICE DAILY FOR 1-2 WEEKS 06/23/16   Fayrene Helper, MD  Vitamin D, Ergocalciferol, (DRISDOL) 50000 units CAPS capsule Take 1 capsule (50,000 Units total) by mouth every 7 (seven) days. 10/10/15   Fayrene Helper, MD    Family History Family History  Problem Relation Age of Onset  . Asthma Paternal Aunt   . Asthma Paternal Uncle     Social History Social History  Substance Use Topics  . Smoking status: Never Smoker  . Smokeless tobacco: Never Used  . Alcohol use Yes     Comment: occassionally     Allergies   Penicillins   Review of Systems Review of Systems  Constitutional: Negative for activity change, appetite change and fever.  HENT: Positive for sore throat and trouble swallowing. Negative for congestion.   Eyes: Negative for visual disturbance.  Respiratory: Negative for cough, chest tightness and shortness of breath.   Cardiovascular: Negative for chest pain and leg swelling.  Gastrointestinal: Negative for abdominal pain, nausea and vomiting.  Genitourinary: Negative for dysuria, hematuria, vaginal bleeding and vaginal discharge.  Musculoskeletal: Negative for arthralgias and myalgias.  Skin: Negative for wound.  Neurological: Negative for dizziness, weakness, light-headedness and headaches.  A complete 10 system review of systems was obtained and all systems are negative except as noted in the HPI and PMH.     Physical Exam Updated Vital Signs BP 138/85 (BP Location: Left Arm)   Pulse 91   Temp 98.5 F (36.9 C) (Oral)   Resp 20   Ht 5\' 4"  (1.626 m)   Wt 190 lb  (86.2 kg)   LMP 07/11/2016   SpO2 96%   BMI 32.61 kg/m   Physical Exam  Constitutional: She is oriented to person, place, and time. She appears well-developed and well-nourished. No distress.  HENT:  Head: Normocephalic and atraumatic.  Mouth/Throat: Oropharynx is clear and moist. No oropharyngeal exudate.  Tongue and lips appear normal. There is no swelling of the posterior pharynx. Smile is symmetric. There is no facial asymmetry is no sensory deficits.  Eyes: Conjunctivae and EOM are normal. Pupils are equal, round, and reactive to light.  Neck: Normal range of motion. Neck supple.  No meningismus.  Cardiovascular: Normal rate, regular rhythm, normal heart sounds and intact distal pulses.   No murmur heard. Pulmonary/Chest: Effort normal and breath sounds normal. No respiratory distress. She has no wheezes.  Abdominal: Soft. There is no tenderness. There is no rebound and  no guarding.  Musculoskeletal: Normal range of motion. She exhibits no edema or tenderness.  Erythema and warmth to L leg consistent with cellulitis  Neurological: She is alert and oriented to person, place, and time. No cranial nerve deficit. She exhibits normal muscle tone. Coordination normal.   5/5 strength throughout. CN 2-12 intact.Equal grip strength.   Skin: Skin is warm.  Psychiatric: She has a normal mood and affect. Her behavior is normal.  Nursing note and vitals reviewed.    ED Treatments / Results  Labs (all labs ordered are listed, but only abnormal results are displayed) Labs Reviewed  CBC WITH DIFFERENTIAL/PLATELET - Abnormal; Notable for the following:       Result Value   Neutro Abs 7.9 (*)    All other components within normal limits  BASIC METABOLIC PANEL    EKG  EKG Interpretation None       Radiology No results found.  Procedures Procedures (including critical care time)  Medications Ordered in ED Medications  predniSONE (DELTASONE) tablet 60 mg (60 mg Oral Given  07/18/16 1839)  famotidine (PEPCID) tablet 20 mg (20 mg Oral Given 07/18/16 1839)  diphenhydrAMINE (BENADRYL) capsule 25 mg (25 mg Oral Given 07/18/16 1839)     Initial Impression / Assessment and Plan / ED Course  I have reviewed the triage vital signs and the nursing notes.  Pertinent labs & imaging results that were available during my care of the patient were reviewed by me and considered in my medical decision making (see chart for details).  Clinical Course   Patient with possible allergic reaction to Bactrim. She has paresthesias and pain of her mouth with a numb feeling. There is no tongue or lip swelling. There is no wheezing.  Electrolytes normal.   8:15 PM. Patient feels improved. No further numb feeling to her tongue or lips. No further numbness or tingling. She feels back to baseline. There is no tongue or lip swelling.  Discontinue Bactrim. We'll start doxycycline instead. Continue antihistamines at home. Avoid steroids given her active cellulitis. Return to the ED if worsening symptoms.    Final Clinical Impressions(s) / ED Diagnoses   Final diagnoses:  Allergic reaction, initial encounter    New Prescriptions New Prescriptions   No medications on file     Ezequiel Essex, MD 07/19/16 [phone]

## 2016-07-18 NOTE — ED Triage Notes (Signed)
PT stated she was seen earlier today in the ED for cellulitis and took her first Bactrim pill at 1300 today and c/o sorethroat, mouth tenderness and feels like her throat is swelling.

## 2016-07-18 NOTE — ED Notes (Signed)
Patient drink po fluids without any difficulties. 

## 2016-07-18 NOTE — ED Triage Notes (Signed)
PT c/o pain, redness, swelling and drainage to left lower leg x5 days. PT states she was going to have surgery on right ankle this am at 1100 and was told to get evaluated in the ED for cellulitis on opposite side and surgery would have to be postponed.

## 2016-07-22 NOTE — ED Provider Notes (Signed)
[facility] DEPT Provider Note   CSN: [id number] Arrival date & time: 07/18/16  1201     History   Chief Complaint Chief Complaint  Patient presents with  . Cellulitis    HPI [name] is a 50 y.o. female.  HPI   50yF with painful lesion to LLE. Onset about a week ago. Had a white head that "popped." Some drainage. Has had increasing pain, swelling and redness since then. No fever or chills.   Past Medical History:  Diagnosis Date  . Asthma 2010  . Eczema since childhoosd   flare since 2011  . Hypertension     Patient Active Problem List   Diagnosis Date Noted  . Severe persistent asthma 04/15/2016  . Severe persistent chronic asthma with acute exacerbation 08/29/2015  . Abnormal thyroid ultrasound 12/29/2014  . Hyperlipidemia LDL goal <100 12/22/2014  . Prediabetes 12/22/2014  . Baker's cyst 10/28/2011  . Menorrhagia with irregular cycle 05/22/2011  . PEPTIC ULCER DISEASE, HELICOBACTER PYLORI POSITIVE 11/12/2010  . Hypothyroidism 10/30/2010  . SCIATICA, LEFT 01/18/2010  . Eczema 12/31/2009  . Morbid obesity ([place]) 11/02/2008  . Essential hypertension 11/02/2008  . Allergic rhinitis 11/02/2008  . DEGENERATIVE JOINT DISEASE, LEFT KNEE 11/02/2008    Past Surgical History:  Procedure Laterality Date  . BACK SURGERY    . KNEE SURGERY    . TUBAL LIGATION      OB History    No data available       Home Medications    Prior to Admission medications   Medication Sig Start Date End Date Taking? Authorizing Provider  albuterol (PROVENTIL HFA;VENTOLIN HFA) 108 (90 Base) MCG/ACT inhaler Inhale 2 puffs into the lungs every 6 (six) hours as needed for wheezing or shortness of breath. 05/21/16   Fayrene Helper, MD  amLODipine (NORVASC) 10 MG tablet TAKE 1 TABLET (10 MG TOTAL) BY MOUTH DAILY. 06/08/16   Fayrene Helper, MD  clindamycin (CLEOCIN) 150 MG capsule Take 1 capsule (150 mg total) by mouth 3 (three) times daily. Patient not taking: Reported on  07/18/2016 07/17/16   Max T Hyatt, DPM  diphenhydrAMINE (BENADRYL) 25 MG tablet Take 1 tablet (25 mg total) by mouth every 6 (six) hours. 07/18/16   Ezequiel Essex, MD  doxycycline (VIBRAMYCIN) 100 MG capsule Take 1 capsule (100 mg total) by mouth 2 (two) times daily. 07/18/16   Ezequiel Essex, MD  famotidine (PEPCID) 20 MG tablet Take 1 tablet (20 mg total) by mouth daily. 07/18/16   Ezequiel Essex, MD  fluticasone (FLONASE) 50 MCG/ACT nasal spray 1-2 every 12hours Patient taking differently: Place 1 spray into both nostrils daily as needed for allergies.  10/24/15   Tanda Rockers, MD  Fluticasone-Salmeterol (ADVAIR DISKUS) 250-50 MCG/DOSE AEPB Inhale 1 puff into the lungs 2 (two) times daily. Discontinue symbicort, not covered by insurance Patient not taking: Reported on 07/18/2016 05/12/16   Fayrene Helper, MD  hydrOXYzine (ATARAX/VISTARIL) 10 MG tablet TAKE 1 TO 2 TABLETS BY MOUTH AT BEDTIME AS NEEDED FOR ITCHING 04/18/16   Fayrene Helper, MD  ipratropium-albuterol (DUONEB) 0.5-2.5 (3) MG/3ML SOLN USE ONE VIAL VIA NEBULIZER EVERY 8 HOURS AS NEEDED, FOR SEVERE WHEEZING AS DIRECTED 03/09/15   Fayrene Helper, MD  levothyroxine (SYNTHROID, LEVOTHROID) 50 MCG tablet TAKE 1 TABLET BY MOUTH IN THE MORNING ON MONDAY-THURSDAY, AND 1 AND 1/2 TABLET ON [facility] 05/30/16   Fayrene Helper, MD  montelukast (SINGULAIR) 10 MG tablet TAKE 1 TABLET (10 MG TOTAL) BY  MOUTH DAILY. 06/08/16   Fayrene Helper, MD  oxyCODONE-acetaminophen (PERCOCET) 10-325 MG tablet Take one to two tablets by mouth every six to eight hours as needed for pain. Patient not taking: Reported on 07/18/2016 07/17/16   Max T Hyatt, DPM  promethazine (PHENERGAN) 25 MG tablet Take 1 tablet (25 mg total) by mouth every 8 (eight) hours as needed for nausea or vomiting. 07/17/16   Max T Hyatt, DPM  SYMBICORT 160-4.5 MCG/ACT inhaler Inhale 2 puffs into the lungs 2 (two) times daily. 05/27/16   Historical Provider, MD  tacrolimus  (PROTOPIC) 0.1 % ointment APPLY TO FACE ONCE OR TWICE DAILY AS DIRECTED 12/06/15   Fayrene Helper, MD  triamcinolone cream (KENALOG) 0.1 % APPLY TO AFFECTED AREA ON BODY (NOT FACE) TWICE DAILY FOR 1-2 WEEKS 06/23/16   Fayrene Helper, MD    Family History Family History  Problem Relation Age of Onset  . Asthma Paternal Aunt   . Asthma Paternal Uncle     Social History Social History  Substance Use Topics  . Smoking status: Never Smoker  . Smokeless tobacco: Never Used  . Alcohol use Yes     Comment: occassionally     Allergies   Bactrim [sulfamethoxazole-trimethoprim]; Penicillins; and Latex   Review of Systems Review of Systems  All systems reviewed and negative, other than as noted in HPI.  Physical Exam Updated Vital Signs BP 130/100 (BP Location: Left Arm) Comment: pt says she hasn't taken her bp pill today.   Pulse 85   Temp 98.3 F (36.8 C) (Oral)   Resp 20   Ht 5\' 4"  (1.626 m)   Wt 190 lb (86.2 kg)   LMP 07/11/2016   SpO2 96%   BMI 32.61 kg/m   Physical Exam  Constitutional: She appears well-developed and well-nourished. No distress.  HENT:  Head: Normocephalic and atraumatic.  Eyes: Conjunctivae are normal. Right eye exhibits no discharge. Left eye exhibits no discharge.  Neck: Neck supple.  Cardiovascular: Normal rate, regular rhythm and normal heart sounds.  Exam reveals no gallop and no friction rub.   No murmur heard. Pulmonary/Chest: Effort normal and breath sounds normal. No respiratory distress.  Abdominal: Soft. She exhibits no distension. There is no tenderness.  Musculoskeletal: She exhibits no edema or tenderness.       Legs: Area of cellulitis in pictured area with superficial ulceration near middle. NO drainage. Bedside US w/o collection.   Neurological: She is alert.  Skin: Skin is warm and dry.  Psychiatric: She has a normal mood and affect. Her behavior is normal. Thought content normal.  Nursing note and vitals  reviewed.    ED Treatments / Results  Labs (all labs ordered are listed, but only abnormal results are displayed) Labs Reviewed - No data to display  EKG  EKG Interpretation None       Radiology No results found.  Procedures Procedures (including critical care time)  Medications Ordered in ED Medications - No data to display   Initial Impression / Assessment and Plan / ED Course  I have reviewed the triage vital signs and the nursing notes.  Pertinent labs & imaging results that were available during my care of the patient were reviewed by me and considered in my medical decision making (see chart for details).  Clinical Course    50yF with cellulitis of LLE w/o abscess. I feel appropriate for outpt tx. It has been determined that no acute conditions requiring further emergency intervention are present  at this time. The patient has been advised of the diagnosis and plan. I reviewed any labs and imaging including any potential incidental findings. We have discussed signs and symptoms that warrant return to the ED and they are listed in the discharge instructions.    Final Clinical Impressions(s) / ED Diagnoses   Final diagnoses:  Cellulitis of left lower extremity    New Prescriptions Discharge Medication List as of 07/18/2016 12:18 PM    START taking these medications   Details  sulfamethoxazole-trimethoprim (BACTRIM DS,SEPTRA DS) 800-160 MG tablet Take 1 tablet by mouth 2 (two) times daily., Starting Fri 07/18/2016, Until Fri 07/25/2016, Print         Virgel Manifold, MD 07/22/16 0630

## 2016-07-24 ENCOUNTER — Telehealth: Payer: Self-pay | Admitting: *Deleted

## 2016-07-24 ENCOUNTER — Ambulatory Visit (INDEPENDENT_AMBULATORY_CARE_PROVIDER_SITE_OTHER): Payer: Managed Care, Other (non HMO) | Admitting: Podiatry

## 2016-07-24 VITALS — Temp 98.5°F

## 2016-07-24 DIAGNOSIS — M66871 Spontaneous rupture of other tendons, right ankle and foot: Secondary | ICD-10-CM | POA: Diagnosis not present

## 2016-07-24 NOTE — Telephone Encounter (Signed)
"  I was supposed to have surgery last week but I had cellulitis in my other leg.  So they put me on antibiotics for a week.  I want to know if I need to reschedule the surgery or what does he want you to do?  You probably haven't talked to him yet.  Give me a call and let me know."  Patient came in for an appointment on 07/24/16.  Surgery was rescheduled to 07/31/2016 at [facility].

## 2016-07-24 NOTE — Progress Notes (Signed)
She presents today for follow-up. She still has severe pain to her right ankle which we were going to take care of last week during surgery however she presented with an abscess to her left leg. She is being on antibiotics from her primary care provider for the past week or so and the abscess she states is subsiding.  Objective: Vital signs are stable alert and oriented 3. Pulses are palpable. Abscess appears to be resolving from the left leg. That the area still warm to the touch and she should continue her antibiotics. I see no purulence there is nothing to sample here there is no open wound or lesion.  Assessment: Right posterior tibial tendon tear abscess left leg.  Plan: Continue antibiotics soak in Epsom salts and warm water follow-up with me in 1 week for surgical intervention.

## 2016-07-25 ENCOUNTER — Telehealth: Payer: Self-pay | Admitting: *Deleted

## 2016-07-25 NOTE — Telephone Encounter (Signed)
"  I need to verify a surgery date of 07/18/2016.  Claim number is [id number]."  I called and spoke to [facility] and informed her patient's surgery was postponed.  New surgery date is 07/31/2016.  Patient had cellulitis in her leg, couldn't have surgery.

## 2016-07-31 ENCOUNTER — Encounter: Payer: Self-pay | Admitting: Podiatry

## 2016-07-31 DIAGNOSIS — M722 Plantar fascial fibromatosis: Secondary | ICD-10-CM | POA: Diagnosis not present

## 2016-07-31 DIAGNOSIS — M2041 Other hammer toe(s) (acquired), right foot: Secondary | ICD-10-CM | POA: Diagnosis not present

## 2016-08-01 ENCOUNTER — Telehealth: Payer: Self-pay | Admitting: *Deleted

## 2016-08-01 DIAGNOSIS — Z9889 Other specified postprocedural states: Secondary | ICD-10-CM

## 2016-08-01 NOTE — Telephone Encounter (Addendum)
Pt states her doctor said she could have a knee scooter. 08/05/2016-Dr. Hyatt's order for knee scooter sent to [facility].

## 2016-08-05 ENCOUNTER — Telehealth: Payer: Self-pay | Admitting: *Deleted

## 2016-08-05 MED ORDER — OXYCODONE-ACETAMINOPHEN 10-325 MG PO TABS: ORAL_TABLET | ORAL | 0 refills | Status: DC

## 2016-08-05 NOTE — Telephone Encounter (Signed)
That will be fine.  Just give her a prescription.

## 2016-08-05 NOTE — Telephone Encounter (Addendum)
Pt states she only has 4 more of the Oxycodone would like a refill. Dr. Marta Antu refill as previously.Left message informing pt the oxycodone could not be called to the pharmacy, but would have to hand carry the script to the pharmacy, and the rx could be picked up in the [place] office before 5:00pm. 09/19/2016-Julianne - [facility] states they have received [employer] statement for pt's disability claim, but still need to have pt's SS#, and policy # of job claim on the [employer]ending [employer] statement.

## 2016-08-06 ENCOUNTER — Other Ambulatory Visit: Payer: Self-pay

## 2016-08-06 MED ORDER — HYDROXYZINE HCL 10 MG PO TABS: ORAL_TABLET | ORAL | 1 refills | Status: DC

## 2016-08-07 ENCOUNTER — Encounter: Payer: Self-pay | Admitting: Podiatry

## 2016-08-07 ENCOUNTER — Ambulatory Visit (INDEPENDENT_AMBULATORY_CARE_PROVIDER_SITE_OTHER): Payer: Managed Care, Other (non HMO) | Admitting: Podiatry

## 2016-08-07 VITALS — BP 135/76 | HR 66 | Temp 99.1°F

## 2016-08-07 DIAGNOSIS — M722 Plantar fascial fibromatosis: Secondary | ICD-10-CM

## 2016-08-07 DIAGNOSIS — M66871 Spontaneous rupture of other tendons, right ankle and foot: Secondary | ICD-10-CM

## 2016-08-07 DIAGNOSIS — Z9889 Other specified postprocedural states: Secondary | ICD-10-CM

## 2016-08-07 MED ORDER — MUPIROCIN 2 % EX OINT: TOPICAL_OINTMENT | CUTANEOUS | 2 refills | Status: DC

## 2016-08-07 MED ORDER — OXYCODONE-ACETAMINOPHEN 10-325 MG PO TABS: ORAL_TABLET | ORAL | 0 refills | Status: DC

## 2016-08-09 NOTE — Progress Notes (Signed)
She presents today 7 days status post repair of posterior tibial tendon with cast and EPF right foot. She denies fever chills nausea vomiting muscle aches and pains denies shortness of breath or chest pain. She states that she has good sensation to her toes. Very little in way of pain.  Objective: Vital signs are stable alert and oriented 3. Pulses are palpable posterior knee right. Radiographs were not taken today. She has good sensation to her toes. And she can wiggle her toes.  Assessment: Well-healing surgical foot is no signs of complications.  Plan: I will follow-up with her in 1 week for cast removal and another cast application. X-rays will be performed at that time.

## 2016-08-12 ENCOUNTER — Ambulatory Visit (INDEPENDENT_AMBULATORY_CARE_PROVIDER_SITE_OTHER): Payer: Managed Care, Other (non HMO) | Admitting: Podiatry

## 2016-08-12 ENCOUNTER — Encounter: Payer: Managed Care, Other (non HMO) | Admitting: Podiatry

## 2016-08-12 ENCOUNTER — Ambulatory Visit (INDEPENDENT_AMBULATORY_CARE_PROVIDER_SITE_OTHER): Payer: Managed Care, Other (non HMO)

## 2016-08-12 ENCOUNTER — Encounter: Payer: Self-pay | Admitting: Podiatry

## 2016-08-12 VITALS — BP 118/84 | HR 79 | Temp 97.2°F | Resp 16

## 2016-08-12 DIAGNOSIS — Z9889 Other specified postprocedural states: Secondary | ICD-10-CM | POA: Diagnosis not present

## 2016-08-12 DIAGNOSIS — M722 Plantar fascial fibromatosis: Secondary | ICD-10-CM | POA: Diagnosis not present

## 2016-08-12 DIAGNOSIS — M66871 Spontaneous rupture of other tendons, right ankle and foot: Secondary | ICD-10-CM

## 2016-08-13 NOTE — Progress Notes (Signed)
She presents today for her second postop visit regarding Kidner procedure right foot. Presents ambulating with cast and crutches nonweightbearing. Denies fever chills nausea vomiting muscle aches and pains. Denies calf pain denies shortness of breath and chest pain.  Objective: Vital signs are stable alert and oriented 3 Intact Once Removed Demonstrates Dry Sterile Compressive Dressing Intact No Bleeding Sutures Are Intact Mild Edema No Erythema No Cellulitis Drainage or Odor. She Has Good Range Of Motion of the Foot Dorsiflexion Plantar Flexion Inversion and Eversion with No Pain. States Her Pain Level Is Approximately a 4 Out Of 10. She Has Good Range Of Motion of Her Toes with Normal Sensation. No Open Lesions or Wounds. Assessment: Well-Healing Surgical Foot Status Post 2 weeks Kidner procedure and posterior tibial tendon repair with repair of spring ligament. Status post EPF right foot.  Plan: Remove the sutures today from the EPF sites. Leave the staples in. Redressed with a dry sterile compressive dressing followed by a fiberglass cast. Continue nonweightbearing status. Follow-up with her in 2 weeks

## 2016-08-26 ENCOUNTER — Ambulatory Visit (INDEPENDENT_AMBULATORY_CARE_PROVIDER_SITE_OTHER): Payer: Managed Care, Other (non HMO) | Admitting: Podiatry

## 2016-08-26 DIAGNOSIS — Z9889 Other specified postprocedural states: Secondary | ICD-10-CM

## 2016-08-27 NOTE — Progress Notes (Signed)
She presents today for follow-up of her tibialis posterior tendon tear. With repair of her posterior tibial tendon and spring ligament. She denies fever chills nausea vomiting muscle aches and pains denies chest pain shortness of breath.  Objective: Cast is intact to the right lower extremity is clean and dry. He was removed demonstrates sutures are intact margins well coapted staples are intact no signs of infection.  Assessment: Well-healing surgical foot right. We removed every other staple today.  Plan every other staple was removed today redressed the foot and dressed a compressive dressing and below-knee cast right and I will follow-up with her in 2 weeks at which time the cast will be removed placed in a Cam Walker she will still retaining nonweightbearing status even at that point. Because of the spring ligament

## 2016-09-09 ENCOUNTER — Encounter: Payer: Self-pay | Admitting: Podiatry

## 2016-09-09 ENCOUNTER — Ambulatory Visit: Payer: Managed Care, Other (non HMO) | Admitting: Podiatry

## 2016-09-09 DIAGNOSIS — M66871 Spontaneous rupture of other tendons, right ankle and foot: Secondary | ICD-10-CM

## 2016-09-09 DIAGNOSIS — M722 Plantar fascial fibromatosis: Secondary | ICD-10-CM

## 2016-09-09 DIAGNOSIS — Z9889 Other specified postprocedural states: Secondary | ICD-10-CM

## 2016-09-09 MED ORDER — OXYCODONE-ACETAMINOPHEN 10-325 MG PO TABS: ORAL_TABLET | ORAL | 0 refills | Status: DC

## 2016-09-10 ENCOUNTER — Encounter: Payer: Self-pay | Admitting: Family Medicine

## 2016-09-10 ENCOUNTER — Ambulatory Visit: Payer: Self-pay | Admitting: Family Medicine

## 2016-09-10 NOTE — Progress Notes (Signed)
She presents today for a follow-up of her Kidner posterior tibial tendon repair and EPF right foot. She denies fever chills nausea vomiting muscle aches pains area she has no calf pain denies any shortness of breath or chest pain. She presents today nonweightbearing status with a cast to the right lower extremity.  Objective: Cast intact to the right lower extremity today once removed reveals pulses are intact neurologic sensorium is intact muscle strength is normal bilateral no dropfoot. No calf pain on palpation. Incision site medial ankle and foot appear to be healing very nicely minimal edema no cellulitis drainage or odor. Dry skin is present but no open lesions.  Assessment: Well-healing surgical foot and ankle right.  Plan: Place a compression anklet today and put her in a Cam Walker. She is to start partial weightbearing which is demonstrated to her today and I will follow-up with her in 2 weeks at which time we will progress to full weightbearing and a brace 2 weeks after that.

## 2016-09-19 ENCOUNTER — Other Ambulatory Visit: Payer: Self-pay | Admitting: Family Medicine

## 2016-09-26 NOTE — Progress Notes (Signed)
DOS 10.19.2017 Kidner Procedure Right Foot with Bone Anchor and Repair Post Tibial Tendon Right Ankle/Foot, Endoscopic Plantar Fasciotomy (EPF) Right Foot, Application Cast

## 2016-09-29 ENCOUNTER — Other Ambulatory Visit: Payer: Self-pay | Admitting: Family Medicine

## 2016-09-30 ENCOUNTER — Encounter: Payer: Self-pay | Admitting: Podiatry

## 2016-09-30 ENCOUNTER — Ambulatory Visit: Payer: Managed Care, Other (non HMO)

## 2016-09-30 ENCOUNTER — Ambulatory Visit (INDEPENDENT_AMBULATORY_CARE_PROVIDER_SITE_OTHER): Payer: Managed Care, Other (non HMO) | Admitting: Podiatry

## 2016-09-30 DIAGNOSIS — Z9889 Other specified postprocedural states: Secondary | ICD-10-CM

## 2016-09-30 DIAGNOSIS — M66871 Spontaneous rupture of other tendons, right ankle and foot: Secondary | ICD-10-CM | POA: Diagnosis not present

## 2016-09-30 DIAGNOSIS — M722 Plantar fascial fibromatosis: Secondary | ICD-10-CM

## 2016-10-01 NOTE — Progress Notes (Signed)
She presents today for follow-up of a posterior tibial tendon repair 2 months now date of surgery 07/31/2016. She also had an EPF done at that time. She states it is doing very well. She continues to utilize the cam walker and presents today with no crutches.  Objective: Vital signs are stable she's alert and oriented 3 she has great range of motion and good strength against resistance on inversion. She has minimal tenderness on palpation of the surgical site and the posterior tibial tendon as a whole. Radiographs taken today do demonstrate a Kidner procedure with an intact anchor right foot.  Assessment: Well-healing surgical foot right.  Plan: At this point I will place her in a Tri-Lock brace and encouraged ambulation with shoe gear. She understands that if she is not in a Tri-Lock brace and shoe she is to be in the [employer]. I will follow-up with her in a couple of weeks to reassess strength and ability to get through the day without pain. May need to consider physical therapy at that time if necessary.

## 2016-10-07 ENCOUNTER — Other Ambulatory Visit: Payer: Self-pay | Admitting: Family Medicine

## 2016-10-09 ENCOUNTER — Encounter: Payer: Self-pay | Admitting: Podiatry

## 2016-10-14 ENCOUNTER — Ambulatory Visit: Payer: Managed Care, Other (non HMO)

## 2016-10-14 ENCOUNTER — Ambulatory Visit (INDEPENDENT_AMBULATORY_CARE_PROVIDER_SITE_OTHER): Payer: Managed Care, Other (non HMO) | Admitting: Podiatry

## 2016-10-14 DIAGNOSIS — Z9889 Other specified postprocedural states: Secondary | ICD-10-CM

## 2016-10-14 DIAGNOSIS — M722 Plantar fascial fibromatosis: Secondary | ICD-10-CM

## 2016-10-14 DIAGNOSIS — M66871 Spontaneous rupture of other tendons, right ankle and foot: Secondary | ICD-10-CM

## 2016-10-15 NOTE — Progress Notes (Signed)
She presents today for follow-up of her posterior tibial tendon repair and endoscopic plantar fasciotomy right. She states that currently she really feels no pain at all.  Objective: Vital signs are stable she is alert and oriented 3 she has great range of motion and no pain on palpation of the surgical sites. She is still weak along the plantar medial aspect of the right foot with the inversion of the posterior tibial tendon ring the weakness.  Assessment: Status post Kidner procedure with repair of history tibial tendon and EPF right.  Plan: She was scanned today for set of orthotics and I gave her specific exercises to strengthen her posterior tibial tendon. Follow-up with her at the end of the month at which time we plan to go back to work.

## 2016-11-04 ENCOUNTER — Ambulatory Visit (INDEPENDENT_AMBULATORY_CARE_PROVIDER_SITE_OTHER): Payer: Self-pay | Admitting: Podiatry

## 2016-11-04 ENCOUNTER — Encounter: Payer: Self-pay | Admitting: Podiatry

## 2016-11-04 DIAGNOSIS — M722 Plantar fascial fibromatosis: Secondary | ICD-10-CM

## 2016-11-04 DIAGNOSIS — M66871 Spontaneous rupture of other tendons, right ankle and foot: Secondary | ICD-10-CM

## 2016-11-04 DIAGNOSIS — Z9889 Other specified postprocedural states: Secondary | ICD-10-CM

## 2016-11-04 NOTE — Patient Instructions (Signed)

## 2016-11-05 NOTE — Progress Notes (Signed)
She presents today to pick up her orthotics she states that her feet are doing very well she is status post EPF and repair of posterior tibial tendon with Kidner advancement.  Objective: No change in physical exam.  Assessment: Well healing surgical foot.  Pick up her orthotics today she was provided with both oral and written home going instructions will follow up with her in 1 month.

## 2016-11-20 ENCOUNTER — Ambulatory Visit: Payer: Managed Care, Other (non HMO) | Admitting: Podiatry

## 2016-12-04 ENCOUNTER — Ambulatory Visit: Payer: Managed Care, Other (non HMO) | Admitting: Podiatry

## 2016-12-09 ENCOUNTER — Other Ambulatory Visit: Payer: Self-pay | Admitting: Family Medicine

## 2016-12-09 ENCOUNTER — Emergency Department (HOSPITAL_COMMUNITY): Payer: Managed Care, Other (non HMO)

## 2016-12-09 ENCOUNTER — Emergency Department (HOSPITAL_COMMUNITY)
Admission: EM | Admit: 2016-12-09 | Discharge: 2016-12-09 | Disposition: A | Discharge status: Home / Self Care | Payer: Managed Care, Other (non HMO) | Attending: Emergency Medicine | Admitting: Emergency Medicine

## 2016-12-09 ENCOUNTER — Encounter (HOSPITAL_COMMUNITY): Payer: Self-pay | Admitting: *Deleted

## 2016-12-09 DIAGNOSIS — Z79899 Other long term (current) drug therapy: Secondary | ICD-10-CM | POA: Insufficient documentation

## 2016-12-09 DIAGNOSIS — I10 Essential (primary) hypertension: Secondary | ICD-10-CM | POA: Diagnosis not present

## 2016-12-09 DIAGNOSIS — Z91040 Latex allergy status: Secondary | ICD-10-CM | POA: Diagnosis not present

## 2016-12-09 DIAGNOSIS — J45901 Unspecified asthma with (acute) exacerbation: Secondary | ICD-10-CM | POA: Diagnosis not present

## 2016-12-09 DIAGNOSIS — R05 Cough: Secondary | ICD-10-CM | POA: Diagnosis present

## 2016-12-09 DIAGNOSIS — E039 Hypothyroidism, unspecified: Secondary | ICD-10-CM | POA: Insufficient documentation

## 2016-12-09 DIAGNOSIS — L309 Dermatitis, unspecified: Secondary | ICD-10-CM | POA: Diagnosis not present

## 2016-12-09 LAB — CBC WITH DIFFERENTIAL/PLATELET
Basophils Absolute: 0 K/uL (ref 0.0–0.1)
Basophils Relative: 0 %
Eosinophils Absolute: 0.9 K/uL — ABNORMAL HIGH (ref 0.0–0.7)
Eosinophils Relative: 7 %
HCT: 41.9 % (ref 36.0–46.0)
Hemoglobin: 13.7 g/dL (ref 12.0–15.0)
Lymphocytes Relative: 8 %
Lymphs Abs: 1.1 K/uL (ref 0.7–4.0)
MCH: 27.7 pg (ref 26.0–34.0)
MCHC: 32.7 g/dL (ref 30.0–36.0)
MCV: 84.8 fL (ref 78.0–100.0)
Monocytes Absolute: 0.8 K/uL (ref 0.1–1.0)
Monocytes Relative: 6 %
Neutro Abs: 10.2 K/uL — ABNORMAL HIGH (ref 1.7–7.7)
Neutrophils Relative %: 79 %
Platelets: 385 K/uL (ref 150–400)
RBC: 4.94 MIL/uL (ref 3.87–5.11)
RDW: 14.5 % (ref 11.5–15.5)
WBC: 12.9 K/uL — ABNORMAL HIGH (ref 4.0–10.5)

## 2016-12-09 LAB — BASIC METABOLIC PANEL
ANION GAP: 9 (ref 5–15)
BUN: 13 mg/dL (ref 6–20)
CO2: 27 mmol/L (ref 22–32)
Calcium: 9.3 mg/dL (ref 8.9–10.3)
Chloride: 100 mmol/L — ABNORMAL LOW (ref 101–111)
Creatinine, Ser: 0.77 mg/dL (ref 0.44–1.00)
GLUCOSE: 123 mg/dL — AB (ref 65–99)
POTASSIUM: 3.7 mmol/L (ref 3.5–5.1)
Sodium: 136 mmol/L (ref 135–145)

## 2016-12-09 MED ORDER — ALBUTEROL SULFATE (2.5 MG/3ML) 0.083% IN NEBU: 2.5000 mg | INHALATION_SOLUTION | RESPIRATORY_TRACT | 0 refills | Status: DC | PRN

## 2016-12-09 MED ORDER — IPRATROPIUM-ALBUTEROL 0.5-2.5 (3) MG/3ML IN SOLN: 3.0000 mL | Freq: Once | RESPIRATORY_TRACT | Status: DC

## 2016-12-09 MED ORDER — PREDNISONE 20 MG PO TABS: 40.0000 mg | ORAL_TABLET | Freq: Every day | ORAL | 0 refills | Status: DC

## 2016-12-09 MED ORDER — IPRATROPIUM BROMIDE 0.02 % IN SOLN
1.0000 mg | Freq: Once | RESPIRATORY_TRACT | Status: AC
  Administered 2016-12-09: 1 mg via RESPIRATORY_TRACT
  Filled 2016-12-09: qty 5

## 2016-12-09 MED ORDER — ALBUTEROL SULFATE HFA 108 (90 BASE) MCG/ACT IN AERS: 4.0000 | INHALATION_SPRAY | RESPIRATORY_TRACT | Status: DC

## 2016-12-09 MED ORDER — ALBUTEROL (5 MG/ML) CONTINUOUS INHALATION SOLN
10.0000 mg/h | INHALATION_SOLUTION | Freq: Once | RESPIRATORY_TRACT | Status: AC
Start: 2016-12-09 — End: 2016-12-09
  Administered 2016-12-09: 10 mg/h via RESPIRATORY_TRACT
  Filled 2016-12-09: qty 20

## 2016-12-09 MED ORDER — METHYLPREDNISOLONE SODIUM SUCC 125 MG IJ SOLR
125.0000 mg | Freq: Once | INTRAMUSCULAR | Status: AC
  Administered 2016-12-09: 125 mg via INTRAVENOUS
  Filled 2016-12-09: qty 2

## 2016-12-09 MED ORDER — FAMOTIDINE IN NACL 20-0.9 MG/50ML-% IV SOLN
20.0000 mg | Freq: Once | INTRAVENOUS | Status: AC
  Administered 2016-12-09: 20 mg via INTRAVENOUS
  Filled 2016-12-09: qty 50

## 2016-12-09 NOTE — ED Notes (Signed)
In addition to shortness of breath, pt a rash over her abdomen and arms.

## 2016-12-09 NOTE — ED Triage Notes (Signed)
Pt comes in for asthma. Pt states she started having trouble breathing 3 days ago. She has tried to use her inhaler but no relief. Oxygen 90% in triage.

## 2016-12-09 NOTE — ED Notes (Signed)
Pt ambulated in hall way with NT. O2 dropped to 92% on room air.

## 2016-12-09 NOTE — ED Notes (Signed)
Ambulated Pt pt spo2 was 92

## 2016-12-09 NOTE — ED Provider Notes (Signed)
[facility] DEPT Provider Note   CSN: [id number] Arrival date & time: 12/09/16  1142     History   Chief Complaint Chief Complaint  Patient presents with  . Asthma    HPI [name] is a 51 y.o. female.  HPI  Pt was seen at 1155.  Per pt, c/o gradual onset and worsening of persistent cough, wheezing and SOB for the past 3 days.  Describes her symptoms as "my asthma is acting up."  States she ran out of her home MDI and neb solution. Pt also reports acute flair of her chronic "eczema rash" for the past 3 days. Pt describes the rash as "itching."  Denies CP/palpitations, no back pain, no abd pain, no N/V/D, no fevers.    Past Medical History:  Diagnosis Date  . Asthma 2010  . Eczema since childhoosd   flare since 2011  . Hypertension     Patient Active Problem List   Diagnosis Date Noted  . Severe persistent asthma 04/15/2016  . Severe persistent chronic asthma with acute exacerbation 08/29/2015  . Abnormal thyroid ultrasound 12/29/2014  . Hyperlipidemia LDL goal <100 12/22/2014  . Prediabetes 12/22/2014  . Baker's cyst 10/28/2011  . Menorrhagia with irregular cycle 05/22/2011  . PEPTIC ULCER DISEASE, HELICOBACTER PYLORI POSITIVE 11/12/2010  . Hypothyroidism 10/30/2010  . SCIATICA, LEFT 01/18/2010  . Eczema 12/31/2009  . Morbid obesity ([place]) 11/02/2008  . Essential hypertension 11/02/2008  . Allergic rhinitis 11/02/2008  . DEGENERATIVE JOINT DISEASE, LEFT KNEE 11/02/2008    Past Surgical History:  Procedure Laterality Date  . ANKLE SURGERY    . BACK SURGERY    . KNEE SURGERY    . TUBAL LIGATION      OB History    No data available       Home Medications    Prior to Admission medications   Medication Sig Start Date End Date Taking? Authorizing Provider  albuterol (PROVENTIL HFA;VENTOLIN HFA) 108 (90 Base) MCG/ACT inhaler Inhale 2 puffs into the lungs every 6 (six) hours as needed for wheezing or shortness of breath. 05/21/16  Yes Fayrene Helper, MD  amLODipine (NORVASC) 10 MG tablet TAKE 1 TABLET (10 MG TOTAL) BY MOUTH DAILY. 10/07/16  Yes Fayrene Helper, MD  diphenhydrAMINE (BENADRYL) 25 MG tablet Take 1 tablet (25 mg total) by mouth every 6 (six) hours. 07/18/16  Yes Ezequiel Essex, MD  famotidine (PEPCID) 20 MG tablet Take 1 tablet (20 mg total) by mouth daily. 07/18/16  Yes Ezequiel Essex, MD  fluticasone (FLONASE) 50 MCG/ACT nasal spray 1-2 every 12hours Patient taking differently: Place 1 spray into both nostrils daily as needed for allergies.  10/24/15  Yes Tanda Rockers, MD  hydrOXYzine (ATARAX/VISTARIL) 10 MG tablet TAKE 1 TO 2 TABLETS BY MOUTH AT BEDTIME AS NEEDED FOR ITCHING 08/06/16  Yes Fayrene Helper, MD  ipratropium-albuterol (DUONEB) 0.5-2.5 (3) MG/3ML SOLN USE ONE VIAL VIA NEBULIZER EVERY 8 HOURS AS NEEDED, FOR SEVERE WHEEZING AS DIRECTED 03/09/15  Yes Fayrene Helper, MD  levothyroxine (SYNTHROID, LEVOTHROID) 50 MCG tablet TAKE 1 TABLET BY MOUTH IN THE MORNING ON MONDAY-THURSDAY, AND 1 AND 1/2 TABLET ON [facility] 09/19/16  Yes Fayrene Helper, MD  montelukast (SINGULAIR) 10 MG tablet TAKE 1 TABLET (10 MG TOTAL) BY MOUTH DAILY. 09/29/16  Yes Fayrene Helper, MD  mupirocin ointment (BACTROBAN) 2 % Apply to wound twice a day. 08/07/16  Yes Max T Hyatt, DPM  SYMBICORT 160-4.5 MCG/ACT inhaler Inhale 2 puffs into  the lungs 2 (two) times daily. 05/27/16  Yes Historical Provider, MD  tacrolimus (PROTOPIC) 0.1 % ointment APPLY TO FACE ONCE OR TWICE DAILY AS DIRECTED 12/06/15  Yes Fayrene Helper, MD  triamcinolone cream (KENALOG) 0.1 % APPLY TO AFFECTED AREA ON BODY (NOT FACE) TWICE DAILY FOR 1-2 WEEKS 06/23/16  Yes Fayrene Helper, MD  Fluticasone-Salmeterol (ADVAIR DISKUS) 250-50 MCG/DOSE AEPB Inhale 1 puff into the lungs 2 (two) times daily. Discontinue symbicort, not covered by insurance Patient not taking: Reported on 12/09/2016 05/12/16   Fayrene Helper, MD  oxyCODONE-acetaminophen (PERCOCET)  10-325 MG tablet Take one to two tablets by mouth every six to eight hours as needed for pain. Patient not taking: Reported on 12/09/2016 09/09/16   Max Villa Herb, DPM    Family History Family History  Problem Relation Age of Onset  . Asthma Paternal Aunt   . Asthma Paternal Uncle     Social History Social History  Substance Use Topics  . Smoking status: Never Smoker  . Smokeless tobacco: Never Used  . Alcohol use Yes     Comment: occassionally     Allergies   Bactrim [sulfamethoxazole-trimethoprim]; Penicillins; and Latex   Review of Systems Review of Systems ROS: Statement: All systems negative except as marked or noted in the HPI; Constitutional: Negative for fever and chills. ; ; Eyes: Negative for eye pain, redness and discharge. ; ; ENMT: Negative for ear pain, hoarseness, nasal congestion, sinus pressure and sore throat. ; ; Cardiovascular: Negative for chest pain, palpitations, diaphoresis, and peripheral edema. ; ; Respiratory: +cough, wheezing, SOB. Negative for stridor. ; ; Gastrointestinal: Negative for nausea, vomiting, diarrhea, abdominal pain, blood in stool, hematemesis, jaundice and rectal bleeding. . ; ; Genitourinary: Negative for dysuria, flank pain and hematuria. ; ; Musculoskeletal: Negative for back pain and neck pain. Negative for swelling and trauma.; ; Skin: +itching rash. Negative for abrasions, blisters, bruising and skin lesion.; ; Neuro: Negative for headache, lightheadedness and neck stiffness. Negative for weakness, altered level of consciousness, altered mental status, extremity weakness, paresthesias, involuntary movement, seizure and syncope.       Physical Exam Updated Vital Signs BP (!) 169/101 (BP Location: Right Arm)   Pulse 99   Temp 98.3 F (36.8 C) (Oral)   Resp 17   Ht 5\' 4"  (1.626 m)   Wt 190 lb (86.2 kg)   LMP 12/02/2016   SpO2 91%   BMI 32.61 kg/m       Physical Exam 1200: Physical examination:  Nursing notes reviewed;  Vital signs and O2 SAT reviewed;  Constitutional: Well developed, Well nourished, Well hydrated, Uncomfortable appearing.;; Head:  Normocephalic, atraumatic; Eyes: EOMI, PERRL, No scleral icterus; ENMT: TM's clear bilat. +edemetous nasal turbinates bilat with clear rhinorrhea. Mouth and pharynx without lesions. No tonsillar exudates. No intra-oral edema. No submandibular or sublingual edema. No hoarse voice, no drooling, no stridor. No trismus. Mouth and pharynx normal, Mucous membranes moist; Neck: Supple, Full range of motion, No lymphadenopathy; Cardiovascular: Tachycardic rate and rhythm, No gallop; Respiratory: Breath sounds coarse & equal bilaterally, insp/exp wheezes bilat. No audible wheezing.  Speaking short sentences, sitting upright, tachypneic.; Chest: Nontender, Movement normal; Abdomen: Soft, Nontender, Nondistended, Normal bowel sounds; Genitourinary: No CVA tenderness; Extremities: Pulses normal, No tenderness, No edema, No calf edema or asymmetry.; Neuro: AA&Ox3, Major CN grossly intact.  Speech clear. No gross focal motor or sensory deficits in extremities.; Skin: Color normal, Warm, Dry. +diffuse eczema rash to extremities, torso. No open wounds, no  drainage, no surrounding erythema, no streaking.   ED Treatments / Results  Labs (all labs ordered are listed, but only abnormal results are displayed)   EKG  EKG Interpretation None       Radiology   Procedures Procedures (including critical care time)  Medications Ordered in ED Medications  methylPREDNISolone sodium succinate (SOLU-MEDROL) 125 mg/2 mL injection 125 mg (125 mg Intravenous Given 12/09/16 1233)  albuterol (PROVENTIL,VENTOLIN) solution continuous neb (10 mg/hr Nebulization Given 12/09/16 1217)  ipratropium (ATROVENT) nebulizer solution 1 mg (1 mg Nebulization Given 12/09/16 1217)  famotidine (PEPCID) IVPB 20 mg premix (0 mg Intravenous Stopped 12/09/16 1337)     Initial Impression / Assessment and Plan / ED  Course  I have reviewed the triage vital signs and the nursing notes.  Pertinent labs & imaging results that were available during my care of the patient were reviewed by me and considered in my medical decision making (see chart for details).  MDM Reviewed: previous chart, nursing note and vitals Reviewed previous: labs Interpretation: labs and x-ray Total time providing critical care: 30-74 minutes. This excludes time spent performing separately reportable procedures and services.   CRITICAL CARE Performed by: Alfonzo Feller Total critical care time: 35 minutes Critical care time was exclusive of separately billable procedures and treating other patients. Critical care was necessary to treat or prevent imminent or life-threatening deterioration. Critical care was time spent personally by me on the following activities: development of treatment plan with patient and/or surrogate as well as nursing, discussions with consultants, evaluation of patient's response to treatment, examination of patient, obtaining history from patient or surrogate, ordering and performing treatments and interventions, ordering and review of laboratory studies, ordering and review of radiographic studies, pulse oximetry and re-evaluation of patient's condition.   Results for orders placed or performed during the hospital encounter of [id number]  Basic metabolic panel  Result Value Ref Range   Sodium 136 135 - 145 mmol/L   Potassium 3.7 3.5 - 5.1 mmol/L   Chloride 100 (L) 101 - 111 mmol/L   CO2 27 22 - 32 mmol/L   Glucose, Bld 123 (H) 65 - 99 mg/dL   BUN 13 6 - 20 mg/dL   Creatinine, Ser 0.77 0.44 - 1.00 mg/dL   Calcium 9.3 8.9 - 10.3 mg/dL   GFR calc non Af Amer >60 >60 mL/min   GFR calc Af Amer >60 >60 mL/min   Anion gap 9 5 - 15  CBC with Differential  Result Value Ref Range   WBC 12.9 (H) 4.0 - 10.5 K/uL   RBC 4.94 3.87 - 5.11 MIL/uL   Hemoglobin 13.7 12.0 - 15.0 g/dL   HCT 41.9 36.0 - 46.0 %    MCV 84.8 78.0 - 100.0 fL   MCH 27.7 26.0 - 34.0 pg   MCHC 32.7 30.0 - 36.0 g/dL   RDW 14.5 11.5 - 15.5 %   Platelets 385 150 - 400 K/uL   Neutrophils Relative % 79 %   Neutro Abs 10.2 (H) 1.7 - 7.7 K/uL   Lymphocytes Relative 8 %   Lymphs Abs 1.1 0.7 - 4.0 K/uL   Monocytes Relative 6 %   Monocytes Absolute 0.8 0.1 - 1.0 K/uL   Eosinophils Relative 7 %   Eosinophils Absolute 0.9 (H) 0.0 - 0.7 K/uL   Basophils Relative 0 %   Basophils Absolute 0.0 0.0 - 0.1 K/uL   Dg Chest Port 1 View Result Date: 12/09/2016 CLINICAL DATA:  Cough, wheezing for 3 days,  history of asthma EXAM: PORTABLE CHEST 1 VIEW COMPARISON:  Chest x-ray 01/11/2015 and 10/04/2014 FINDINGS: No active infiltrate or effusion is seen. Overlying breast tissue creates haziness at the lung bases. Mediastinal and hilar contours are unremarkable. The heart is within normal limits in size. No bony abnormality is seen. IMPRESSION: No active disease. Electronically Signed   By: Ivar Drape M.D.   On: 12/09/2016 12:21    1450:  On arrival: pt sitting upright, tachypneic, tachycardic, Sats 89 % R/A, lungs diminished with wheezing. IV solumedrol and hour long neb started. After neb: pt appears more comfortable at rest, less tachypneic, Sats 94-95 % R/A, lungs continue diminished with faint scattered wheezing. Pt ambulated in hallway: pt's O2 Sats dropped to 92 % R/A. Pt denied SOB, stating she "felt fine." Pt offered admission. Pt refuses, states she wants to go home. Pt makes her own medical decisions. States she has neb machine, requesting refills of albuterol neb solution and albuterol MDI. Return precautions given; pt verb understanding. Dx and testing d/w pt.  Questions answered.  Verb understanding, agreeable to d/c home with outpt f/u.       Final Clinical Impressions(s) / ED Diagnoses   Final diagnoses:  None    New Prescriptions New Prescriptions   No medications on file     Francine Graven, DO 12/14/16 1706

## 2016-12-09 NOTE — Discharge Instructions (Signed)
Take the prescriptions as directed.  Use your albuterol inhaler (2 to 4 puffs) or your albuterol nebulizer (1 unit dose) every 4 hours for the next 7 days, then as needed for cough, wheezing, or shortness of breath.  Call your regular medical doctor today to schedule a follow up appointment within the next 2 days.  Return to the Emergency Department immediately sooner if worsening.  ° °

## 2016-12-29 ENCOUNTER — Other Ambulatory Visit: Payer: Self-pay | Admitting: Family Medicine

## 2017-01-06 ENCOUNTER — Ambulatory Visit: Payer: Managed Care, Other (non HMO) | Admitting: Podiatry

## 2017-01-27 ENCOUNTER — Ambulatory Visit: Payer: Managed Care, Other (non HMO) | Admitting: Podiatry

## 2017-02-24 ENCOUNTER — Emergency Department (HOSPITAL_COMMUNITY)
Admission: EM | Admit: 2017-02-24 | Discharge: 2017-02-24 | Disposition: A | Discharge status: Home / Self Care | Payer: Managed Care, Other (non HMO) | Attending: Emergency Medicine | Admitting: Emergency Medicine

## 2017-02-24 ENCOUNTER — Encounter (HOSPITAL_COMMUNITY): Payer: Self-pay | Admitting: *Deleted

## 2017-02-24 DIAGNOSIS — J45909 Unspecified asthma, uncomplicated: Secondary | ICD-10-CM | POA: Diagnosis not present

## 2017-02-24 DIAGNOSIS — Z79899 Other long term (current) drug therapy: Secondary | ICD-10-CM | POA: Diagnosis not present

## 2017-02-24 DIAGNOSIS — I10 Essential (primary) hypertension: Secondary | ICD-10-CM | POA: Insufficient documentation

## 2017-02-24 DIAGNOSIS — L308 Other specified dermatitis: Secondary | ICD-10-CM

## 2017-02-24 DIAGNOSIS — R21 Rash and other nonspecific skin eruption: Secondary | ICD-10-CM | POA: Diagnosis present

## 2017-02-24 MED ORDER — PREDNISONE 10 MG PO TABS: 40.0000 mg | ORAL_TABLET | Freq: Every day | ORAL | 0 refills | Status: DC

## 2017-02-24 MED ORDER — PREDNISONE 50 MG PO TABS
60.0000 mg | ORAL_TABLET | Freq: Once | ORAL | Status: AC
  Administered 2017-02-24: 08:00:00 60 mg via ORAL
  Filled 2017-02-24: qty 1

## 2017-02-24 NOTE — Discharge Instructions (Signed)
Until you're able to talk to your dermatologist stop current medication. Start prednisone. Follow-up with them to get additional suggestions on how to treat the eczema. Return for any new or worse symptoms. Work note provided.

## 2017-02-24 NOTE — ED Provider Notes (Signed)
[facility] DEPT Provider Note   CSN: [id number] Arrival date & time: 02/24/17  9675     History   Chief Complaint Chief Complaint  Patient presents with  . Rash    HPI [name] is a 51 y.o. female.  Patient with long history of eczema followed by dermatology. Recently treated by dermatology for the eczema outbreak with doxycycline in and also using an anti-staph cream in the nose. Also did give prescription for her normal skin medication but was not able to get that filled yet is too early in the month. So patient tried to contact dermatology yesterday they missed each other in the call she missed her call back. Patient here today for worsening eczema she was concerned perhaps a resolving allergic reaction. No tongue swelling no trouble swallowing no wheezing. No hives.      Past Medical History:  Diagnosis Date  . Asthma 2010  . Eczema since childhoosd   flare since 2011  . Hypertension     Patient Active Problem List   Diagnosis Date Noted  . Severe persistent asthma 04/15/2016  . Severe persistent chronic asthma with acute exacerbation 08/29/2015  . Abnormal thyroid ultrasound 12/29/2014  . Hyperlipidemia LDL goal <100 12/22/2014  . Prediabetes 12/22/2014  . Baker's cyst 10/28/2011  . Menorrhagia with irregular cycle 05/22/2011  . PEPTIC ULCER DISEASE, HELICOBACTER PYLORI POSITIVE 11/12/2010  . Hypothyroidism 10/30/2010  . SCIATICA, LEFT 01/18/2010  . Eczema 12/31/2009  . Morbid obesity ([place]) 11/02/2008  . Essential hypertension 11/02/2008  . Allergic rhinitis 11/02/2008  . DEGENERATIVE JOINT DISEASE, LEFT KNEE 11/02/2008    Past Surgical History:  Procedure Laterality Date  . ANKLE SURGERY    . BACK SURGERY    . KNEE SURGERY    . TUBAL LIGATION      OB History    No data available       Home Medications    Prior to Admission medications   Medication Sig Start Date End Date Taking? Authorizing Provider  albuterol (PROVENTIL  HFA;VENTOLIN HFA) 108 (90 Base) MCG/ACT inhaler Inhale 2 puffs into the lungs every 6 (six) hours as needed for wheezing or shortness of breath. 05/21/16   Fayrene Helper, MD  albuterol (PROVENTIL) (2.5 MG/3ML) 0.083% nebulizer solution Take 3 mLs (2.5 mg total) by nebulization every 4 (four) hours as needed for wheezing or shortness of breath. 12/09/16   Francine Graven, DO  amLODipine (NORVASC) 10 MG tablet TAKE 1 TABLET (10 MG TOTAL) BY MOUTH DAILY. 10/07/16   Fayrene Helper, MD  diphenhydrAMINE (BENADRYL) 25 MG tablet Take 1 tablet (25 mg total) by mouth every 6 (six) hours. 07/18/16   Rancour, Annie Main, MD  famotidine (PEPCID) 20 MG tablet Take 1 tablet (20 mg total) by mouth daily. 07/18/16   Rancour, Annie Main, MD  fluticasone (FLONASE) 50 MCG/ACT nasal spray 1-2 every 12hours Patient taking differently: Place 1 spray into both nostrils daily as needed for allergies.  10/24/15   Tanda Rockers, MD  Fluticasone-Salmeterol (ADVAIR DISKUS) 250-50 MCG/DOSE AEPB Inhale 1 puff into the lungs 2 (two) times daily. Discontinue symbicort, not covered by insurance Patient not taking: Reported on 12/09/2016 05/12/16   Fayrene Helper, MD  hydrOXYzine (ATARAX/VISTARIL) 10 MG tablet TAKE 1 TO 2 TABLETS BY MOUTH AT BEDTIME AS NEEDED FOR ITCHING 08/06/16   Fayrene Helper, MD  ipratropium-albuterol (DUONEB) 0.5-2.5 (3) MG/3ML SOLN USE ONE VIAL VIA NEBULIZER EVERY 8 HOURS AS NEEDED, FOR SEVERE WHEEZING AS DIRECTED 03/09/15  Fayrene Helper, MD  levothyroxine (SYNTHROID, LEVOTHROID) 50 MCG tablet TAKE 1 TABLET BY MOUTH IN THE MORNING ON MONDAY-THURSDAY, AND 1 AND 1/2 TABLET ON [facility] 09/19/16   Fayrene Helper, MD  montelukast (SINGULAIR) 10 MG tablet TAKE 1 TABLET (10 MG TOTAL) BY MOUTH DAILY. 09/29/16   Fayrene Helper, MD  mupirocin ointment (BACTROBAN) 2 % Apply to wound twice a day. 08/07/16   Hyatt, Max T, DPM  oxyCODONE-acetaminophen (PERCOCET) 10-325 MG tablet Take one to two  tablets by mouth every six to eight hours as needed for pain. Patient not taking: Reported on 12/09/2016 09/09/16   Hyatt, Max T, DPM  predniSONE (DELTASONE) 10 MG tablet Take 4 tablets (40 mg total) by mouth daily. 02/24/17   Fredia Sorrow, MD  predniSONE (DELTASONE) 20 MG tablet Take 2 tablets (40 mg total) by mouth daily. 12/09/16   Francine Graven, DO  SYMBICORT 160-4.5 MCG/ACT inhaler Inhale 2 puffs into the lungs 2 (two) times daily. 05/27/16   [provider]  tacrolimus (PROTOPIC) 0.1 % ointment APPLY TO FACE ONCE OR TWICE DAILY AS DIRECTED 12/06/15   Fayrene Helper, MD  triamcinolone cream (KENALOG) 0.1 % APPLY TO AFFECTED AREA ON BODY (NOT FACE) TWICE DAILY FOR 1-2 WEEKS 12/30/16   Fayrene Helper, MD    Family History Family History  Problem Relation Age of Onset  . Asthma Paternal Aunt   . Asthma Paternal Uncle     Social History Social History  Substance Use Topics  . Smoking status: Never Smoker  . Smokeless tobacco: Never Used  . Alcohol use Yes     Comment: occassionally     Allergies   Bactrim [sulfamethoxazole-trimethoprim]; Penicillins; and Latex   Review of Systems Review of Systems  Constitutional: Negative for fever.  HENT: Negative for congestion.   Eyes: Negative for redness.  Respiratory: Negative for shortness of breath.   Cardiovascular: Negative for chest pain.  Gastrointestinal: Negative for abdominal pain.  Genitourinary: Negative for dysuria.  Musculoskeletal: Positive for myalgias. Negative for back pain.  Skin: Positive for rash.  Neurological: Negative for headaches.  Hematological: Does not bruise/bleed easily.  Psychiatric/Behavioral: Negative for confusion.     Physical Exam Updated Vital Signs BP (!) 134/97 (BP Location: Left Arm)   Pulse 83   Temp 98.5 F (36.9 C) (Oral)   Resp 18   Ht 5\' 3"  (1.6 m)   Wt 83.9 kg   LMP 02/17/2017   SpO2 95%   BMI 32.77 kg/m   Physical Exam  Constitutional: She is  oriented to person, place, and time. She appears well-developed and well-nourished. No distress.  Cardiovascular: Normal rate, regular rhythm and normal heart sounds.   Pulmonary/Chest: Effort normal and breath sounds normal.  Abdominal: Soft. Bowel sounds are normal.  Musculoskeletal: Normal range of motion.  Neurological: She is alert and oriented to person, place, and time. No cranial nerve deficit or sensory deficit. She exhibits normal muscle tone. Coordination normal.  Skin: Skin is warm. Rash noted.  Extensive skin rash consistent with eczema with some skin breakdown. No hives. No target lesions no petechiae. Involves hands arms face back.  Nursing note and vitals reviewed.    ED Treatments / Results  Labs (all labs ordered are listed, but only abnormal results are displayed) Labs Reviewed - No data to display  EKG  EKG Interpretation None       Radiology No results found.  Procedures Procedures (including critical care time)  Medications Ordered in  ED Medications  predniSONE (DELTASONE) tablet 60 mg (60 mg Oral Given 02/24/17 6394)     Initial Impression / Assessment and Plan / ED Course  I have reviewed the triage vital signs and the nursing notes.  Pertinent labs & imaging results that were available during my care of the patient were reviewed by me and considered in my medical decision making (see chart for details).   symptoms consistent clinically with an exacerbation of eczema. Do not feel that this is an allergic reaction. Will start patient on prednisone have her follow-up with her dermatologist. Patient feels more comfortable holding the current medication that the dermatologist prescribed last week for now.   Final Clinical Impressions(s) / ED Diagnoses   Final diagnoses:  Other eczema    New Prescriptions New Prescriptions   PREDNISONE (DELTASONE) 10 MG TABLET    Take 4 tablets (40 mg total) by mouth daily.     Fredia Sorrow, MD 02/24/17  [phone]

## 2017-02-24 NOTE — ED Triage Notes (Signed)
Pt c/o pain and swelling to bilateral hands and all over body; pt seen her dermatologist x 1 week ago and was given doxycycline and mupriocin and pt states she is not getting any better

## 2017-03-03 ENCOUNTER — Other Ambulatory Visit: Payer: Self-pay | Admitting: Family Medicine

## 2017-03-03 ENCOUNTER — Telehealth: Payer: Self-pay

## 2017-03-03 NOTE — Telephone Encounter (Signed)
Pt needs appt wit dr Moshe Cipro

## 2017-03-04 ENCOUNTER — Telehealth: Payer: Self-pay | Admitting: Family Medicine

## 2017-03-04 NOTE — Telephone Encounter (Signed)
Left voicemail that patient needed to schedule an appt with Dr.Simpson per a msg I received from clinic staff.

## 2017-03-10 ENCOUNTER — Telehealth: Payer: Self-pay | Admitting: Family Medicine

## 2017-03-10 NOTE — Telephone Encounter (Signed)
Left 2nd vm for patient to schedule  An appt with dr.simpson

## 2017-03-17 ENCOUNTER — Ambulatory Visit (INDEPENDENT_AMBULATORY_CARE_PROVIDER_SITE_OTHER): Payer: Managed Care, Other (non HMO) | Admitting: Family Medicine

## 2017-03-17 VITALS — BP 124/84 | HR 96 | Resp 16 | Ht 64.0 in | Wt 206.0 lb

## 2017-03-17 DIAGNOSIS — I10 Essential (primary) hypertension: Secondary | ICD-10-CM

## 2017-03-17 DIAGNOSIS — R7303 Prediabetes: Secondary | ICD-10-CM

## 2017-03-17 DIAGNOSIS — G5602 Carpal tunnel syndrome, left upper limb: Secondary | ICD-10-CM

## 2017-03-17 DIAGNOSIS — L2084 Intrinsic (allergic) eczema: Secondary | ICD-10-CM

## 2017-03-17 DIAGNOSIS — J4550 Severe persistent asthma, uncomplicated: Secondary | ICD-10-CM | POA: Diagnosis not present

## 2017-03-17 DIAGNOSIS — Z1211 Encounter for screening for malignant neoplasm of colon: Secondary | ICD-10-CM

## 2017-03-17 DIAGNOSIS — E038 Other specified hypothyroidism: Secondary | ICD-10-CM

## 2017-03-17 MED ORDER — HYDROXYZINE HCL 25 MG PO TABS: ORAL_TABLET | ORAL | 3 refills | Status: DC

## 2017-03-17 NOTE — Patient Instructions (Addendum)
Annual exam  In 5 months, call if you need me before  Call [phone] and scedule your mammogram due July 25 or after please  I am referring you to Dr Salley Slaughter for avg risk colonoscopy due after you are  51 in September  I am referring you to Dr Amedeo Plenty for left hand pain and numbness for 2 months, suggestive of carpal tunnel; sndrome  Fasting lipid, cmp , hBa1C, TSH, CBc and vit D 2nd week in July please  It is important that you exercise regularly at least 30 minutes 5 times a week. If you develop chest pain, have severe difficulty breathing, or feel very tired, stop exercising immediately and seek medical attention  Please work on good  health habits so that your health will improve. 1. Commitment to daily physical activity for 30 to 60  minutes, if you are able to do this.  2. Commitment to wise food choices. Aim for half of your  food intake to be vegetable and fruit, one quarter starchy foods, and one quarter protein. Try to eat on a regular schedule  3 meals per day, snacking between meals should be limited to vegetables or fruits or small portions of nuts. 64 ounces of water per day is generally recommended, unless you have specific health conditions, like heart failure or kidney failure where you will need to limit fluid intake.  3. Commitment to sufficient and a  good quality of physical and mental rest daily, generally between 6 to 8 hours per day.  WITH PERSISTANCE AND PERSEVERANCE, THE IMPOSSIBLE , BECOMES THE NORM!   Weight loss goal; pf 12 pounds  New ids hydroxyzine 25 to 50 mg at bedtime for itching

## 2017-03-18 ENCOUNTER — Encounter: Payer: Self-pay | Admitting: Family Medicine

## 2017-03-18 ENCOUNTER — Encounter (INDEPENDENT_AMBULATORY_CARE_PROVIDER_SITE_OTHER): Payer: Self-pay | Admitting: *Deleted

## 2017-03-18 DIAGNOSIS — G5602 Carpal tunnel syndrome, left upper limb: Secondary | ICD-10-CM | POA: Insufficient documentation

## 2017-03-18 NOTE — Assessment & Plan Note (Signed)
Patient educated about the importance of limiting  Carbohydrate intake , the need to commit to daily physical activity for a minimum of 30 minutes , and to commit weight loss. The fact that changes in all these areas will reduce or eliminate all together the development of diabetes is stressed.   Diabetic Labs Latest Ref Rng & Units 12/09/2016 07/18/2016 04/14/2016 10/10/2015 12/05/2014  HbA1c <5.7 % - - - 5.7(H) 5.9(H)  Chol 125 - 200 mg/dL - - 209(H) 210(H) 231(H)  HDL >=46 mg/dL - - 111 101 110  Calc LDL <130 mg/dL - - 87 85 100(H)  Triglycerides <150 mg/dL - - 56 121 107  Creatinine 0.44 - 1.00 mg/dL 0.77 0.73 0.85 0.74 -   BP/Weight 03/17/2017 02/24/2017 12/09/2016 08/12/2016 08/07/2016 07/18/2016 82/06/5620  Systolic BP 308 657 846 962 952 841 324  Diastolic BP 84 97 91 84 76 91 100  Wt. (Lbs) 206 185 190 - - 190 190  BMI 35.36 32.77 32.61 - - 32.61 32.61   No flowsheet data found.  Updated lab needed

## 2017-03-18 NOTE — Assessment & Plan Note (Signed)
Deteriorated. Patient re-educated about  the importance of commitment to a  minimum of 150 minutes of exercise per week.  The importance of healthy food choices with portion control discussed. Encouraged to start a food diary, count calories and to consider  joining a support group. Sample diet sheets offered. Goals set by the patient for the next several months.   Weight /BMI 03/17/2017 02/24/2017 12/09/2016  WEIGHT 206 lb 185 lb 190 lb  HEIGHT 5\' 4"  5\' 3"  5\' 4"   BMI 35.36 kg/m2 32.77 kg/m2 32.61 kg/m2

## 2017-03-18 NOTE — Assessment & Plan Note (Signed)
Controlled, no change in medication  

## 2017-03-18 NOTE — Assessment & Plan Note (Signed)
Current flare being treated by dermatology, increase dose of hydroxyzine due to excess itching at night

## 2017-03-18 NOTE — Progress Notes (Signed)
[name]     MRN: [id number]      DOB: 10-Mar-1966   HPI [name] is here for follow up and re-evaluation of chronic medical conditions, medication management and review of any available recent lab and radiology data.  Preventive health is updated, specifically  Cancer screening and Immunization.   Has been out of work for months because of foot surgery, and recently treated for her eczema by dermatology, states however that she is unable to sleep because of excessive itching, requests higher dose of hydroxyzine C/o numbness in left hand and pain which awakens her has to shake hand for relief, has been treated for carpal tunnel in right hand in the past  ROS Denies recent fever or chills. Denies sinus pressure, nasal congestion, ear pain or sore throat. Denies chest congestion, productive cough or wheezing. Denies chest pains, palpitations and leg swelling Denies abdominal pain, nausea, vomiting,diarrhea or constipation.   Denies dysuria, frequency, hesitancy or incontinence. Denies joint pain, swelling and limitation in mobility. Denies headaches, seizures, numbness, or tingling. Denies depression, anxiety or insomnia.    PE  BP 124/84   Pulse 96   Resp 16   Ht 5\' 4"  (1.626 m)   Wt 206 lb (93.4 kg)   LMP 02/17/2017   SpO2 98%   BMI 35.36 kg/m   Patient alert and oriented and in no cardiopulmonary distress.  HEENT: No facial asymmetry, EOMI,   oropharynx pink and moist.  Neck supple no JVD, no mass.  Chest: Clear to auscultation bilaterally.  CVS: S1, S2 no murmurs, no S3.Regular rate.  ABD: Soft non tender.   Ext: No edema  MS: Adequate ROM spine, shoulders, hips and knees. Left thenar wasting and positive tinnel's  Skin: erythema and macular rash and wheels  Psych: Good eye contact, normal affect. Memory intact not anxious or depressed appearing.  CNS: CN 2-12 intact, power,  normal throughout.no focal deficits noted.   Assessment & Plan  Essential  hypertension Controlled, no change in medication DASH diet and commitment to daily physical activity for a minimum of 30 minutes discussed and encouraged, as a part of hypertension management. The importance of attaining a healthy weight is also discussed.  BP/Weight 03/17/2017 02/24/2017 12/09/2016 08/12/2016 08/07/2016 07/18/2016 45/03/2562  Systolic BP 893 734 287 681 157 262 035  Diastolic BP 84 97 91 84 76 91 100  Wt. (Lbs) 206 185 190 - - 190 190  BMI 35.36 32.77 32.61 - - 32.61 32.61       Morbid obesity (HCC) Deteriorated. Patient re-educated about  the importance of commitment to a  minimum of 150 minutes of exercise per week.  The importance of healthy food choices with portion control discussed. Encouraged to start a food diary, count calories and to consider  joining a support group. Sample diet sheets offered. Goals set by the patient for the next several months.   Weight /BMI 03/17/2017 02/24/2017 12/09/2016  WEIGHT 206 lb 185 lb 190 lb  HEIGHT 5\' 4"  5\' 3"  5\' 4"   BMI 35.36 kg/m2 32.77 kg/m2 32.61 kg/m2      Prediabetes Patient educated about the importance of limiting  Carbohydrate intake , the need to commit to daily physical activity for a minimum of 30 minutes , and to commit weight loss. The fact that changes in all these areas will reduce or eliminate all together the development of diabetes is stressed.   Diabetic Labs Latest Ref Rng & Units 12/09/2016 07/18/2016 04/14/2016 10/10/2015 12/05/2014  HbA1c <5.7 % - - - 5.7(H) 5.9(H)  Chol 125 - 200 mg/dL - - 209(H) 210(H) 231(H)  HDL >=46 mg/dL - - 111 101 110  Calc LDL <130 mg/dL - - 87 85 100(H)  Triglycerides <150 mg/dL - - 56 121 107  Creatinine 0.44 - 1.00 mg/dL 0.77 0.73 0.85 0.74 -   BP/Weight 03/17/2017 02/24/2017 12/09/2016 08/12/2016 08/07/2016 07/18/2016 73/02/3298  Systolic BP 242 683 419 622 297 989 211  Diastolic BP 84 97 91 84 76 91 100  Wt. (Lbs) 206 185 190 - - 190 190  BMI 35.36 32.77 32.61 - - 32.61 32.61     No flowsheet data found.  Updated lab needed    Eczema Current flare being treated by dermatology, increase dose of hydroxyzine due to excess itching at night  Severe persistent asthma Controlled, no change in medication   Left carpal tunnel syndrome inreased pain and numbness disturbing sleep, refer to orthopedics

## 2017-03-18 NOTE — Assessment & Plan Note (Signed)
inreased pain and numbness disturbing sleep, refer to orthopedics

## 2017-03-18 NOTE — Assessment & Plan Note (Signed)
Controlled, no change in medication DASH diet and commitment to daily physical activity for a minimum of 30 minutes discussed and encouraged, as a part of hypertension management. The importance of attaining a healthy weight is also discussed.  BP/Weight 03/17/2017 02/24/2017 12/09/2016 08/12/2016 08/07/2016 07/18/2016 34/06/1790  Systolic BP 505 697 948 016 553 748 270  Diastolic BP 84 97 91 84 76 91 100  Wt. (Lbs) 206 185 190 - - 190 190  BMI 35.36 32.77 32.61 - - 32.61 32.61

## 2017-04-27 ENCOUNTER — Other Ambulatory Visit: Payer: Self-pay | Admitting: Family Medicine

## 2017-04-29 ENCOUNTER — Other Ambulatory Visit: Payer: Self-pay | Admitting: Family Medicine

## 2017-05-21 ENCOUNTER — Telehealth: Payer: Self-pay

## 2017-05-21 ENCOUNTER — Other Ambulatory Visit: Payer: Self-pay | Admitting: Family Medicine

## 2017-05-21 MED ORDER — HYDROXYZINE HCL 50 MG PO TABS: ORAL_TABLET | ORAL | 3 refills | Status: DC

## 2017-05-21 NOTE — Telephone Encounter (Signed)
Has been taking 2-3 hydroxyzine at bedtime and she has ran out a few days early. Wants to know if you can send it in for tid so she can get it filled now and have enough to last all month

## 2017-05-21 NOTE — Telephone Encounter (Signed)
Dose change to 50 mg hydroxyzine , and 1 to 2 , this is printed , pls notify pt and pharmacy

## 2017-05-21 NOTE — Telephone Encounter (Signed)
Med changed faxed and left patient a message that it had been done

## 2017-05-22 ENCOUNTER — Other Ambulatory Visit: Payer: Self-pay | Admitting: Family Medicine

## 2017-05-22 MED ORDER — HYDROXYZINE HCL 25 MG PO TABS: ORAL_TABLET | ORAL | 5 refills | Status: DC

## 2017-05-22 NOTE — Progress Notes (Signed)
hydroxuzine

## 2017-06-08 ENCOUNTER — Telehealth: Payer: Self-pay | Admitting: Family Medicine

## 2017-06-08 ENCOUNTER — Other Ambulatory Visit: Payer: Self-pay | Admitting: Family Medicine

## 2017-06-08 MED ORDER — PREDNISONE 5 MG (21) PO TBPK: 5.0000 mg | ORAL_TABLET | ORAL | 0 refills | Status: DC

## 2017-06-08 NOTE — Progress Notes (Unsigned)
pred 

## 2017-06-08 NOTE — Telephone Encounter (Signed)
Patient informed of message below, verbalized understanding.  

## 2017-06-08 NOTE — Telephone Encounter (Signed)
Patient called and left message on nurse line stating she has a break out on neck, asks for prednisone to be sent to pharmacy. [facility] 

## 2017-06-08 NOTE — Telephone Encounter (Signed)
Sent pls let her know

## 2017-08-17 ENCOUNTER — Other Ambulatory Visit: Payer: Self-pay | Admitting: Family Medicine

## 2017-08-17 ENCOUNTER — Telehealth: Payer: Self-pay | Admitting: Family Medicine

## 2017-08-17 NOTE — Telephone Encounter (Signed)
Patient is requesting refill of her asthma medication Says she needs it asap  Cb#: [phone]

## 2017-08-17 NOTE — Telephone Encounter (Signed)
Done

## 2017-08-19 ENCOUNTER — Ambulatory Visit: Payer: Self-pay | Admitting: Family Medicine

## 2017-09-09 ENCOUNTER — Emergency Department (HOSPITAL_COMMUNITY)
Admission: EM | Admit: 2017-09-09 | Discharge: 2017-09-09 | Disposition: A | Discharge status: Home / Self Care | Payer: Managed Care, Other (non HMO) | Attending: Emergency Medicine | Admitting: Emergency Medicine

## 2017-09-09 ENCOUNTER — Other Ambulatory Visit: Payer: Self-pay

## 2017-09-09 ENCOUNTER — Encounter (HOSPITAL_COMMUNITY): Payer: Self-pay | Admitting: *Deleted

## 2017-09-09 DIAGNOSIS — Z79899 Other long term (current) drug therapy: Secondary | ICD-10-CM | POA: Insufficient documentation

## 2017-09-09 DIAGNOSIS — Z91040 Latex allergy status: Secondary | ICD-10-CM | POA: Insufficient documentation

## 2017-09-09 DIAGNOSIS — T7840XA Allergy, unspecified, initial encounter: Secondary | ICD-10-CM

## 2017-09-09 DIAGNOSIS — I10 Essential (primary) hypertension: Secondary | ICD-10-CM | POA: Diagnosis not present

## 2017-09-09 DIAGNOSIS — R21 Rash and other nonspecific skin eruption: Secondary | ICD-10-CM

## 2017-09-09 DIAGNOSIS — J45909 Unspecified asthma, uncomplicated: Secondary | ICD-10-CM | POA: Insufficient documentation

## 2017-09-09 MED ORDER — FAMOTIDINE 20 MG PO TABS
20.0000 mg | ORAL_TABLET | Freq: Once | ORAL | Status: AC
  Administered 2017-09-09: 20 mg via ORAL
  Filled 2017-09-09: qty 1

## 2017-09-09 MED ORDER — FAMOTIDINE 20 MG PO TABS: 20.0000 mg | ORAL_TABLET | Freq: Two times a day (BID) | ORAL | 0 refills | Status: DC

## 2017-09-09 MED ORDER — PREDNISONE 10 MG PO TABS: 40.0000 mg | ORAL_TABLET | Freq: Every day | ORAL | 0 refills | Status: DC

## 2017-09-09 MED ORDER — PREDNISONE 50 MG PO TABS
60.0000 mg | ORAL_TABLET | Freq: Once | ORAL | Status: AC
  Administered 2017-09-09: 60 mg via ORAL
  Filled 2017-09-09: qty 1

## 2017-09-09 MED ORDER — DIPHENHYDRAMINE HCL 25 MG PO CAPS
25.0000 mg | ORAL_CAPSULE | Freq: Once | ORAL | Status: AC
  Administered 2017-09-09: 25 mg via ORAL
  Filled 2017-09-09: qty 1

## 2017-09-09 MED ORDER — DIPHENHYDRAMINE HCL 25 MG PO CAPS: 25.0000 mg | ORAL_CAPSULE | Freq: Four times a day (QID) | ORAL | 0 refills | Status: DC | PRN

## 2017-09-09 NOTE — Discharge Instructions (Signed)
Take the medications as directed.  Take the prednisone for the next 5 days.  Take the Pepcid for the next 7 days.  Take the Benadryl for the next 3 days.  Return for any new or worse symptoms.  Make an appointment to follow-up with your regular doctor.

## 2017-09-09 NOTE — ED Provider Notes (Addendum)
[facility] EMERGENCY DEPARTMENT Provider Note   CSN: [id number] Arrival date & time: 09/09/17  1738     History   Chief Complaint Chief Complaint  Patient presents with  . Rash    HPI [name] is a 51 y.o. female.     Past Medical History:  Diagnosis Date  . Asthma 2010  . Eczema since childhoosd   flare since 2011  . Hypertension     Patient Active Problem List   Diagnosis Date Noted  . Left carpal tunnel syndrome 03/18/2017  . Severe persistent asthma 04/15/2016  . Severe persistent chronic asthma with acute exacerbation 08/29/2015  . Abnormal thyroid ultrasound 12/29/2014  . Hyperlipidemia LDL goal <100 12/22/2014  . Prediabetes 12/22/2014  . Baker's cyst 10/28/2011  . Menorrhagia with irregular cycle 05/22/2011  . PEPTIC ULCER DISEASE, HELICOBACTER PYLORI POSITIVE 11/12/2010  . Hypothyroidism 10/30/2010  . SCIATICA, LEFT 01/18/2010  . Eczema 12/31/2009  . Morbid obesity ([place]) 11/02/2008  . Essential hypertension 11/02/2008  . Allergic rhinitis 11/02/2008  . DEGENERATIVE JOINT DISEASE, LEFT KNEE 11/02/2008    Past Surgical History:  Procedure Laterality Date  . ANKLE SURGERY    . BACK SURGERY    . KNEE SURGERY    . TUBAL LIGATION      OB History    No data available       Home Medications    Prior to Admission medications   Medication Sig Start Date End Date Taking? Authorizing Provider  albuterol (PROVENTIL HFA;VENTOLIN HFA) 108 (90 Base) MCG/ACT inhaler Inhale 2 puffs into the lungs every 6 (six) hours as needed for wheezing or shortness of breath. 05/21/16   Fayrene Helper, MD  albuterol (PROVENTIL) (2.5 MG/3ML) 0.083% nebulizer solution Take 3 mLs (2.5 mg total) by nebulization every 4 (four) hours as needed for wheezing or shortness of breath. 12/09/16   Francine Graven, DO  amLODipine (NORVASC) 10 MG tablet TAKE 1 TABLET (10 MG TOTAL) BY MOUTH DAILY. 04/27/17   Fayrene Helper, MD  diphenhydrAMINE (BENADRYL) 25 mg capsule  Take 1 capsule (25 mg total) by mouth every 6 (six) hours as needed. 09/09/17   Fredia Sorrow, MD  diphenhydrAMINE (BENADRYL) 25 MG tablet Take 1 tablet (25 mg total) by mouth every 6 (six) hours. 07/18/16   Rancour, Annie Main, MD  famotidine (PEPCID) 20 MG tablet Take 1 tablet (20 mg total) by mouth daily. 07/18/16   Rancour, Annie Main, MD  famotidine (PEPCID) 20 MG tablet Take 1 tablet (20 mg total) by mouth 2 (two) times daily. 09/09/17   Fredia Sorrow, MD  fluticasone (FLONASE) 50 MCG/ACT nasal spray 1-2 every 12hours Patient taking differently: Place 1 spray into both nostrils daily as needed for allergies.  10/24/15   Tanda Rockers, MD  hydrOXYzine (ATARAX/VISTARIL) 25 MG tablet Two to three tablets at bedtime 05/22/17   Fayrene Helper, MD  ipratropium-albuterol (DUONEB) 0.5-2.5 (3) MG/3ML SOLN USE ONE VIAL VIA NEBULIZER EVERY 8 HOURS AS NEEDED, FOR SEVERE WHEEZING AS DIRECTED 03/09/15   Fayrene Helper, MD  levothyroxine (SYNTHROID, LEVOTHROID) 50 MCG tablet TAKE 1 TABLET BY MOUTH IN THE MORNING ON MONDAY-THURSDAY, AND 1 AND 1/2 TABLET ON [facility] 09/19/16   Fayrene Helper, MD  montelukast (SINGULAIR) 10 MG tablet TAKE 1 TABLET (10 MG TOTAL) BY MOUTH DAILY. 04/29/17   Fayrene Helper, MD  mupirocin ointment (BACTROBAN) 2 % Apply to wound twice a day. 08/07/16   Hyatt, Max T, DPM  predniSONE (DELTASONE) 10 MG  tablet Take 4 tablets (40 mg total) by mouth daily. 09/09/17   Fredia Sorrow, MD  predniSONE (STERAPRED UNI-PAK 21 TAB) 5 MG (21) TBPK tablet Take 1 tablet (5 mg total) by mouth as directed. Use as directed 06/08/17   Fayrene Helper, MD  SYMBICORT 160-4.5 MCG/ACT inhaler INHALE 2 PUFFS INTO [facility] LUNGS 2 TIMES A DAY 08/17/17   Fayrene Helper, MD  triamcinolone cream (KENALOG) 0.1 % APPLY TO AFFECTED AREA ON BODY (NOT FACE) TWICE DAILY FOR 1-2 WEEKS 12/30/16   Fayrene Helper, MD    Family History Family History  Problem Relation Age of Onset  . Asthma  Paternal Aunt   . Asthma Paternal Uncle     Social History Social History   Tobacco Use  . Smoking status: Never Smoker  . Smokeless tobacco: Never Used  Substance Use Topics  . Alcohol use: Yes    Comment: occassionally  . Drug use: No     Allergies   Bactrim [sulfamethoxazole-trimethoprim]; Penicillins; and Latex   Review of Systems Review of Systems  Constitutional: Negative for appetite change and fever.  HENT: Positive for facial swelling. Negative for congestion, sore throat, trouble swallowing and voice change.   Eyes: Negative for redness.  Respiratory: Negative for shortness of breath.   Cardiovascular: Negative for chest pain.  Gastrointestinal: Negative for abdominal pain, nausea and vomiting.  Genitourinary: Negative for dysuria.  Musculoskeletal: Negative for back pain.  Skin: Positive for rash.  Allergic/Immunologic: Positive for immunocompromised state.  Neurological: Negative for headaches.  Hematological: Does not bruise/bleed easily.  Psychiatric/Behavioral: Negative for confusion.     Physical Exam Updated Vital Signs BP (!) 149/86 (BP Location: Right Arm)   Pulse 92   Temp 98.2 F (36.8 C) (Oral)   Resp 16   Ht 1.6 m (5\' 3" )   Wt 90.7 kg (200 lb)   SpO2 96%   BMI 35.43 kg/m   Physical Exam  Constitutional: She is oriented to person, place, and time. She appears well-developed and well-nourished. No distress.  HENT:  Head: Normocephalic and atraumatic.  Mouth/Throat: Oropharynx is clear and moist. No oropharyngeal exudate.  No significant tongue swelling mild lip swelling.  Marked erythema to the anterior face and posterior neck.  No vesicles no skin peeling.  Eyes: Conjunctivae and EOM are normal. Pupils are equal, round, and reactive to light.  Neck: Normal range of motion. Neck supple.  Cardiovascular: Normal rate.  Pulmonary/Chest: Effort normal and breath sounds normal. No respiratory distress. She has no wheezes.  Abdominal: Soft.  Bowel sounds are normal. She exhibits no distension. There is no tenderness.  Musculoskeletal: Normal range of motion.  Neurological: She is alert and oriented to person, place, and time. No cranial nerve deficit or sensory deficit. She exhibits normal muscle tone. Coordination normal.  Skin: Skin is warm. Rash noted. There is erythema.  Rough skin to the hands consistent with eczema.  Some areas in the posterior scalp area consistent with eczema.  Nursing note and vitals reviewed.    ED Treatments / Results  Labs (all labs ordered are listed, but only abnormal results are displayed) Labs Reviewed - No data to display  EKG  EKG Interpretation None       Radiology No results found.  Procedures Procedures (including critical care time)  Medications Ordered in ED Medications  diphenhydrAMINE (BENADRYL) capsule 25 mg (25 mg Oral Given 09/09/17 1839)  predniSONE (DELTASONE) tablet 60 mg (60 mg Oral Given 09/09/17 1838)  famotidine (PEPCID)  tablet 20 mg (20 mg Oral Given 09/09/17 1838)     Initial Impression / Assessment and Plan / ED Course  I have reviewed the triage vital signs and the nursing notes.  Pertinent labs & imaging results that were available during my care of the patient were reviewed by me and considered in my medical decision making (see chart for details).    Workup here today consistent with an erythematous allergic reaction to the skin of the face and posterior neck.  No complicating factors with airway lip swelling or tongue swelling.  No wheezing.  Patient was exposed to a new type of powder at work it could be the cause.  We will treat with prednisone Pepcid and Benadryl.  Patient will follow up with primary care provider she will return for any new or worse symptoms.   Final Clinical Impressions(s) / ED Diagnoses   Final diagnoses:  Rash  Allergic reaction, initial encounter    ED Discharge Orders        Ordered    predniSONE (DELTASONE) 10 MG  tablet  Daily     09/09/17 1927    famotidine (PEPCID) 20 MG tablet  2 times daily     09/09/17 1927    diphenhydrAMINE (BENADRYL) 25 mg capsule  Every 6 hours PRN     09/09/17 1927       Fredia Sorrow, MD 09/09/17 Lattie Corns    Fredia Sorrow, MD 09/09/17 1940    Fredia Sorrow, MD 09/09/17 1946

## 2017-09-09 NOTE — ED Triage Notes (Addendum)
Pt has redness, swelling, itching to face that started yesterday. Denies difficulty breathing. Pt hasn't used anything new or ate any new foods that she is aware of.

## 2017-10-01 ENCOUNTER — Ambulatory Visit: Payer: Self-pay | Admitting: Family Medicine

## 2017-11-16 ENCOUNTER — Other Ambulatory Visit: Payer: Self-pay

## 2017-11-16 MED ORDER — TRIAMCINOLONE ACETONIDE 0.1 % EX CREA: TOPICAL_CREAM | CUTANEOUS | 1 refills | Status: DC

## 2017-11-26 ENCOUNTER — Other Ambulatory Visit: Payer: Self-pay | Admitting: Family Medicine

## 2017-11-30 ENCOUNTER — Other Ambulatory Visit: Payer: Self-pay

## 2017-11-30 MED ORDER — BUDESONIDE-FORMOTEROL FUMARATE 160-4.5 MCG/ACT IN AERO: INHALATION_SPRAY | RESPIRATORY_TRACT | 3 refills | Status: DC

## 2017-12-02 ENCOUNTER — Telehealth: Payer: Self-pay | Admitting: Family Medicine

## 2017-12-02 DIAGNOSIS — E785 Hyperlipidemia, unspecified: Secondary | ICD-10-CM

## 2017-12-02 DIAGNOSIS — E038 Other specified hypothyroidism: Secondary | ICD-10-CM

## 2017-12-02 DIAGNOSIS — I10 Essential (primary) hypertension: Secondary | ICD-10-CM

## 2017-12-02 DIAGNOSIS — R7303 Prediabetes: Secondary | ICD-10-CM

## 2017-12-02 NOTE — Telephone Encounter (Signed)
Done

## 2017-12-02 NOTE — Telephone Encounter (Signed)
Please redo lab orders from [facility] to [place]. Please also print them and place them up front, as patient will come by to pick up

## 2017-12-16 ENCOUNTER — Other Ambulatory Visit: Payer: Self-pay

## 2017-12-16 ENCOUNTER — Other Ambulatory Visit: Payer: Self-pay | Admitting: Family Medicine

## 2017-12-16 MED ORDER — TACROLIMUS 0.1 % EX OINT: TOPICAL_OINTMENT | CUTANEOUS | 0 refills | Status: DC

## 2017-12-24 ENCOUNTER — Encounter: Payer: Self-pay | Admitting: Family Medicine

## 2017-12-24 ENCOUNTER — Ambulatory Visit (INDEPENDENT_AMBULATORY_CARE_PROVIDER_SITE_OTHER): Payer: 59 | Admitting: Family Medicine

## 2017-12-24 VITALS — BP 120/80 | HR 93 | Temp 99.2°F | Resp 16 | Ht 64.0 in | Wt 197.0 lb

## 2017-12-24 DIAGNOSIS — I10 Essential (primary) hypertension: Secondary | ICD-10-CM | POA: Diagnosis not present

## 2017-12-24 DIAGNOSIS — J029 Acute pharyngitis, unspecified: Secondary | ICD-10-CM | POA: Diagnosis not present

## 2017-12-24 DIAGNOSIS — J209 Acute bronchitis, unspecified: Secondary | ICD-10-CM | POA: Diagnosis not present

## 2017-12-24 DIAGNOSIS — J0100 Acute maxillary sinusitis, unspecified: Secondary | ICD-10-CM

## 2017-12-24 LAB — POCT RAPID STREP A (OFFICE): Rapid Strep A Screen: NEGATIVE

## 2017-12-24 MED ORDER — LEVOFLOXACIN 500 MG PO TABS: 500.0000 mg | ORAL_TABLET | Freq: Every day | ORAL | 0 refills | Status: DC

## 2017-12-24 MED ORDER — BENZONATATE 100 MG PO CAPS: 100.0000 mg | ORAL_CAPSULE | Freq: Two times a day (BID) | ORAL | 0 refills | Status: DC | PRN

## 2017-12-24 MED ORDER — FIRST-DUKES MOUTHWASH MT SUSP: OROMUCOSAL | 0 refills | Status: DC

## 2017-12-24 MED ORDER — FLUCONAZOLE 150 MG PO TABS
ORAL_TABLET | ORAL | 0 refills | Status: DC
Start: 2017-12-24 — End: 2018-01-09

## 2017-12-24 NOTE — Patient Instructions (Addendum)
F/u as before, call if you need me sooner  You do not have strep throat  You are treated for sinusitis and bronchitis  Levaquin, tessalon perles, and fluconazole are prescribed  Mouthwash is prescribed as a gargle for symptom relief

## 2017-12-24 NOTE — Progress Notes (Signed)
   [name]     MRN: [id number]      DOB: [birth year]   HPI [name] is here  2 week h/o worsening head and chest congestion, associated Nasal drainage has thickened , and is yellowish green, . Sputum is thick and yellow.  and sore throat. Increasing fatigue , poor appetitie and sleep disturbed by cough. No improvement with OTC medication.  ROS   Denies chest pains, palpitations and leg swelling Denies abdominal pain, nausea, vomiting,diarrhea or constipation.   Denies dysuria, frequency, hesitancy or incontinence. Denies joint pain, swelling and limitation in mobility. Denies headaches, seizures, numbness, or tingling. Denies depression, anxiety or insomnia. D   PE  BP 120/80   Pulse 93   Temp 99.2 F (37.3 C) (Oral)   Resp 16   Ht 5\' 4"  (1.626 m)   Wt 197 lb (89.4 kg)   SpO2 95%   BMI 33.81 kg/m   Patient alert and oriented and in no cardiopulmonary distress.  HEENT: No facial asymmetry, EOMI,   oropharynx pink and moist.  Maxillary sinus tenderness, TM clear, anterior cervical adenitis Oropharynx erythematous , no exudate Chest: decreased air entry , scattered crackles , and wheezes.  CVS: S1, S2 no murmurs, no S3.Regular rate.  ABD: Soft non tender.   Ext: No edema  MS: Adequate ROM spine, shoulders, hips and knees.  Skin: Intact,macularrash noted  Psych: Good eye contact, normal affect. Memory intact not anxious or depressed appearing.  CNS: CN 2-12 intact, power,  normal throughout.no focal deficits noted.   Assessment & Plan  Acute sinusitis Antibiotic course prescribed  Acute bronchitis Decongestant and antibiotic prescribed  Sore throat Rapid strep test is negative Mouthwash prescribed for symptomatic relief  Essential hypertension Controlled, no change in medication DASH diet and commitment to daily physical activity for a minimum of 30 minutes discussed and encouraged, as a part of hypertension management. The importance of  attaining a healthy weight is also discussed.  BP/Weight 12/24/2017 09/09/2017 03/17/2017 02/24/2017 12/09/2016 08/12/2016 73/53/2992  Systolic BP 426 834 196 222 979 892 119  Diastolic BP 80 417 84 97 91 84 76  Wt. (Lbs) 197 200 206 185 190 - -  BMI 33.81 35.43 35.36 32.77 32.61 - -

## 2017-12-25 LAB — COMPLETE METABOLIC PANEL WITH GFR
AG RATIO: 1.3 (calc) (ref 1.0–2.5)
ALKALINE PHOSPHATASE (APISO): 79 U/L (ref 33–130)
ALT: 15 U/L (ref 6–29)
AST: 18 U/L (ref 10–35)
Albumin: 4 g/dL (ref 3.6–5.1)
BUN: 16 mg/dL (ref 7–25)
CALCIUM: 9.3 mg/dL (ref 8.6–10.4)
CHLORIDE: 104 mmol/L (ref 98–110)
CO2: 28 mmol/L (ref 20–32)
Creat: 0.82 mg/dL (ref 0.50–1.05)
GFR, EST NON AFRICAN AMERICAN: 83 mL/min/{1.73_m2} (ref >=60)
GFR, Est African American: 97 mL/min/{1.73_m2} (ref >=60)
GLOBULIN: 3.1 g/dL (ref 1.9–3.7)
Glucose, Bld: 80 mg/dL (ref 65–99)
POTASSIUM: 4.6 mmol/L (ref 3.5–5.3)
SODIUM: 137 mmol/L (ref 135–146)
Total Bilirubin: 0.3 mg/dL (ref 0.2–1.2)
Total Protein: 7.1 g/dL (ref 6.1–8.1)

## 2017-12-25 LAB — TSH: TSH: 3.35 mIU/L

## 2017-12-25 LAB — CBC
HCT: 40.4 % (ref 35.0–45.0)
Hemoglobin: 13.3 g/dL (ref 11.7–15.5)
MCH: 27.8 pg (ref 27.0–33.0)
MCHC: 32.9 g/dL (ref 32.0–36.0)
MCV: 84.3 fL (ref 80.0–100.0)
MPV: 10.9 fL (ref 7.5–12.5)
PLATELETS: 376 10*3/uL (ref 140–400)
RBC: 4.79 10*6/uL (ref 3.80–5.10)
RDW: 14.2 % (ref 11.0–15.0)
WBC: 10.3 10*3/uL (ref 3.8–10.8)

## 2017-12-25 LAB — LIPID PANEL
CHOL/HDL RATIO: 2.9 (calc) (ref <5.0)
CHOLESTEROL: 218 mg/dL — AB (ref <200)
HDL: 76 mg/dL (ref >50)
LDL Cholesterol (Calc): 113 mg/dL (calc) — ABNORMAL HIGH
Non-HDL Cholesterol (Calc): 142 mg/dL (calc) — ABNORMAL HIGH (ref <130)
Triglycerides: 163 mg/dL — ABNORMAL HIGH (ref <150)

## 2017-12-25 LAB — HEMOGLOBIN A1C
EAG (MMOL/L): 6.3 (calc)
HEMOGLOBIN A1C: 5.6 %{Hb} (ref <5.7)
MEAN PLASMA GLUCOSE: 114 (calc)

## 2017-12-25 LAB — VITAMIN D 25 HYDROXY (VIT D DEFICIENCY, FRACTURES): VIT D 25 HYDROXY: 13 ng/mL — AB (ref 30–100)

## 2017-12-27 DIAGNOSIS — J029 Acute pharyngitis, unspecified: Secondary | ICD-10-CM | POA: Insufficient documentation

## 2017-12-27 DIAGNOSIS — J209 Acute bronchitis, unspecified: Secondary | ICD-10-CM | POA: Insufficient documentation

## 2017-12-27 NOTE — Assessment & Plan Note (Signed)
Antibiotic course prescribed 

## 2017-12-27 NOTE — Assessment & Plan Note (Signed)
Decongestant and antibiotic prescribed 

## 2017-12-27 NOTE — Assessment & Plan Note (Signed)
Controlled, no change in medication DASH diet and commitment to daily physical activity for a minimum of 30 minutes discussed and encouraged, as a part of hypertension management. The importance of attaining a healthy weight is also discussed.  BP/Weight 12/24/2017 09/09/2017 03/17/2017 02/24/2017 12/09/2016 08/12/2016 20/25/4270  Systolic BP 623 762 831 517 616 073 710  Diastolic BP 80 626 84 97 91 84 76  Wt. (Lbs) 197 200 206 185 190 - -  BMI 33.81 35.43 35.36 32.77 32.61 - -

## 2017-12-27 NOTE — Assessment & Plan Note (Signed)
Rapid strep test is negative Mouthwash prescribed for symptomatic relief

## 2018-01-05 ENCOUNTER — Other Ambulatory Visit: Payer: Self-pay

## 2018-01-05 ENCOUNTER — Encounter: Payer: Self-pay | Admitting: Family Medicine

## 2018-01-05 ENCOUNTER — Ambulatory Visit (INDEPENDENT_AMBULATORY_CARE_PROVIDER_SITE_OTHER): Payer: 59 | Admitting: Family Medicine

## 2018-01-05 VITALS — BP 124/80 | HR 83 | Temp 97.8°F | Ht 64.0 in | Wt 202.8 lb

## 2018-01-05 DIAGNOSIS — E66811 Obesity, class 1: Secondary | ICD-10-CM

## 2018-01-05 DIAGNOSIS — R7303 Prediabetes: Secondary | ICD-10-CM | POA: Diagnosis not present

## 2018-01-05 DIAGNOSIS — J4550 Severe persistent asthma, uncomplicated: Secondary | ICD-10-CM | POA: Diagnosis not present

## 2018-01-05 DIAGNOSIS — F321 Major depressive disorder, single episode, moderate: Secondary | ICD-10-CM

## 2018-01-05 DIAGNOSIS — Z1231 Encounter for screening mammogram for malignant neoplasm of breast: Secondary | ICD-10-CM | POA: Diagnosis not present

## 2018-01-05 DIAGNOSIS — I10 Essential (primary) hypertension: Secondary | ICD-10-CM

## 2018-01-05 DIAGNOSIS — E785 Hyperlipidemia, unspecified: Secondary | ICD-10-CM

## 2018-01-05 DIAGNOSIS — E669 Obesity, unspecified: Secondary | ICD-10-CM | POA: Diagnosis not present

## 2018-01-05 DIAGNOSIS — L2084 Intrinsic (allergic) eczema: Secondary | ICD-10-CM | POA: Diagnosis not present

## 2018-01-05 DIAGNOSIS — Z1211 Encounter for screening for malignant neoplasm of colon: Secondary | ICD-10-CM

## 2018-01-05 MED ORDER — VENLAFAXINE HCL ER 37.5 MG PO CP24: 37.5000 mg | ORAL_CAPSULE | Freq: Every day | ORAL | 5 refills | Status: DC

## 2018-01-05 MED ORDER — ERGOCALCIFEROL 1.25 MG (50000 UT) PO CAPS: 50000.0000 [IU] | ORAL_CAPSULE | ORAL | 3 refills | Status: DC

## 2018-01-05 NOTE — Patient Instructions (Addendum)
F/u in 8 to 10 weeks, call if you need me before  Please schedule mammogram at checkout , 4 o clock appt in [place] please  You are referred to Dr Laural Golden they will mail you a letter to call for colonoscopy  New med for depression daily and once weekly vit D3  Please reduce fried ad fatty foods  Commit to regular exercise as you start feeling better and increased vegetable , fruit and wtaer in your diet , I am excited!  Thank you  for choosing [facility]. We consider it a privelige to serve you.  Delivering excellent health care in a caring and  compassionate way is our goal.  Partnering with you,  so that together we can achieve this goal is our strategy.

## 2018-01-06 ENCOUNTER — Encounter (INDEPENDENT_AMBULATORY_CARE_PROVIDER_SITE_OTHER): Payer: Self-pay | Admitting: *Deleted

## 2018-01-09 ENCOUNTER — Encounter: Payer: Self-pay | Admitting: Family Medicine

## 2018-01-09 DIAGNOSIS — F321 Major depressive disorder, single episode, moderate: Secondary | ICD-10-CM | POA: Insufficient documentation

## 2018-01-09 NOTE — Assessment & Plan Note (Signed)
Hyperlipidemia:Low fat diet discussed and encouraged.   Lipid Panel  Lab Results  Component Value Date   CHOL 218 (H) 12/24/2017   HDL 76 12/24/2017   LDLCALC 113 (H) 12/24/2017   TRIG 163 (H) 12/24/2017   CHOLHDL 2.9 12/24/2017   Uncontrolled, needs ot reduce fried and fatty foods

## 2018-01-09 NOTE — Assessment & Plan Note (Signed)
Controlled, no change in medication DASH diet and commitment to daily physical activity for a minimum of 30 minutes discussed and encouraged, as a part of hypertension management. The importance of attaining a healthy weight is also discussed.  BP/Weight 01/05/2018 12/24/2017 09/09/2017 03/17/2017 02/24/2017 12/09/2016 97/74/1423  Systolic BP 953 202 334 356 861 683 729  Diastolic BP 80 80 021 84 97 91 84  Wt. (Lbs) 202.8 197 200 206 185 190 -  BMI 34.81 33.81 35.43 35.36 32.77 32.61 -

## 2018-01-09 NOTE — Assessment & Plan Note (Signed)
Controlled, no change in medication  

## 2018-01-09 NOTE — Assessment & Plan Note (Signed)
Patient educated about the importance of limiting  Carbohydrate intake , the need to commit to daily physical activity for a minimum of 30 minutes , and to commit weight loss. The fact that changes in all these areas will reduce or eliminate all together the development of diabetes is stressed.  Resolved, which is great  Diabetic Labs Latest Ref Rng & Units 12/24/2017 12/09/2016 07/18/2016 04/14/2016 10/10/2015  HbA1c <5.7 % of total Hgb 5.6 - - - 5.7(H)  Chol <200 mg/dL 218(H) - - 209(H) 210(H)  HDL >50 mg/dL 76 - - 111 101  Calc LDL mg/dL (calc) 113(H) - - 87 85  Triglycerides <150 mg/dL 163(H) - - 56 121  Creatinine 0.50 - 1.05 mg/dL 0.82 0.77 0.73 0.85 0.74   BP/Weight 01/05/2018 12/24/2017 09/09/2017 03/17/2017 02/24/2017 12/09/2016 35/46/5681  Systolic BP 275 170 017 494 496 759 163  Diastolic BP 80 80 846 84 97 91 84  Wt. (Lbs) 202.8 197 200 206 185 190 -  BMI 34.81 33.81 35.43 35.36 32.77 32.61 -   No flowsheet data found.

## 2018-01-09 NOTE — Progress Notes (Signed)
[name]     MRN: [id number]      DOB: [birth year]   HPI [name] is here for follow up and re-evaluation of chronic medical conditions, medication management and review of any available recent lab and radiology data.  Preventive health is updated, specifically  Cancer screening and Immunization.   Questions or concerns regarding consultations or procedures which the PT has had in the interim are  addressed. The PT denies any adverse reactions to current medications since the last visit.  There are no new concerns.  There are no specific complaints   ROS Denies recent fever or chills. Denies sinus pressure, nasal congestion, ear pain or sore throat. Denies chest congestion, productive cough or wheezing. Denies chest pains, palpitations and leg swelling Denies abdominal pain, nausea, vomiting,diarrhea or constipation.   Denies dysuria, frequency, hesitancy or incontinence. Denies joint pain, swelling and limitation in mobility. Denies headaches, seizures, numbness, or tingling. Denies depression, anxiety or insomnia. Denies skin break down or rash.   PE  BP 124/80   Pulse 83   Temp 97.8 F (36.6 C)   Ht 5\' 4"  (1.626 m)   Wt 202 lb 12.8 oz (92 kg)   LMP 01/03/2018   SpO2 97%   BMI 34.81 kg/m   Patient alert and oriented and in no cardiopulmonary distress.  HEENT: No facial asymmetry, EOMI,   oropharynx pink and moist.  Neck supple no JVD, no mass.  Chest: Clear to auscultation bilaterally.  CVS: S1, S2 no murmurs, no S3.Regular rate.  ABD: Soft non tender.   Ext: No edema  MS: Adequate ROM spine, shoulders, hips and knees.  Skin: Intact,   Psych: Good eye contact, normal affect. Memory intact, tearful at times and  depressed appearing.  CNS: CN 2-12 intact, power,  normal throughout.no focal deficits noted.   Assessment & Plan  Essential hypertension Controlled, no change in medication DASH diet and commitment to daily physical activity for a  minimum of 30 minutes discussed and encouraged, as a part of hypertension management. The importance of attaining a healthy weight is also discussed.  BP/Weight 01/05/2018 12/24/2017 09/09/2017 03/17/2017 02/24/2017 12/09/2016 33/29/5188  Systolic BP 416 606 301 601 093 235 573  Diastolic BP 80 80 220 84 97 91 84  Wt. (Lbs) 202.8 197 200 206 185 190 -  BMI 34.81 33.81 35.43 35.36 32.77 32.61 -       Prediabetes Patient educated about the importance of limiting  Carbohydrate intake , the need to commit to daily physical activity for a minimum of 30 minutes , and to commit weight loss. The fact that changes in all these areas will reduce or eliminate all together the development of diabetes is stressed.  Resolved, which is great  Diabetic Labs Latest Ref Rng & Units 12/24/2017 12/09/2016 07/18/2016 04/14/2016 10/10/2015  HbA1c <5.7 % of total Hgb 5.6 - - - 5.7(H)  Chol <200 mg/dL 218(H) - - 209(H) 210(H)  HDL >50 mg/dL 76 - - 111 101  Calc LDL mg/dL (calc) 113(H) - - 87 85  Triglycerides <150 mg/dL 163(H) - - 56 121  Creatinine 0.50 - 1.05 mg/dL 0.82 0.77 0.73 0.85 0.74   BP/Weight 01/05/2018 12/24/2017 09/09/2017 03/17/2017 02/24/2017 12/09/2016 25/42/7062  Systolic BP 376 283 151 761 607 371 062  Diastolic BP 80 80 694 84 97 91 84  Wt. (Lbs) 202.8 197 200 206 185 190 -  BMI 34.81 33.81 35.43 35.36 32.77 32.61 -   No flowsheet data  found.    Hyperlipidemia LDL goal <100 Hyperlipidemia:Low fat diet discussed and encouraged.   Lipid Panel  Lab Results  Component Value Date   CHOL 218 (H) 12/24/2017   HDL 76 12/24/2017   LDLCALC 113 (H) 12/24/2017   TRIG 163 (H) 12/24/2017   CHOLHDL 2.9 12/24/2017   Uncontrolled, needs ot reduce fried and fatty foods    Eczema Currently controlled on medication, no flare  Severe persistent asthma Controlled, no change in medication   Obesity (BMI 30.0-34.9) Deteriorated. Patient re-educated about  the importance of commitment to a  minimum of  150 minutes of exercise per week.  The importance of healthy food choices with portion control discussed. Encouraged to start a food diary, count calories and to consider  joining a support group. Sample diet sheets offered. Goals set by the patient for the next several months.   Weight /BMI 01/05/2018 12/24/2017 09/09/2017  WEIGHT 202 lb 12.8 oz 197 lb 200 lb  HEIGHT 5\' 4"  5\' 4"  5\' 3"   BMI 34.81 kg/m2 33.81 kg/m2 35.43 kg/m2      Depression, major, single episode, moderate (HCC) New diagnosis, no known  Trigger, start effexor esp as c/o hot flashes , not suicidal or homicidal , no prior psych history, review in 8 weeks

## 2018-01-09 NOTE — Assessment & Plan Note (Signed)
New diagnosis, no known  Trigger, start effexor esp as c/o hot flashes , not suicidal or homicidal , no prior psych history, review in 8 weeks

## 2018-01-09 NOTE — Assessment & Plan Note (Signed)
Currently controlled on medication, no flare

## 2018-01-09 NOTE — Assessment & Plan Note (Signed)
Deteriorated. Patient re-educated about  the importance of commitment to a  minimum of 150 minutes of exercise per week.  The importance of healthy food choices with portion control discussed. Encouraged to start a food diary, count calories and to consider  joining a support group. Sample diet sheets offered. Goals set by the patient for the next several months.   Weight /BMI 01/05/2018 12/24/2017 09/09/2017  WEIGHT 202 lb 12.8 oz 197 lb 200 lb  HEIGHT 5\' 4"  5\' 4"  5\' 3"   BMI 34.81 kg/m2 33.81 kg/m2 35.43 kg/m2

## 2018-01-10 ENCOUNTER — Other Ambulatory Visit: Payer: Self-pay | Admitting: Family Medicine

## 2018-01-11 ENCOUNTER — Telehealth: Payer: Self-pay | Admitting: Family Medicine

## 2018-01-11 ENCOUNTER — Ambulatory Visit (HOSPITAL_COMMUNITY): Payer: Self-pay

## 2018-01-11 ENCOUNTER — Other Ambulatory Visit: Payer: Self-pay | Admitting: Family Medicine

## 2018-01-11 MED ORDER — PREDNISONE 20 MG PO TABS: ORAL_TABLET | ORAL | 0 refills | Status: DC

## 2018-01-11 NOTE — Progress Notes (Signed)
pre

## 2018-01-11 NOTE — Telephone Encounter (Signed)
Patient is calling requesting prednisone she said her eczema is blistered on the back of her neck and she is very uncomfortable. [facility] [phone]

## 2018-01-11 NOTE — Telephone Encounter (Signed)
Script sent and patient is aware for prednisone 20 mg twice daily for 5 days

## 2018-01-18 ENCOUNTER — Ambulatory Visit (INDEPENDENT_AMBULATORY_CARE_PROVIDER_SITE_OTHER): Payer: 59 | Admitting: Family Medicine

## 2018-01-18 ENCOUNTER — Other Ambulatory Visit (HOSPITAL_COMMUNITY)
Admission: AD | Admit: 2018-01-18 | Discharge: 2018-01-18 | Disposition: A | Discharge status: Home / Self Care | Payer: 59 | Source: Skilled Nursing Facility | Attending: Family Medicine | Admitting: Family Medicine

## 2018-01-18 ENCOUNTER — Encounter: Payer: Self-pay | Admitting: Family Medicine

## 2018-01-18 ENCOUNTER — Ambulatory Visit (HOSPITAL_COMMUNITY): Payer: Self-pay

## 2018-01-18 VITALS — BP 118/80 | HR 91 | Resp 16 | Ht 64.0 in | Wt 197.0 lb

## 2018-01-18 DIAGNOSIS — L03115 Cellulitis of right lower limb: Secondary | ICD-10-CM | POA: Diagnosis not present

## 2018-01-18 MED ORDER — CLINDAMYCIN HCL 300 MG PO CAPS: 300.0000 mg | ORAL_CAPSULE | Freq: Four times a day (QID) | ORAL | 0 refills | Status: DC

## 2018-01-18 MED ORDER — HYDROCODONE-ACETAMINOPHEN 7.5-325 MG PO TABS: 1.0000 | ORAL_TABLET | Freq: Four times a day (QID) | ORAL | 0 refills | Status: AC | PRN

## 2018-01-18 NOTE — Patient Instructions (Signed)
Rest Stay off feet Elevate leg Warm compresses Take antibiotic as directed Pain medicine as needed See me in 2 days Go to ER if worse, or running a fever

## 2018-01-18 NOTE — Progress Notes (Signed)
Chief Complaint  Patient presents with  . Leg Pain    right lower leg pain and redness. Started friday and the nurse at her job said she couldn't work and sent her home to be seen by doctor.   Usual PCP is Tula Nakayama, MD Patient is seen today for an acute visit She states that she has had a couple of insect bites on the right anterior leg.  These were moderately irritating.  Starting on Friday she noticed increasing redness, about the size of a quarter, around 1 of the bites.  Since yesterday she has had a rapidly progressive redness, swelling, and pain in the lower leg.  She went to the company nurse this morning who sent her home.  Insisted she see a doctor today.  Patient denies any fever chills malaise.  No known exposure to illness.  No prior problems with infection or circulation of the lower legs.  Patient is not diabetic.  She had a hemoglobin A1c done just last month that was normal.   Patient Active Problem List   Diagnosis Date Noted  . Depression, major, single episode, moderate ([place]) 01/09/2018  . Left carpal tunnel syndrome 03/18/2017  . Severe persistent asthma 04/15/2016  . Abnormal thyroid ultrasound 12/29/2014  . Hyperlipidemia LDL goal <100 12/22/2014  . Prediabetes 12/22/2014  . Baker's cyst 10/28/2011  . Menorrhagia with irregular cycle 05/22/2011  . PEPTIC ULCER DISEASE, HELICOBACTER PYLORI POSITIVE 11/12/2010  . Hypothyroidism 10/30/2010  . SCIATICA, LEFT 01/18/2010  . Eczema 12/31/2009  . Obesity (BMI 30.0-34.9) 11/02/2008  . Essential hypertension 11/02/2008  . Allergic rhinitis 11/02/2008  . DEGENERATIVE JOINT DISEASE, LEFT KNEE 11/02/2008    Outpatient Encounter Medications as of 01/18/2018  Medication Sig  . albuterol (PROVENTIL HFA;VENTOLIN HFA) 108 (90 Base) MCG/ACT inhaler Inhale 2 puffs into the lungs every 6 (six) hours as needed for wheezing or shortness of breath.  [name] albuterol (PROVENTIL) (2.5 MG/3ML) 0.083% nebulizer solution Take 3 mLs  (2.5 mg total) by nebulization every 4 (four) hours as needed for wheezing or shortness of breath.  [name] amLODipine (NORVASC) 10 MG tablet TAKE 1 TABLET (10 MG TOTAL) BY MOUTH DAILY.  . budesonide-formoterol (SYMBICORT) 160-4.5 MCG/ACT inhaler INHALE 2 PUFFS INTO THEL LUNGS 2 TIMES A DAY  . clindamycin (CLEOCIN) 300 MG capsule Take 1 capsule (300 mg total) by mouth 4 (four) times daily. Take with food  . Diphenhyd-Hydrocort-Nystatin (FIRST-DUKES MOUTHWASH) SUSP 10 cc  Three times daily , as needed,gargle swish and swallow for sore throat  . diphenhydrAMINE (BENADRYL) 25 mg capsule Take 1 capsule (25 mg total) by mouth every 6 (six) hours as needed. (Patient not taking: Reported on 01/05/2018)  . ergocalciferol (VITAMIN D2) 50000 units capsule Take 1 capsule (50,000 Units total) by mouth once a week. One capsule once weekly  . famotidine (PEPCID) 20 MG tablet Take 1 tablet (20 mg total) by mouth daily. (Patient not taking: Reported on 01/05/2018)  . fluticasone (FLONASE) 50 MCG/ACT nasal spray 1-2 every 12hours (Patient taking differently: Place 1 spray into both nostrils daily as needed for allergies. )  . HYDROcodone-acetaminophen (NORCO) 7.5-325 MG tablet Take 1 tablet by mouth every 6 (six) hours as needed for up to 5 days for moderate pain.  . hydrOXYzine (ATARAX/VISTARIL) 25 MG tablet TAKE 2 TO 3 TABLETS AT BEDTIME  . ipratropium-albuterol (DUONEB) 0.5-2.5 (3) MG/3ML SOLN USE ONE VIAL VIA NEBULIZER EVERY 8 HOURS AS NEEDED, FOR SEVERE WHEEZING AS DIRECTED  . montelukast (SINGULAIR) 10 MG  tablet TAKE 1 TABLET (10 MG TOTAL) BY MOUTH DAILY.  [name] tacrolimus (PROTOPIC) 0.1 % ointment APPLY TO FACE ONCE OR TWICE DAILY AS DIRECTED  . triamcinolone cream (KENALOG) 0.1 % APPLY TO AFFECTED AREA ON BODY (NOT FACE) TWICE DAILY FOR 1-2 WEEKS  . venlafaxine XR (EFFEXOR-XR) 37.5 MG 24 hr capsule Take 1 capsule (37.5 mg total) by mouth daily with breakfast.   No facility-administered encounter medications on file as  of 01/18/2018.     Allergies  Allergen Reactions  . Bactrim [Sulfamethoxazole-Trimethoprim] Other (See Comments)    Numbness, mouth pain, tightness in throat  . Penicillins     Has patient had a PCN reaction causing immediate rash, facial/tongue/throat swelling, SOB or lightheadedness with hypotension: Yes Has patient had a PCN reaction causing severe rash involving mucus membranes or skin necrosis: No Has patient had a PCN reaction that required hospitalization No Has patient had a PCN reaction occurring within the last 10 years: NoN  If all of the above answers are "NO", then may proceed with Cephalosporin use.     REACTION: At age 19 had heel sore and was put on R  . Latex Rash    Review of Systems  Constitutional: Negative for activity change, appetite change, chills and fever.  HENT: Negative for congestion and dental problem.   Eyes: Negative for photophobia and visual disturbance.  Respiratory: Negative for cough and shortness of breath.   Cardiovascular: Positive for leg swelling. Negative for chest pain.  Gastrointestinal: Negative for nausea and vomiting.  Musculoskeletal: Negative for arthralgias and back pain.  Skin: Positive for color change and rash.  Neurological: Negative for dizziness and headaches.    Physical Exam  Constitutional: She appears well-developed and well-nourished. No distress.  HENT:  Head: Normocephalic and atraumatic.  Mouth/Throat: Oropharynx is clear and moist.  Eyes: Pupils are equal, round, and reactive to light. Conjunctivae are normal.  Neck: Normal range of motion.  Cardiovascular: Normal rate, regular rhythm and normal heart sounds.  Pulmonary/Chest: Effort normal and breath sounds normal.  Musculoskeletal:       Legs: Lymphadenopathy:    She has no cervical adenopathy.  Skin: Rash noted.  Psychiatric: She has a normal mood and affect. Her behavior is normal.    BP 118/80   Pulse 91   Resp 16   Ht 5\' 4"  (1.626 m)   Wt 197  lb (89.4 kg)   LMP 01/03/2018   SpO2 96%   BMI 33.81 kg/m   @PHYSEXAM @  ASSESSMENT/PLAN:  1. Cellulitis of right lower extremity Concern for erysipelas with deep erythema, and rapidly advancing redness.  The nurse drew a black mark around the edge at 730 this morning, and by the time of her appointment there redness had advanced 2 cm beyond. - Wound culture Allergy to penicillin and sulfa.  I will choose clindamycin 300 mg 4 times daily, take with food.  Return 48 hours for follow-up.  Cautioned for any vomiting, inability to tolerate antibiotic, sweats chills or fever she should go to the ER  Patient Instructions  Rest Stay off feet Elevate leg Warm compresses Take antibiotic as directed Pain medicine as needed See me in 2 days Go to ER if worse, or running a fever   Raylene Everts, MD

## 2018-01-20 ENCOUNTER — Encounter: Payer: Self-pay | Admitting: Family Medicine

## 2018-01-20 ENCOUNTER — Other Ambulatory Visit: Payer: Self-pay

## 2018-01-20 ENCOUNTER — Ambulatory Visit (INDEPENDENT_AMBULATORY_CARE_PROVIDER_SITE_OTHER): Payer: 59 | Admitting: Family Medicine

## 2018-01-20 VITALS — BP 110/70 | HR 94 | Temp 98.7°F | Ht 64.0 in | Wt 194.1 lb

## 2018-01-20 DIAGNOSIS — L03115 Cellulitis of right lower limb: Secondary | ICD-10-CM | POA: Diagnosis not present

## 2018-01-20 DIAGNOSIS — L02415 Cutaneous abscess of right lower limb: Secondary | ICD-10-CM

## 2018-01-20 DIAGNOSIS — Z8614 Personal history of Methicillin resistant Staphylococcus aureus infection: Secondary | ICD-10-CM | POA: Diagnosis not present

## 2018-01-20 NOTE — Progress Notes (Signed)
Chief Complaint  Patient presents with  . cellulitis of right lower extremity    follow up  very sore   Patient is taking the clindamycin as directed.  She is taking hydrocodone at night because it makes her drowsy.  Her leg is still very sore.  The redness is gone down.  The swelling has increased.  There is no a "knot" on on her lower leg that is draining,  no fever or chills.  No nausea or vomiting.  Culture is growing moderate amount of staph aureus.  Sensitivities pending.  Review of the chart indicates that her last infection screening in 2011 was MRSA positive. Patient Active Problem List   Diagnosis Date Noted  . Depression, major, single episode, moderate ([place]) 01/09/2018  . Left carpal tunnel syndrome 03/18/2017  . Severe persistent asthma 04/15/2016  . Abnormal thyroid ultrasound 12/29/2014  . Hyperlipidemia LDL goal <100 12/22/2014  . Prediabetes 12/22/2014  . Baker's cyst 10/28/2011  . Menorrhagia with irregular cycle 05/22/2011  . PEPTIC ULCER DISEASE, HELICOBACTER PYLORI POSITIVE 11/12/2010  . Hypothyroidism 10/30/2010  . SCIATICA, LEFT 01/18/2010  . Eczema 12/31/2009  . Obesity (BMI 30.0-34.9) 11/02/2008  . Essential hypertension 11/02/2008  . Allergic rhinitis 11/02/2008  . DEGENERATIVE JOINT DISEASE, LEFT KNEE 11/02/2008    Outpatient Encounter Medications as of 01/20/2018  Medication Sig  . albuterol (PROVENTIL HFA;VENTOLIN HFA) 108 (90 Base) MCG/ACT inhaler Inhale 2 puffs into the lungs every 6 (six) hours as needed for wheezing or shortness of breath.  [name] albuterol (PROVENTIL) (2.5 MG/3ML) 0.083% nebulizer solution Take 3 mLs (2.5 mg total) by nebulization every 4 (four) hours as needed for wheezing or shortness of breath.  [name] amLODipine (NORVASC) 10 MG tablet TAKE 1 TABLET (10 MG TOTAL) BY MOUTH DAILY.  . budesonide-formoterol (SYMBICORT) 160-4.5 MCG/ACT inhaler INHALE 2 PUFFS INTO THEL LUNGS 2 TIMES A DAY  . clindamycin (CLEOCIN) 300 MG capsule Take 1  capsule (300 mg total) by mouth 4 (four) times daily. Take with food  . Diphenhyd-Hydrocort-Nystatin (FIRST-DUKES MOUTHWASH) SUSP 10 cc  Three times daily , as needed,gargle swish and swallow for sore throat  . diphenhydrAMINE (BENADRYL) 25 mg capsule Take 1 capsule (25 mg total) by mouth every 6 (six) hours as needed.  . ergocalciferol (VITAMIN D2) 50000 units capsule Take 1 capsule (50,000 Units total) by mouth once a week. One capsule once weekly  . famotidine (PEPCID) 20 MG tablet Take 1 tablet (20 mg total) by mouth daily.  . fluticasone (FLONASE) 50 MCG/ACT nasal spray 1-2 every 12hours (Patient taking differently: Place 1 spray into both nostrils daily as needed for allergies. )  . HYDROcodone-acetaminophen (NORCO) 7.5-325 MG tablet Take 1 tablet by mouth every 6 (six) hours as needed for up to 5 days for moderate pain.  . hydrOXYzine (ATARAX/VISTARIL) 25 MG tablet TAKE 2 TO 3 TABLETS AT BEDTIME  . ipratropium-albuterol (DUONEB) 0.5-2.5 (3) MG/3ML SOLN USE ONE VIAL VIA NEBULIZER EVERY 8 HOURS AS NEEDED, FOR SEVERE WHEEZING AS DIRECTED  . montelukast (SINGULAIR) 10 MG tablet TAKE 1 TABLET (10 MG TOTAL) BY MOUTH DAILY.  [name] tacrolimus (PROTOPIC) 0.1 % ointment APPLY TO FACE ONCE OR TWICE DAILY AS DIRECTED  . triamcinolone cream (KENALOG) 0.1 % APPLY TO AFFECTED AREA ON BODY (NOT FACE) TWICE DAILY FOR 1-2 WEEKS  . venlafaxine XR (EFFEXOR-XR) 37.5 MG 24 hr capsule Take 1 capsule (37.5 mg total) by mouth daily with breakfast.   No facility-administered encounter medications on file as of 01/20/2018.  Allergies  Allergen Reactions  . Bactrim [Sulfamethoxazole-Trimethoprim] Other (See Comments)    Numbness, mouth pain, tightness in throat  . Penicillins     Has patient had a PCN reaction causing immediate rash, facial/tongue/throat swelling, SOB or lightheadedness with hypotension: Yes Has patient had a PCN reaction causing severe rash involving mucus membranes or skin necrosis: No Has  patient had a PCN reaction that required hospitalization No Has patient had a PCN reaction occurring within the last 10 years: NoN  If all of the above answers are "NO", then may proceed with Cephalosporin use.     REACTION: At age 89 had heel sore and was put on R  . Latex Rash    Review of Systems  Constitutional: Negative for activity change, appetite change, chills and fever.  HENT: Negative for congestion and dental problem.   Eyes: Negative for photophobia and visual disturbance.  Respiratory: Negative for cough and shortness of breath.   Cardiovascular: Positive for leg swelling. Negative for chest pain.  Gastrointestinal: Negative for nausea and vomiting.  Musculoskeletal: Negative for arthralgias and back pain.  Skin: Positive for color change and rash.  Neurological: Negative for dizziness and headaches.    Physical Exam  Constitutional: She appears well-developed and well-nourished. No distress.  HENT:  Head: Normocephalic and atraumatic.  Mouth/Throat: Oropharynx is clear and moist.  Eyes: Pupils are equal, round, and reactive to light. Conjunctivae are normal.  Neck: Normal range of motion.  Cardiovascular: Normal rate, regular rhythm and normal heart sounds.  Pulmonary/Chest: Effort normal and breath sounds normal.  Musculoskeletal:       Legs: Lymphadenopathy:    She has no cervical adenopathy.  Skin: Rash noted.  Psychiatric: She has a normal mood and affect. Her behavior is normal.    BP 110/70   Pulse 94   Temp 98.7 F (37.1 C) (Oral)   Ht 5\' 4"  (1.626 m)   Wt 194 lb 1.9 oz (88.1 kg)   LMP 01/03/2018   SpO2 97%   BMI 33.32 kg/m   @PHYSEXAM @  ASSESSMENT/PLAN:  1. Abscess of right lower leg Positive for staph  2. Cellulitis of right lower extremity Improving  3. History of MRSA infection 2011 preoperative PCR screen   Patient Instructions  See me Fri or Mon    Raylene Everts, MD

## 2018-01-20 NOTE — Patient Instructions (Signed)
See me Fri or Mon

## 2018-01-21 LAB — AEROBIC CULTURE W GRAM STAIN (SUPERFICIAL SPECIMEN)
Gram Stain: NONE SEEN
Special Requests: NORMAL

## 2018-01-21 LAB — AEROBIC CULTURE  (SUPERFICIAL SPECIMEN)

## 2018-01-22 ENCOUNTER — Ambulatory Visit (INDEPENDENT_AMBULATORY_CARE_PROVIDER_SITE_OTHER): Payer: 59 | Admitting: Family Medicine

## 2018-01-22 ENCOUNTER — Encounter: Payer: Self-pay | Admitting: Family Medicine

## 2018-01-22 ENCOUNTER — Other Ambulatory Visit: Payer: Self-pay

## 2018-01-22 DIAGNOSIS — B9562 Methicillin resistant Staphylococcus aureus infection as the cause of diseases classified elsewhere: Secondary | ICD-10-CM

## 2018-01-22 DIAGNOSIS — L0390 Cellulitis, unspecified: Secondary | ICD-10-CM

## 2018-01-22 MED ORDER — CIPROFLOXACIN HCL 500 MG PO TABS: 500.0000 mg | ORAL_TABLET | Freq: Two times a day (BID) | ORAL | 0 refills | Status: DC

## 2018-01-22 MED ORDER — AMLODIPINE BESYLATE 10 MG PO TABS: ORAL_TABLET | ORAL | 1 refills | Status: DC

## 2018-01-22 MED ORDER — MONTELUKAST SODIUM 10 MG PO TABS: ORAL_TABLET | ORAL | 1 refills | Status: DC

## 2018-01-22 MED ORDER — FLUCONAZOLE 150 MG PO TABS: 150.0000 mg | ORAL_TABLET | Freq: Once | ORAL | 0 refills | Status: AC

## 2018-01-22 NOTE — Progress Notes (Signed)
Chief Complaint  Patient presents with  . abscess of lower leg    recheck, less painful,still red,   Patient is here for follow-up.  She is steadily improving.  The abscess on her leg is still draining.  Redness is gone down.  The pain is improved.  She can walk without limp. I discussed with her culture report.  She was positive for MRSA.  Sensitive to both clindamycin and Cipro.  She states the clindamycin is difficult for her at 4 times a day, and is causing diarrhea.  Is having a steady but slow response to the clindamycin.  We will switch her to Cipro for ease of taking compliance.  She states that she often gets yeast infections if she is placed on antibiotics.  She was therefore given a single Diflucan to use if needed.  Patient Active Problem List   Diagnosis Date Noted  . Cellulitis due to MRSA 01/22/2018  . Depression, major, single episode, moderate ([place]) 01/09/2018  . Left carpal tunnel syndrome 03/18/2017  . Severe persistent asthma 04/15/2016  . Abnormal thyroid ultrasound 12/29/2014  . Hyperlipidemia LDL goal <100 12/22/2014  . Prediabetes 12/22/2014  . Baker's cyst 10/28/2011  . Menorrhagia with irregular cycle 05/22/2011  . PEPTIC ULCER DISEASE, HELICOBACTER PYLORI POSITIVE 11/12/2010  . Hypothyroidism 10/30/2010  . SCIATICA, LEFT 01/18/2010  . Eczema 12/31/2009  . Obesity (BMI 30.0-34.9) 11/02/2008  . Essential hypertension 11/02/2008  . Allergic rhinitis 11/02/2008  . DEGENERATIVE JOINT DISEASE, LEFT KNEE 11/02/2008    Outpatient Encounter Medications as of 01/22/2018  Medication Sig  . amLODipine (NORVASC) 10 MG tablet TAKE 1 TABLET (10 MG TOTAL) BY MOUTH DAILY.  . budesonide-formoterol (SYMBICORT) 160-4.5 MCG/ACT inhaler INHALE 2 PUFFS INTO THEL LUNGS 2 TIMES A DAY  . ergocalciferol (VITAMIN D2) 50000 units capsule Take 1 capsule (50,000 Units total) by mouth once a week. One capsule once weekly  . HYDROcodone-acetaminophen (NORCO) 7.5-325 MG tablet Take 1  tablet by mouth every 6 (six) hours as needed for up to 5 days for moderate pain.  . hydrOXYzine (ATARAX/VISTARIL) 25 MG tablet TAKE 2 TO 3 TABLETS AT BEDTIME  . ipratropium-albuterol (DUONEB) 0.5-2.5 (3) MG/3ML SOLN USE ONE VIAL VIA NEBULIZER EVERY 8 HOURS AS NEEDED, FOR SEVERE WHEEZING AS DIRECTED  . montelukast (SINGULAIR) 10 MG tablet TAKE 1 TABLET (10 MG TOTAL) BY MOUTH DAILY.  [name] tacrolimus (PROTOPIC) 0.1 % ointment APPLY TO FACE ONCE OR TWICE DAILY AS DIRECTED  . triamcinolone cream (KENALOG) 0.1 % APPLY TO AFFECTED AREA ON BODY (NOT FACE) TWICE DAILY FOR 1-2 WEEKS  . venlafaxine XR (EFFEXOR-XR) 37.5 MG 24 hr capsule Take 1 capsule (37.5 mg total) by mouth daily with breakfast.  . [DISCONTINUED] amLODipine (NORVASC) 10 MG tablet TAKE 1 TABLET (10 MG TOTAL) BY MOUTH DAILY.  . [DISCONTINUED] clindamycin (CLEOCIN) 300 MG capsule Take 1 capsule (300 mg total) by mouth 4 (four) times daily. Take with food  . [DISCONTINUED] montelukast (SINGULAIR) 10 MG tablet TAKE 1 TABLET (10 MG TOTAL) BY MOUTH DAILY.  [name] albuterol (PROVENTIL HFA;VENTOLIN HFA) 108 (90 Base) MCG/ACT inhaler Inhale 2 puffs into the lungs every 6 (six) hours as needed for wheezing or shortness of breath. (Patient not taking: Reported on 01/22/2018)  . albuterol (PROVENTIL) (2.5 MG/3ML) 0.083% nebulizer solution Take 3 mLs (2.5 mg total) by nebulization every 4 (four) hours as needed for wheezing or shortness of breath. (Patient not taking: Reported on 01/22/2018)  . ciprofloxacin (CIPRO) 500 MG tablet Take 1  tablet (500 mg total) by mouth 2 (two) times daily.  . Diphenhyd-Hydrocort-Nystatin (FIRST-DUKES MOUTHWASH) SUSP 10 cc  Three times daily , as needed,gargle swish and swallow for sore throat (Patient not taking: Reported on 01/22/2018)  . diphenhydrAMINE (BENADRYL) 25 mg capsule Take 1 capsule (25 mg total) by mouth every 6 (six) hours as needed. (Patient not taking: Reported on 01/22/2018)  . famotidine (PEPCID) 20 MG tablet Take 1  tablet (20 mg total) by mouth daily. (Patient not taking: Reported on 01/22/2018)  . fluconazole (DIFLUCAN) 150 MG tablet Take 1 tablet (150 mg total) by mouth once for 1 dose.  . fluticasone (FLONASE) 50 MCG/ACT nasal spray 1-2 every 12hours (Patient not taking: Reported on 01/22/2018)   No facility-administered encounter medications on file as of 01/22/2018.     Allergies  Allergen Reactions  . Bactrim [Sulfamethoxazole-Trimethoprim] Other (See Comments)    Numbness, mouth pain, tightness in throat  . Penicillins     Has patient had a PCN reaction causing immediate rash, facial/tongue/throat swelling, SOB or lightheadedness with hypotension: Yes Has patient had a PCN reaction causing severe rash involving mucus membranes or skin necrosis: No Has patient had a PCN reaction that required hospitalization No Has patient had a PCN reaction occurring within the last 10 years: NoN  If all of the above answers are "NO", then may proceed with Cephalosporin use.     REACTION: At age 55 had heel sore and was put on R  . Latex Rash    Review of Systems  Constitutional: Negative for activity change, appetite change, chills and fever.  HENT: Negative for congestion and dental problem.   Eyes: Negative for photophobia and visual disturbance.  Respiratory: Negative for cough and shortness of breath.   Cardiovascular: Positive for leg swelling. Negative for chest pain.  Gastrointestinal: Negative for nausea and vomiting.  Musculoskeletal: Negative for arthralgias and back pain.  Skin: Positive for color change and rash.  Neurological: Negative for dizziness and headaches.    Physical Exam  Constitutional: She is oriented to person, place, and time. She appears well-developed and well-nourished. No distress.  HENT:  Head: Normocephalic and atraumatic.  Mouth/Throat: Oropharynx is clear and moist.  Eyes: Pupils are equal, round, and reactive to light. Conjunctivae are normal.    Musculoskeletal:       Legs: Neurological: She is alert and oriented to person, place, and time.  Skin: Rash noted.  Extensive eczema  Psychiatric: She has a normal mood and affect. Her behavior is normal. Thought content normal.    BP (!) 128/92   Pulse 90   Temp 98.8 F (37.1 C) (Oral)   Resp 14   Ht 5\' 4"  (1.626 m)   Wt 192 lb 1.3 oz (87.1 kg)   LMP 01/03/2018   SpO2 95%   BMI 32.97 kg/m   ASSESSMENT/PLAN:  1. Cellulitis due to MRSA Although she is responding to the clindamycin, the response is slow.  She is having some difficulty with the antibiotic.  The sensitivities show a good response to Cipro so she is being changed to Cipro 500 twice daily.  10-day supply.  She is also given a Diflucan if needed.  I will fill out forms for her to go back to her job on Tuesday.  She is told that if for any reason she feels unable to return to work on Tuesday, she can come see me Monday   Patient Instructions  Switch the antibiotic from clindamycin to cipro This is taken  2 x a day  Let me know if there are problems  Back to work Tuesday  Call for problems    Raylene Everts, MD

## 2018-01-22 NOTE — Patient Instructions (Signed)
Switch the antibiotic from clindamycin to cipro This is taken 2 x a day  Let me know if there are problems  Back to work Tuesday  Call for problems

## 2018-02-02 ENCOUNTER — Other Ambulatory Visit: Payer: Self-pay

## 2018-02-02 MED ORDER — VENLAFAXINE HCL ER 37.5 MG PO CP24: 37.5000 mg | ORAL_CAPSULE | Freq: Every day | ORAL | 1 refills | Status: DC

## 2018-02-05 ENCOUNTER — Other Ambulatory Visit: Payer: Self-pay | Admitting: Family Medicine

## 2018-02-23 ENCOUNTER — Other Ambulatory Visit: Payer: Self-pay | Admitting: Family Medicine

## 2018-03-03 ENCOUNTER — Other Ambulatory Visit: Payer: Self-pay | Admitting: Family Medicine

## 2018-03-10 IMAGING — CR DG CHEST 1V PORT
1 series · 1 of 1 positions shown · non-contrast
Comparison: Chest x-ray 01/11/2015 and 10/04/2014

CLINICAL DATA: Cough, wheezing for 3 days, history of asthma

EXAM:
PORTABLE CHEST 1 VIEW

[portable]
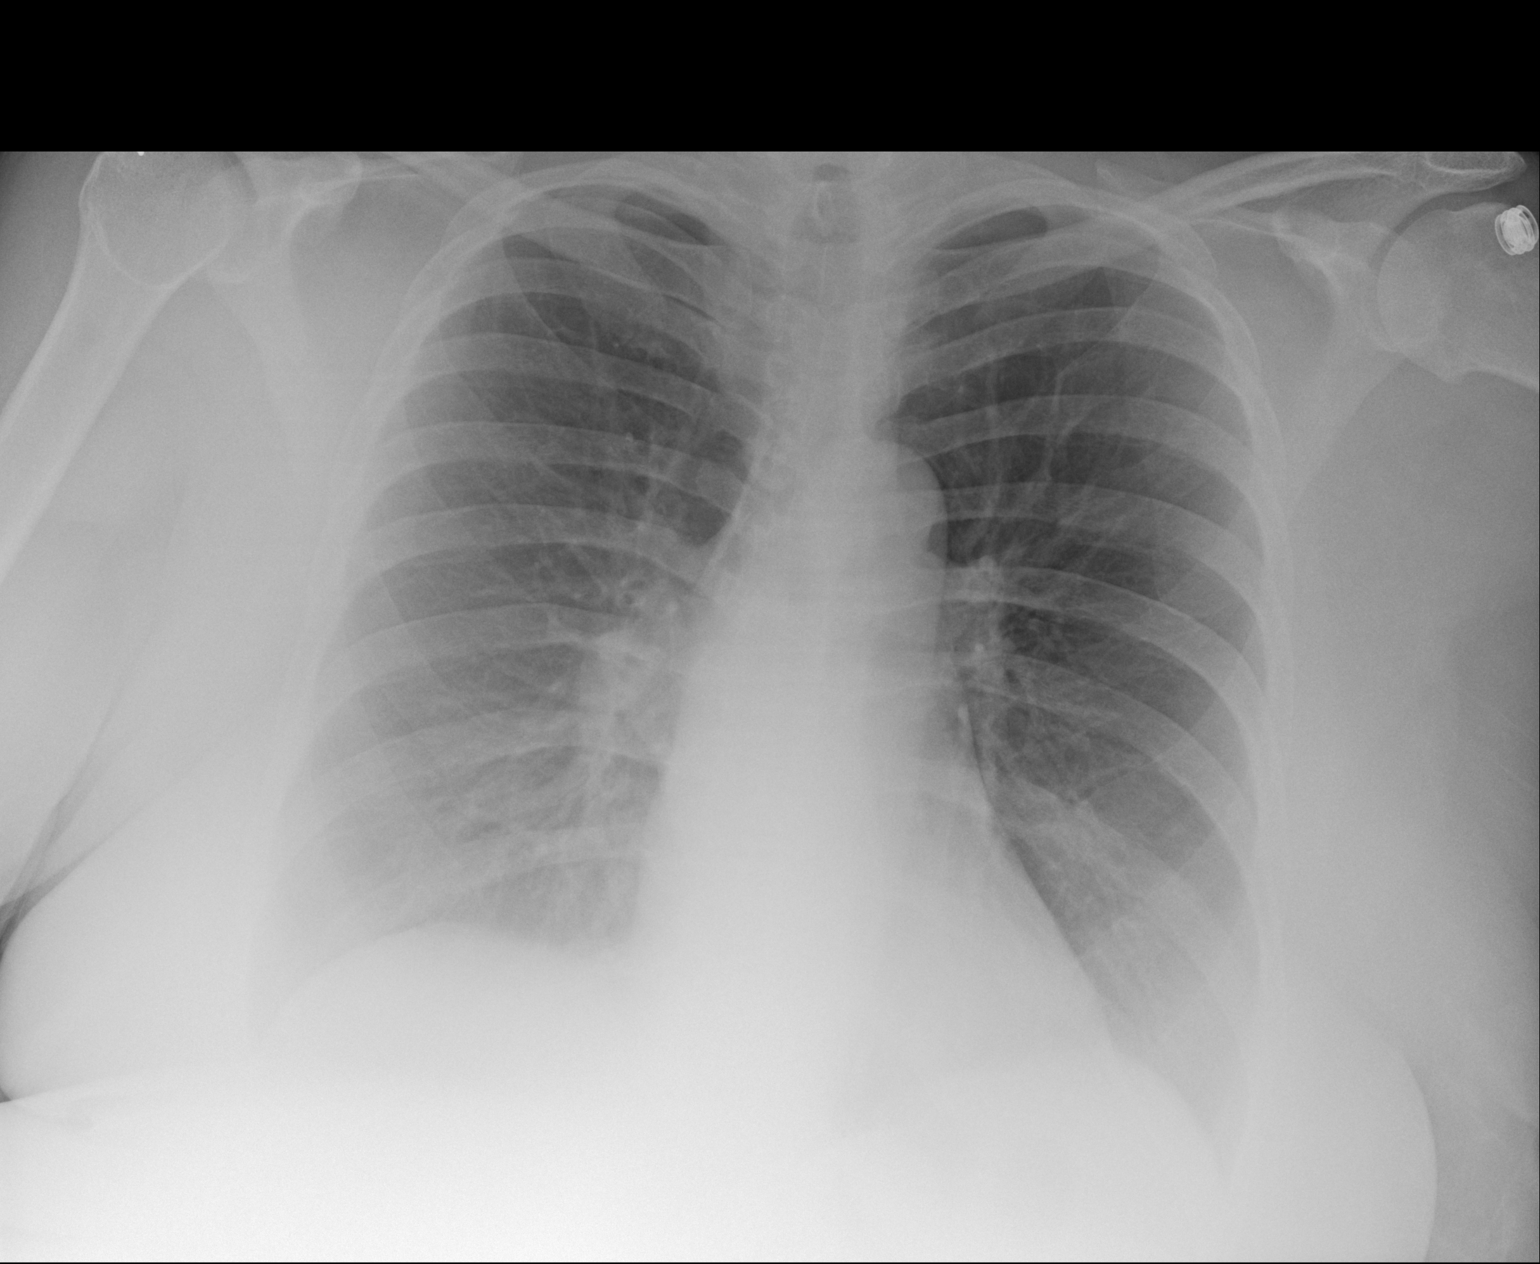

[1 of 1 positions shown; findings below may reference images not displayed]

FINDINGS: No active infiltrate or effusion is seen. Overlying breast tissue
creates haziness at the lung bases. Mediastinal and hilar contours
are unremarkable. The heart is within normal limits in size. No bony
abnormality is seen.
IMPRESSION: No active disease.

## 2018-03-15 ENCOUNTER — Ambulatory Visit: Payer: Self-pay | Admitting: Family Medicine

## 2018-03-29 ENCOUNTER — Encounter (HOSPITAL_COMMUNITY): Payer: Self-pay | Admitting: Emergency Medicine

## 2018-03-29 ENCOUNTER — Emergency Department (HOSPITAL_COMMUNITY)
Admission: EM | Admit: 2018-03-29 | Discharge: 2018-03-29 | Disposition: A | Discharge status: Home / Self Care | Payer: 59 | Attending: Emergency Medicine | Admitting: Emergency Medicine

## 2018-03-29 DIAGNOSIS — J45909 Unspecified asthma, uncomplicated: Secondary | ICD-10-CM | POA: Insufficient documentation

## 2018-03-29 DIAGNOSIS — Z91040 Latex allergy status: Secondary | ICD-10-CM | POA: Insufficient documentation

## 2018-03-29 DIAGNOSIS — E039 Hypothyroidism, unspecified: Secondary | ICD-10-CM | POA: Diagnosis not present

## 2018-03-29 DIAGNOSIS — L309 Dermatitis, unspecified: Secondary | ICD-10-CM | POA: Diagnosis not present

## 2018-03-29 DIAGNOSIS — T7840XA Allergy, unspecified, initial encounter: Secondary | ICD-10-CM | POA: Insufficient documentation

## 2018-03-29 DIAGNOSIS — Z79899 Other long term (current) drug therapy: Secondary | ICD-10-CM | POA: Insufficient documentation

## 2018-03-29 MED ORDER — DIPHENHYDRAMINE HCL 50 MG/ML IJ SOLN
25.0000 mg | Freq: Once | INTRAMUSCULAR | Status: AC
  Administered 2018-03-29: 25 mg via INTRAMUSCULAR
  Filled 2018-03-29: qty 1

## 2018-03-29 MED ORDER — DEXAMETHASONE SODIUM PHOSPHATE 4 MG/ML IJ SOLN
10.0000 mg | Freq: Once | INTRAMUSCULAR | Status: AC
  Administered 2018-03-29: 10 mg via INTRAMUSCULAR
  Filled 2018-03-29: qty 3

## 2018-03-29 MED ORDER — PREDNISONE 10 MG (21) PO TBPK: ORAL_TABLET | ORAL | 0 refills | Status: DC

## 2018-03-29 MED ORDER — LORATADINE 10 MG PO TABS: 10.0000 mg | ORAL_TABLET | Freq: Every day | ORAL | 0 refills | Status: DC

## 2018-03-29 NOTE — ED Provider Notes (Signed)
[facility] EMERGENCY DEPARTMENT Provider Note   CSN: [id number] Arrival date & time: 03/29/18  1249     History   Chief Complaint Chief Complaint  Patient presents with  . Allergic Reaction    HPI [name] is a 52 y.o. female.  HPI Patient presents to the emergency room for evaluation of facial swelling that started Saturday.  Patient has a history of eczema.  She has had some flares over the last month or so.  She has seen her dermatologist who put her on a course of antibiotics because he thought there was a bacterial superinfection.  Patient had been taking antihistamines.  She woke up on Saturday with the redness to her face and eyes.  She tried taking Benadryl but has not noticed any significant improvement.  Patient came to the emergency room for additional treatment.  She denies any difficulty swallowing or breathing.  No difficulty speaking.  She is not having any fevers or chills. Past Medical History:  Diagnosis Date  . Asthma 2010  . Eczema since childhoosd   flare since 2011  . Hypertension     Patient Active Problem List   Diagnosis Date Noted  . Cellulitis due to MRSA 01/22/2018  . Depression, major, single episode, moderate ([place]) 01/09/2018  . Left carpal tunnel syndrome 03/18/2017  . Severe persistent asthma 04/15/2016  . Abnormal thyroid ultrasound 12/29/2014  . Hyperlipidemia LDL goal <100 12/22/2014  . Prediabetes 12/22/2014  . Baker's cyst 10/28/2011  . Menorrhagia with irregular cycle 05/22/2011  . PEPTIC ULCER DISEASE, HELICOBACTER PYLORI POSITIVE 11/12/2010  . Hypothyroidism 10/30/2010  . SCIATICA, LEFT 01/18/2010  . Eczema 12/31/2009  . Obesity (BMI 30.0-34.9) 11/02/2008  . Essential hypertension 11/02/2008  . Allergic rhinitis 11/02/2008  . DEGENERATIVE JOINT DISEASE, LEFT KNEE 11/02/2008    Past Surgical History:  Procedure Laterality Date  . ANKLE SURGERY    . BACK SURGERY    . KNEE SURGERY    . TUBAL LIGATION       OB  History   None      Home Medications    Prior to Admission medications   Medication Sig Start Date End Date Taking? Authorizing Provider  albuterol (PROVENTIL HFA;VENTOLIN HFA) 108 (90 Base) MCG/ACT inhaler Inhale 2 puffs into the lungs every 6 (six) hours as needed for wheezing or shortness of breath. Patient not taking: Reported on 01/22/2018 05/21/16   Fayrene Helper, MD  albuterol (PROVENTIL) (2.5 MG/3ML) 0.083% nebulizer solution Take 3 mLs (2.5 mg total) by nebulization every 4 (four) hours as needed for wheezing or shortness of breath. Patient not taking: Reported on 01/22/2018 12/09/16   Francine Graven, DO  amLODipine (NORVASC) 10 MG tablet TAKE 1 TABLET (10 MG TOTAL) BY MOUTH DAILY. 01/22/18   Raylene Everts, MD  budesonide-formoterol [facility]) 160-4.5 MCG/ACT inhaler INHALE 2 PUFFS INTO THEL LUNGS 2 TIMES A DAY 11/30/17   Fayrene Helper, MD  ciprofloxacin (CIPRO) 500 MG tablet Take 1 tablet (500 mg total) by mouth 2 (two) times daily. 01/22/18   Raylene Everts, MD  Diphenhyd-Hydrocort-Nystatin (FIRST-DUKES MOUTHWASH) SUSP 10 cc  Three times daily , as needed,gargle swish and swallow for sore throat Patient not taking: Reported on 01/22/2018 12/24/17   Fayrene Helper, MD  diphenhydrAMINE (BENADRYL) 25 mg capsule Take 1 capsule (25 mg total) by mouth every 6 (six) hours as needed. Patient not taking: Reported on 01/22/2018 09/09/17   Fredia Sorrow, MD  ergocalciferol (VITAMIN D2) 50000 units capsule  Take 1 capsule (50,000 Units total) by mouth once a week. One capsule once weekly 01/05/18   Fayrene Helper, MD  famotidine (PEPCID) 20 MG tablet Take 1 tablet (20 mg total) by mouth daily. Patient not taking: Reported on 01/22/2018 07/18/16   Ezequiel Essex, MD  fluticasone [facility]) 50 MCG/ACT nasal spray 1-2 every 12hours Patient not taking: Reported on 01/22/2018 10/24/15   Tanda Rockers, MD  hydrOXYzine (ATARAX/VISTARIL) 25 MG tablet TAKE 2 TO 3 TABLETS AT  BEDTIME 02/24/18   Fayrene Helper, MD  ipratropium-albuterol (DUONEB) 0.5-2.5 (3) MG/3ML SOLN USE ONE VIAL VIA NEBULIZER EVERY 8 HOURS AS NEEDED, FOR SEVERE WHEEZING AS DIRECTED 03/09/15   Fayrene Helper, MD  loratadine (CLARITIN) 10 MG tablet Take 1 tablet (10 mg total) by mouth daily. 03/29/18   Dorie Rank, MD  montelukast (SINGULAIR) 10 MG tablet TAKE 1 TABLET (10 MG TOTAL) BY MOUTH DAILY. 01/22/18   Raylene Everts, MD  predniSONE (STERAPRED UNI-PAK 21 TAB) 10 MG (21) TBPK tablet Take 6 tabs by mouth daily  for 2 days, then 5 tabs for 2 days, then 4 tabs for 2 days, then 3 tabs for 2 days, 2 tabs for 2 days, then 1 tab by mouth daily for 2 days 03/29/18   Dorie Rank, MD  tacrolimus (PROTOPIC) 0.1 % ointment APPLY TO FACE ONCE OR TWICE DAILY AS DIRECTED 02/08/18   Fayrene Helper, MD  triamcinolone cream (KENALOG) 0.1 % APPLY TO AFFECTED AREA ON BODY (NOT FACE) TWICE DAILY FOR 1-2 WEEKS 03/03/18   Fayrene Helper, MD  venlafaxine XR (EFFEXOR-XR) 37.5 MG 24 hr capsule Take 1 capsule (37.5 mg total) by mouth daily with breakfast. 02/02/18   Fayrene Helper, MD    Family History Family History  Problem Relation Age of Onset  . Asthma Paternal Aunt   . Asthma Paternal Uncle     Social History Social History   Tobacco Use  . Smoking status: Never Smoker  . Smokeless tobacco: Never Used  Substance Use Topics  . Alcohol use: Yes    Comment: occassionally  . Drug use: No     Allergies   Bactrim [sulfamethoxazole-trimethoprim]; Penicillins; and Latex   Review of Systems Review of Systems  All other systems reviewed and are negative.    Physical Exam Updated Vital Signs BP (!) 132/103 (BP Location: Right Arm)   Pulse (!) 101   Temp 98.1 F (36.7 C) (Oral)   Resp 18   Ht 1.626 m (5\' 4" )   Wt 87.1 kg (192 lb)   LMP 03/15/2018   SpO2 100%   BMI 32.96 kg/m   Physical Exam  Constitutional: No distress.  HENT:  Head: Normocephalic and atraumatic.  Right  Ear: External ear normal.  Left Ear: External ear normal.  Eyes: Conjunctivae are normal. Right eye exhibits no discharge. Left eye exhibits no discharge. No scleral icterus.  Neck: Neck supple. No tracheal deviation present.  Cardiovascular: Normal rate, regular rhythm and intact distal pulses.  Pulmonary/Chest: Effort normal and breath sounds normal. No stridor. No respiratory distress. She has no wheezes. She has no rales.  Abdominal: Soft. Bowel sounds are normal. She exhibits no distension. There is no tenderness. There is no rebound and no guarding.  Musculoskeletal: She exhibits no edema or tenderness.  Neurological: She is alert. She has normal strength. No cranial nerve deficit (no facial droop, extraocular movements intact, no slurred speech) or sensory deficit. She exhibits normal muscle tone. She displays  no seizure activity. Coordination normal.  Skin: Skin is warm and dry. No rash noted. She is not diaphoretic.  Evidence of chronic inflammation and lichenification of the skin especially on her extremities, some evidence of chronic rash in the neck, acute erythema and edema in the face and periorbital region, and extends down to the neck, no pustules, no drainage  Psychiatric: She has a normal mood and affect.  Nursing note and vitals reviewed.    ED Treatments / Results  Labs (all labs ordered are listed, but only abnormal results are displayed) Labs Reviewed - No data to display  EKG None  Radiology No results found.  Procedures Procedures (including critical care time)  Medications Ordered in ED Medications  dexamethasone (DECADRON) injection 10 mg (has no administration in time range)  diphenhydrAMINE (BENADRYL) injection 25 mg (has no administration in time range)     Initial Impression / Assessment and Plan / ED Course  I have reviewed the triage vital signs and the nursing notes.  Pertinent labs & imaging results that were available during my care of the  patient were reviewed by me and considered in my medical decision making (see chart for details).   Patient symptoms are consistent with an allergic type dermatitis.  She does not have any evidence to suggest infection.  No involvement of her airway.  She was given a dose of I will have her continue that as an outpatient and recommend she follow-up with her dermatologist.  Final Clinical Impressions(s) / ED Diagnoses   Final diagnoses:  Allergic reaction, initial encounter  Eczema, unspecified type    ED Discharge Orders        Ordered    predniSONE (STERAPRED UNI-PAK 21 TAB) 10 MG (21) TBPK tablet     03/29/18 1401    loratadine (CLARITIN) 10 MG tablet  Daily     03/29/18 1401       Dorie Rank, MD 03/29/18 1402

## 2018-03-29 NOTE — ED Notes (Signed)
Patient given discharge instruction, verbalized understand. Patient ambulatory out of the department.  

## 2018-03-29 NOTE — ED Triage Notes (Signed)
Correction, pt has been on antibiotics for 4 weeks.

## 2018-03-29 NOTE — ED Triage Notes (Signed)
Pt reports being placed on antibiotics last week for eczema.  Last took Friday night.  Woke up on Saturday with redness to face and eyes swelling.  Has taken several doses of benadryl with no change.

## 2018-03-29 NOTE — Discharge Instructions (Addendum)
Take the medications as prescribed, follow-up with your dermatologist

## 2018-03-30 ENCOUNTER — Other Ambulatory Visit: Payer: Self-pay | Admitting: Family Medicine

## 2018-04-20 ENCOUNTER — Other Ambulatory Visit: Payer: Self-pay | Admitting: Family Medicine

## 2018-04-21 ENCOUNTER — Telehealth: Payer: Self-pay | Admitting: Family Medicine

## 2018-04-21 NOTE — Telephone Encounter (Signed)
RECEIVED PAPERWORK BY FAX. COPIED,SLEEVED, PLACED IN [employer] CHARGE FOLDER.

## 2018-04-26 ENCOUNTER — Telehealth: Payer: Self-pay | Admitting: Family Medicine

## 2018-04-26 ENCOUNTER — Other Ambulatory Visit: Payer: Self-pay | Admitting: Family Medicine

## 2018-04-26 NOTE — Telephone Encounter (Signed)
Paperwork from the [place] is reviewed and returned to you.not completed or signed Please  Contact the  pt.I cannot complete the form as she went to the ED on  one night. No follow up was advised from the ED visit, no mention made at the ED visit that she was to be out of work, she has not been in the office since the visit, so I have [place] supporting absence following that ED visit,  Yes she has eczema as a part of FMLA coverage, BUT to be out of work for a continued time and have it signed by me , she needs office evaluations to cover the period with statements supporting  the absence, there are none for that time

## 2018-04-27 ENCOUNTER — Telehealth: Payer: Self-pay | Admitting: Family Medicine

## 2018-04-27 NOTE — Telephone Encounter (Signed)
lmom Dr Moshe Cipro said she is unable to fill out the from she received from [facility].

## 2018-04-30 DIAGNOSIS — J4550 Severe persistent asthma, uncomplicated: Secondary | ICD-10-CM

## 2018-05-10 ENCOUNTER — Telehealth: Payer: Self-pay | Admitting: Family Medicine

## 2018-05-10 NOTE — Telephone Encounter (Signed)
Patient left a voicemail Friday 05/07/18 that questions #3 and #7 were not answered on her [facility] paperwork.

## 2018-05-10 NOTE — Telephone Encounter (Signed)
Completed per previous fmla and faxed back

## 2018-05-12 ENCOUNTER — Other Ambulatory Visit: Payer: Self-pay | Admitting: Family Medicine

## 2018-05-12 ENCOUNTER — Ambulatory Visit: Payer: Self-pay | Admitting: Family Medicine

## 2018-05-17 ENCOUNTER — Telehealth: Payer: Self-pay | Admitting: Family Medicine

## 2018-05-17 NOTE — Telephone Encounter (Signed)
Pt is coming by 8-6 to discuss FMLA papers, 2 questions need to be corrected and Dr Moshe Cipro initial the correction.

## 2018-05-17 NOTE — Telephone Encounter (Signed)
noted 

## 2018-05-18 ENCOUNTER — Ambulatory Visit: Payer: Self-pay | Admitting: Family Medicine

## 2018-05-27 ENCOUNTER — Ambulatory Visit: Payer: Self-pay | Admitting: Family Medicine

## 2018-06-24 ENCOUNTER — Other Ambulatory Visit: Payer: Self-pay

## 2018-06-24 MED ORDER — TRIAMCINOLONE ACETONIDE 0.1 % EX CREA: TOPICAL_CREAM | CUTANEOUS | 1 refills | Status: DC

## 2018-08-06 ENCOUNTER — Other Ambulatory Visit: Payer: Self-pay | Admitting: Family Medicine

## 2018-09-07 ENCOUNTER — Telehealth: Payer: Self-pay | Admitting: Family Medicine

## 2018-09-07 ENCOUNTER — Ambulatory Visit (INDEPENDENT_AMBULATORY_CARE_PROVIDER_SITE_OTHER): Payer: Self-pay | Admitting: Family Medicine

## 2018-09-07 ENCOUNTER — Other Ambulatory Visit: Payer: Self-pay

## 2018-09-07 ENCOUNTER — Encounter: Payer: Self-pay | Admitting: Family Medicine

## 2018-09-07 VITALS — BP 150/96 | HR 92 | Resp 15 | Ht 64.0 in | Wt 213.0 lb

## 2018-09-07 DIAGNOSIS — I10 Essential (primary) hypertension: Secondary | ICD-10-CM

## 2018-09-07 DIAGNOSIS — M545 Low back pain, unspecified: Secondary | ICD-10-CM

## 2018-09-07 DIAGNOSIS — Z23 Encounter for immunization: Secondary | ICD-10-CM

## 2018-09-07 DIAGNOSIS — J4550 Severe persistent asthma, uncomplicated: Secondary | ICD-10-CM

## 2018-09-07 MED ORDER — TACROLIMUS 0.1 % EX OINT: TOPICAL_OINTMENT | CUTANEOUS | 2 refills | Status: DC

## 2018-09-07 MED ORDER — VENLAFAXINE HCL ER 37.5 MG PO CP24: 37.5000 mg | ORAL_CAPSULE | Freq: Every day | ORAL | 1 refills | Status: DC

## 2018-09-07 MED ORDER — BUDESONIDE-FORMOTEROL FUMARATE 160-4.5 MCG/ACT IN AERO: INHALATION_SPRAY | RESPIRATORY_TRACT | 3 refills | Status: DC

## 2018-09-07 MED ORDER — CYCLOBENZAPRINE HCL 10 MG PO TABS: 10.0000 mg | ORAL_TABLET | Freq: Three times a day (TID) | ORAL | 0 refills | Status: DC | PRN

## 2018-09-07 MED ORDER — AMLODIPINE BESYLATE 10 MG PO TABS: 10.0000 mg | ORAL_TABLET | Freq: Every day | ORAL | 3 refills | Status: DC

## 2018-09-07 MED ORDER — KETOROLAC TROMETHAMINE 60 MG/2ML IM SOLN
60.0000 mg | Freq: Once | INTRAMUSCULAR | Status: AC
  Administered 2018-09-07: 60 mg via INTRAMUSCULAR

## 2018-09-07 MED ORDER — PREDNISONE 5 MG (21) PO TBPK: 5.0000 mg | ORAL_TABLET | ORAL | 0 refills | Status: DC

## 2018-09-07 MED ORDER — AMLODIPINE BESYLATE 10 MG PO TABS: ORAL_TABLET | ORAL | 1 refills | Status: DC

## 2018-09-07 MED ORDER — SPIRONOLACTONE 25 MG PO TABS: 25.0000 mg | ORAL_TABLET | Freq: Every day | ORAL | 3 refills | Status: DC

## 2018-09-07 NOTE — Telephone Encounter (Signed)
Pt aware and collected goodrx card and printed rxs

## 2018-09-07 NOTE — Patient Instructions (Addendum)
Nurse BP check in 7 to 9 days Flu vaccine today  MD follow up in 3 months.  Lipid, cmp and EGFR , HBA1C today Toradol 60 mg IM today for low back pain and take tylenol 500 mg every 6 hrs as needed, and a muscle relaxant is prescribed  Prednisone is prescribed, do not take until BP is rechecked normal  New additional medication for BP is spironolactone 25 mg one daily, continue amlodipine as before Increase vegetables, fruit and water, and work on weight loss, both will lower BP and avoid processed foods  Medication is sent for  Hot flashes

## 2018-09-07 NOTE — Telephone Encounter (Signed)
I recommend she try to cost compare getting 1 week at a time if needed, I suggest you print the scripts she needs also, she could try the good rx card  Needs the BP meds to get the job!

## 2018-09-07 NOTE — Telephone Encounter (Signed)
Pt called and said she cant get any medicine , her husband makes to much

## 2018-09-07 NOTE — Telephone Encounter (Signed)
pls see response 

## 2018-09-08 ENCOUNTER — Encounter: Payer: Self-pay | Admitting: Family Medicine

## 2018-09-08 NOTE — Progress Notes (Signed)
   [name]     MRN: [id number]      DOB: Dec 10, 1965   HPI [name] is here for follow up and re-evaluation of chronic medical conditions, medication management and review of any available recent lab and radiology data.  Preventive health is updated, specifically  Cancer screening and Immunization.   Currently applying for a new job, and needs Dr to evaluate and clear her because of her hypertension. She has been taking medication sporadically because of lack of insurance Reports improved skin and denies asthma flares  C/o low back pain non radiating with no specific aggravating factor, started in the last several weeks n red flag symptoms  ROS Denies recent fever or chills. Denies sinus pressure, nasal congestion, ear pain or sore throat. Denies chest congestion, productive cough or wheezing. Denies chest pains, palpitations and leg swelling Denies abdominal pain, nausea, vomiting,diarrhea or constipation.   Denies dysuria, frequency, hesitancy or incontinence.  Denies headaches, seizures, numbness, or tingling. Denies depression, anxiety or insomnia. Denies skin break down or rash.   PE  BP (!) 150/96   Pulse 92   Resp 15   Ht 5\' 4"  (1.626 m)   Wt 213 lb (96.6 kg)   SpO2 97%   BMI 36.56 kg/m   Patient alert and oriented and in no cardiopulmonary distress.  HEENT: No facial asymmetry, EOMI,   oropharynx pink and moist.  Neck supple no JVD, no mass.  Chest: Clear to auscultation bilaterally.  CVS: S1, S2 no murmurs, no S3.Regular rate.  ABD: Soft non tender.   Ext: No edema  MS: Adequate ROM spine, shoulders, hips and knees.  Skin: Intact, no ulcerations or rash noted.  Psych: Good eye contact, normal affect. Memory intact not anxious or depressed appearing.  CNS: CN 2-12 intact, power,  normal throughout.no focal deficits noted.   Assessment & Plan  Essential hypertension Uncontrolled , add spironolactone 25 mg daily DASH diet and commitment to daily  physical activity for a minimum of 30 minutes discussed and encouraged, as a part of hypertension management. The importance of attaining a healthy weight is also discussed.  BP/Weight 09/07/2018 03/29/2018 01/22/2018 01/20/2018 01/18/2018 01/05/2018 05/07/3663  Systolic BP 403 474 259 563 [phone]  Diastolic BP 96 99 92 70 80 80 80  Wt. (Lbs) 213 192 192.08 194.12 197 202.8 197  BMI 36.56 32.96 32.97 33.32 33.81 34.81 33.81       Low back pain Increased and uncontrolled localized pain, non radiating x 10 days Toradol 60 mg IM , muscle relaxant and tylenol Prednisone dose pack to be filled after re check BP   Severe persistent asthma Reports marked symptom impprovement  Morbid obesity (HCC) Deteriorated. Patient re-educated about  the importance of commitment to a  minimum of 150 minutes of exercise per week.  The importance of healthy food choices with portion control discussed. Encouraged to start a food diary, count calories and to consider  joining a support group. Sample diet sheets offered. Goals set by the patient for the next several months.   Weight /BMI 09/07/2018 03/29/2018 01/22/2018  WEIGHT 213 lb 192 lb 192 lb 1.3 oz  HEIGHT 5\' 4"  5\' 4"  5\' 4"   BMI 36.56 kg/m2 32.96 kg/m2 32.97 kg/m2

## 2018-09-08 NOTE — Assessment & Plan Note (Signed)
Reports marked symptom impprovement

## 2018-09-08 NOTE — Assessment & Plan Note (Signed)
Increased and uncontrolled localized pain, non radiating x 10 days Toradol 60 mg IM , muscle relaxant and tylenol Prednisone dose pack to be filled after re check BP

## 2018-09-08 NOTE — Assessment & Plan Note (Signed)
Deteriorated. Patient re-educated about  the importance of commitment to a  minimum of 150 minutes of exercise per week.  The importance of healthy food choices with portion control discussed. Encouraged to start a food diary, count calories and to consider  joining a support group. Sample diet sheets offered. Goals set by the patient for the next several months.   Weight /BMI 09/07/2018 03/29/2018 01/22/2018  WEIGHT 213 lb 192 lb 192 lb 1.3 oz  HEIGHT 5\' 4"  5\' 4"  5\' 4"   BMI 36.56 kg/m2 32.96 kg/m2 32.97 kg/m2

## 2018-09-08 NOTE — Assessment & Plan Note (Signed)
Uncontrolled , add spironolactone 25 mg daily DASH diet and commitment to daily physical activity for a minimum of 30 minutes discussed and encouraged, as a part of hypertension management. The importance of attaining a healthy weight is also discussed.  BP/Weight 09/07/2018 03/29/2018 01/22/2018 01/20/2018 01/18/2018 01/05/2018 2/55/2589  Systolic BP 483 475 830 [phone]  Diastolic BP 96 99 92 70 80 80 80  Wt. (Lbs) 213 192 192.08 194.12 197 202.8 197  BMI 36.56 32.96 32.97 33.32 33.81 34.81 33.81

## 2018-09-15 ENCOUNTER — Ambulatory Visit: Payer: Medicaid Other

## 2018-09-15 VITALS — BP 110/80

## 2018-09-15 DIAGNOSIS — I10 Essential (primary) hypertension: Secondary | ICD-10-CM

## 2018-09-15 NOTE — Progress Notes (Signed)
BP check was within normal limits and advised to continue current meds

## 2018-10-22 ENCOUNTER — Other Ambulatory Visit: Payer: Self-pay | Admitting: Family Medicine

## 2018-10-25 ENCOUNTER — Other Ambulatory Visit: Payer: Self-pay

## 2018-10-25 MED ORDER — MONTELUKAST SODIUM 10 MG PO TABS: ORAL_TABLET | ORAL | 1 refills | Status: DC

## 2019-01-07 ENCOUNTER — Other Ambulatory Visit: Payer: Self-pay

## 2019-01-07 ENCOUNTER — Telehealth: Payer: Self-pay

## 2019-01-07 MED ORDER — VENLAFAXINE HCL ER 37.5 MG PO CP24: 37.5000 mg | ORAL_CAPSULE | Freq: Every day | ORAL | 0 refills | Status: DC

## 2019-01-07 NOTE — Telephone Encounter (Signed)
Patient called in stating that Dr.Simpson had prescribed Effexor last year but she lost her job and wasn't able to pick it up but now she was working again. Asked to have the med refilled. Refill sent into [facility]

## 2019-04-05 ENCOUNTER — Encounter: Payer: Self-pay | Admitting: Family Medicine

## 2019-04-05 ENCOUNTER — Other Ambulatory Visit: Payer: Self-pay

## 2019-04-05 ENCOUNTER — Ambulatory Visit (INDEPENDENT_AMBULATORY_CARE_PROVIDER_SITE_OTHER): Payer: Self-pay | Admitting: Family Medicine

## 2019-04-05 VITALS — BP 110/80 | Ht 64.0 in | Wt 190.0 lb

## 2019-04-05 DIAGNOSIS — E038 Other specified hypothyroidism: Secondary | ICD-10-CM

## 2019-04-05 DIAGNOSIS — R7303 Prediabetes: Secondary | ICD-10-CM

## 2019-04-05 DIAGNOSIS — N920 Excessive and frequent menstruation with regular cycle: Secondary | ICD-10-CM

## 2019-04-05 DIAGNOSIS — E785 Hyperlipidemia, unspecified: Secondary | ICD-10-CM

## 2019-04-05 DIAGNOSIS — F321 Major depressive disorder, single episode, moderate: Secondary | ICD-10-CM

## 2019-04-05 DIAGNOSIS — J4550 Severe persistent asthma, uncomplicated: Secondary | ICD-10-CM

## 2019-04-05 DIAGNOSIS — I10 Essential (primary) hypertension: Secondary | ICD-10-CM

## 2019-04-05 DIAGNOSIS — Z1321 Encounter for screening for nutritional disorder: Secondary | ICD-10-CM

## 2019-04-05 MED ORDER — MONTELUKAST SODIUM 10 MG PO TABS: ORAL_TABLET | ORAL | 1 refills | Status: DC

## 2019-04-05 MED ORDER — ALBUTEROL SULFATE HFA 108 (90 BASE) MCG/ACT IN AERS: 2.0000 | INHALATION_SPRAY | Freq: Four times a day (QID) | RESPIRATORY_TRACT | 3 refills | Status: DC | PRN

## 2019-04-05 MED ORDER — LORATADINE 10 MG PO TABS: 10.0000 mg | ORAL_TABLET | Freq: Every day | ORAL | 1 refills | Status: DC

## 2019-04-05 MED ORDER — MEDROXYPROGESTERONE ACETATE 10 MG PO TABS: ORAL_TABLET | ORAL | 0 refills | Status: DC

## 2019-04-05 MED ORDER — TRIAMCINOLONE ACETONIDE 0.1 % EX CREA: TOPICAL_CREAM | CUTANEOUS | 3 refills | Status: DC

## 2019-04-05 MED ORDER — HYDROXYZINE HCL 25 MG PO TABS: ORAL_TABLET | ORAL | 1 refills | Status: DC

## 2019-04-05 MED ORDER — AMLODIPINE BESYLATE 10 MG PO TABS: 10.0000 mg | ORAL_TABLET | Freq: Every day | ORAL | 1 refills | Status: DC

## 2019-04-05 MED ORDER — TACROLIMUS 0.1 % EX OINT: TOPICAL_OINTMENT | CUTANEOUS | 3 refills | Status: DC

## 2019-04-05 NOTE — Progress Notes (Signed)
Virtual Visit via Telephone Note  I connected with [name] on 04/05/19 at  1:00 PM EDT by telephone and verified that I am speaking with the correct person using two identifiers.  Location: Patient: home Provider: Office    I discussed the limitations, risks, security and privacy concerns of performing an evaluation and management service by telephone and the availability of in person appointments. I also discussed with the patient that there may be a patient responsible charge related to this service. The patient expressed understanding and agreed to proceed. I connected with  [name] on 04/05/19 by a video enabled telemedicine application and verified that I am speaking with the correct person using two identifiers.   I discussed the limitations of evaluation and management by telemedicine. The patient expressed understanding and agreed to proceed.     History of Present Illness: F/U chronic problems Has been having cycles every month lasting for 3 to 4 days, however in June started on 9th and is still going on, the recent 5 have been very heavy, clots, flooding, needs overnight pads 3/day Denies recent fever or chills. Denies sinus pressure, nasal congestion, ear pain or sore throat. Denies chest congestion, productive cough or wheezing. Denies chest pains, palpitations and leg swelling Denies abdominal pain, nausea, vomiting,diarrhea or constipation.   Denies dysuria, frequency, hesitancy or incontinence. Denies joint pain, swelling and limitation in mobility. Denies headaches, seizures, numbness, or tingling. Denies depression, anxiety or insomnia. Denies uncontrolled skin  break down or rash.       Observations/Objective:  BP 110/80   Ht 5\' 4"  (1.626 m)   Wt 190 lb (86.2 kg)   BMI 32.61 kg/m  Good communication with no confusion and intact memory. Alert and oriented x 3 No signs of respiratory distress during speech   Assessment and Plan: Menorrhagia  with regular cycle 3 week h/o menstrual bleeding , generally has a regular bleed, will prescribe provera short term and hopefully this will stabilize her endometrium, likely perimenopausal irregularity  Severe persistent asthma Controlled on current meds, denies recent flare   Essential hypertension Controlled, no change in medication DASH diet and commitment to daily physical activity for a minimum of 30 minutes discussed and encouraged, as a part of hypertension management. The importance of attaining a healthy weight is also discussed.  BP/Weight 04/05/2019 09/15/2018 09/07/2018 03/29/2018 01/22/2018 05/26/4817 02/16/3148  Systolic BP 702 637 858 850 277 412 878  Diastolic BP 80 80 96 99 92 70 80  Wt. (Lbs) 190 - 213 192 192.08 194.12 197  BMI 32.61 - 36.56 32.96 32.97 33.32 33.81       Depression, major, single episode, moderate (HCC) Controlled, no change in medication   Hypothyroidism Updated lab needed at/ before next visit.     Follow Up Instructions:    I discussed the assessment and treatment plan with the patient. The patient was provided an opportunity to ask questions and all were answered. The patient agreed with the plan and demonstrated an understanding of the instructions.   The patient was advised to call back or seek an in-person evaluation if the symptoms worsen or if the condition fails to improve as anticipated.  I provided 22 minutes of non-face-to-face time during this encounter.   Tula Nakayama, MD

## 2019-04-05 NOTE — Patient Instructions (Addendum)
Physical exam with pap July 13 or 14  Mammogram to be scheduled July 3 , 13 or 14 if possible please  Please get labs fasting as soon as possible, overdue! CBC, lipid , cmp and eGFr , TSH, hBA1C and vit D  Progesterone tablets are prescribed for 1 week only to help to stop your heavy bleeding  It is important that you exercise regularly at least 30 minutes 5 times a week. If you develop chest pain, have severe difficulty breathing, or feel very tired, stop exercising immediately and seek medical attention    Think about what you will eat, plan ahead. Choose " clean, green, fresh or frozen" over canned, processed or packaged foods which are more sugary, salty and fatty. 70 to 75% of food eaten should be vegetables and fruit. Three meals at set times with snacks allowed between meals, but they must be fruit or vegetables. Aim to eat over a 12 hour period , example 7 am to 7 pm, and STOP after  your last meal of the day. Drink water,generally about 64 ounces per day, no other drink is as healthy. Fruit juice is best enjoyed in a healthy way, by EATING the fruit. Social distancing. Frequent hand washing with soap and water Keeping your hands off of your face. These 3 practices will help to keep both you and your community healthy during this time. Please practice them faithfully! Thanks for choosing [facility], we consider it a privelige to serve you.

## 2019-04-06 ENCOUNTER — Other Ambulatory Visit (HOSPITAL_COMMUNITY): Payer: Self-pay | Admitting: Family Medicine

## 2019-04-06 DIAGNOSIS — Z1231 Encounter for screening mammogram for malignant neoplasm of breast: Secondary | ICD-10-CM

## 2019-04-09 ENCOUNTER — Other Ambulatory Visit: Payer: Self-pay | Admitting: Family Medicine

## 2019-04-10 ENCOUNTER — Encounter: Payer: Self-pay | Admitting: Family Medicine

## 2019-04-10 DIAGNOSIS — N920 Excessive and frequent menstruation with regular cycle: Secondary | ICD-10-CM | POA: Insufficient documentation

## 2019-04-10 NOTE — Assessment & Plan Note (Signed)
Updated lab needed at/ before next visit.   

## 2019-04-10 NOTE — Assessment & Plan Note (Signed)
Controlled, no change in medication  

## 2019-04-10 NOTE — Assessment & Plan Note (Signed)
Controlled, no change in medication DASH diet and commitment to daily physical activity for a minimum of 30 minutes discussed and encouraged, as a part of hypertension management. The importance of attaining a healthy weight is also discussed.  BP/Weight 04/05/2019 09/15/2018 09/07/2018 03/29/2018 01/22/2018 12/12/4994 06/15/4931  Systolic BP 419 914 445 848 350 757 322  Diastolic BP 80 80 96 99 92 70 80  Wt. (Lbs) 190 - 213 192 192.08 194.12 197  BMI 32.61 - 36.56 32.96 32.97 33.32 33.81

## 2019-04-10 NOTE — Assessment & Plan Note (Signed)
3 week h/o menstrual bleeding , generally has a regular bleed, will prescribe provera short term and hopefully this will stabilize her endometrium, likely perimenopausal irregularity

## 2019-04-10 NOTE — Assessment & Plan Note (Signed)
Controlled on current meds, denies recent flare

## 2019-04-25 ENCOUNTER — Other Ambulatory Visit: Payer: Self-pay | Admitting: Family Medicine

## 2019-04-25 ENCOUNTER — Telehealth: Payer: Self-pay | Admitting: Family Medicine

## 2019-04-25 ENCOUNTER — Ambulatory Visit (HOSPITAL_COMMUNITY): Payer: Medicaid Other

## 2019-04-25 MED ORDER — MEDROXYPROGESTERONE ACETATE 10 MG PO TABS: ORAL_TABLET | ORAL | 0 refills | Status: DC

## 2019-04-25 NOTE — Progress Notes (Unsigned)
Provera 10

## 2019-04-25 NOTE — Telephone Encounter (Signed)
Provera is prescribed and pt is aware

## 2019-04-25 NOTE — Telephone Encounter (Signed)
The medication you sent in for the PT worked she stopped her menstrual for a couple\days , then she started back on Sunday, wants to know if you will call her in that same medication again

## 2019-04-28 ENCOUNTER — Encounter: Payer: Medicaid Other | Admitting: Family Medicine

## 2019-05-04 ENCOUNTER — Encounter: Payer: Medicaid Other | Admitting: Family Medicine

## 2019-05-08 ENCOUNTER — Other Ambulatory Visit: Payer: Self-pay | Admitting: Family Medicine

## 2019-05-11 ENCOUNTER — Other Ambulatory Visit (HOSPITAL_COMMUNITY)
Admission: RE | Admit: 2019-05-11 | Discharge: 2019-05-11 | Disposition: A | Discharge status: Home / Self Care | Payer: BC Managed Care – PPO | Source: Ambulatory Visit | Attending: Family Medicine | Admitting: Family Medicine

## 2019-05-11 ENCOUNTER — Other Ambulatory Visit: Payer: Self-pay

## 2019-05-11 ENCOUNTER — Encounter: Payer: Self-pay | Admitting: Family Medicine

## 2019-05-11 ENCOUNTER — Ambulatory Visit (INDEPENDENT_AMBULATORY_CARE_PROVIDER_SITE_OTHER): Payer: BC Managed Care – PPO | Admitting: Family Medicine

## 2019-05-11 VITALS — BP 132/84 | HR 80 | Temp 97.0°F | Ht 64.0 in | Wt 219.0 lb

## 2019-05-11 DIAGNOSIS — Z0000 Encounter for general adult medical examination without abnormal findings: Secondary | ICD-10-CM | POA: Insufficient documentation

## 2019-05-11 DIAGNOSIS — N924 Excessive bleeding in the premenopausal period: Secondary | ICD-10-CM | POA: Diagnosis not present

## 2019-05-11 DIAGNOSIS — N920 Excessive and frequent menstruation with regular cycle: Secondary | ICD-10-CM

## 2019-05-11 DIAGNOSIS — Z1211 Encounter for screening for malignant neoplasm of colon: Secondary | ICD-10-CM

## 2019-05-11 DIAGNOSIS — Z124 Encounter for screening for malignant neoplasm of cervix: Secondary | ICD-10-CM

## 2019-05-11 MED ORDER — NORGESTIMATE-ETH ESTRADIOL 0.25-35 MG-MCG PO TABS: 1.0000 | ORAL_TABLET | Freq: Every day | ORAL | 2 refills | Status: DC

## 2019-05-11 NOTE — Assessment & Plan Note (Signed)

## 2019-05-11 NOTE — Progress Notes (Signed)
    [name]     MRN: [id number]      DOB: 1966/06/26  HPI: Patient is in for annual physical exam. New onset excessive and prolonged periods lasting for 21 days and more, currently on 2nd round of provera, will rx s 2 cycles of OCP and urgent gyne referral Immunization is reviewed , and  Is up to date. Referred for colonoscopy    PE: BP 132/84   Pulse 80   Temp (!) 97 F (36.1 C) (Temporal)   Ht 5\' 4"  (1.626 m)   Wt 219 lb (99.3 kg)   SpO2 98%   BMI 37.59 kg/m   Pleasant  female, alert and oriented x 3, in no cardio-pulmonary distress. Afebrile. HEENT No facial trauma or asymetry. Sinuses non tender.  Extra occullar muscles intact,  Neck: supple, no adenopathy,JVD or thyromegaly.No bruits.  Chest: Clear to ascultation bilaterally.No crackles or wheezes. Non tender to palpation  Breast: No asymetry,no masses or lumps. No tenderness. No nipple discharge or inversion. No axillary or supraclavicular adenopathy  Cardiovascular system; Heart sounds normal,  S1 and  S2 ,no S3.  No murmur, or thrill. Apical beat not displaced Peripheral pulses normal.  Abdomen: Soft, non tender, no organomegaly or masses. No bruits. Bowel sounds normal. No guarding, tenderness or rebound.  GU: External genitalia normal female genitalia , normal female distribution of hair. No lesions. Urethral meatus normal in size, no  Prolapse, no lesions visibly  Present. Bladder non tender. Vagina pink and moist , with no visible lesions , discharge present . Adequate pelvic support no  cystocele or rectocele noted Cervix pink and appears healthy, no lesions or ulcerations noted, no discharge noted from os Uterus normal size, no adnexal masses, no cervical motion or adnexal tenderness.   Musculoskeletal exam: Full ROM of spine, hips , shoulders and knees. No deformity ,swelling or crepitus noted. No muscle wasting or atrophy.   Neurologic: Cranial nerves 2 to 12 intact. Power, tone  ,sensation and reflexes normal throughout. No disturbance in gait. No tremor.  Skin: Intact,keloids and erythematous scars ,scattered , worse in  LUE Pigmentation normal throughout  Psych; Normal mood and affect. Judgement and concentration normal   Assessment & Plan:  Annual physical exam Annual exam as documented. Counseling done  re healthy lifestyle involving commitment to 150 minutes exercise per week, heart healthy diet, and attaining healthy weight.The importance of adequate sleep also discussed. Regular seat belt use and home safety, is also discussed. Changes in health habits are decided on by the patient with goals and time frames  set for achieving them. Immunization and cancer screening needs are specifically addressed at this visit.   Menorrhagia with regular cycle Approx 3 month h/o excessive and prolonged menses in the perimenopausal period, will refer to gyne

## 2019-05-11 NOTE — Assessment & Plan Note (Signed)
Approx 3 month h/o excessive and prolonged menses in the perimenopausal period, will refer to gyne

## 2019-05-11 NOTE — Patient Instructions (Addendum)
Follow-up with MD in 6 months call if you need me sooner.  Fasting labs today.  Mammogram next week as scheduled.  Pap sent today and you will be notified of results.  2 cycles of birth control pills are sent in to help to control bleeding until you see gynecology.  I am referring you to gynecology as well as for your screening colonoscopy   It is important that you exercise regularly at least 30 minutes 5 times a week. If you develop chest pain, have severe difficulty breathing, or feel very tired, stop exercising immediately and seek medical attention  Think about what you will eat, plan ahead. Choose " clean, green, fresh or frozen" over canned, processed or packaged foods which are more sugary, salty and fatty. 70 to 75% of food eaten should be vegetables and fruit. Three meals at set times with snacks allowed between meals, but they must be fruit or vegetables. Aim to eat over a 12 hour period , example 7 am to 7 pm, and STOP after  your last meal of the day. Drink water,generally about 64 ounces per day, no other drink is as healthy. Fruit juice is best enjoyed in a healthy way, by EATING the fruit. Thanks for choosing [facility], we consider it a privelige to serve you.

## 2019-05-12 ENCOUNTER — Encounter: Payer: Self-pay | Admitting: Family Medicine

## 2019-05-12 ENCOUNTER — Telehealth: Payer: Self-pay

## 2019-05-12 ENCOUNTER — Other Ambulatory Visit: Payer: Self-pay | Admitting: Family Medicine

## 2019-05-12 DIAGNOSIS — R7989 Other specified abnormal findings of blood chemistry: Secondary | ICD-10-CM

## 2019-05-12 MED ORDER — LEVOTHYROXINE SODIUM 100 MCG PO TABS: 100.0000 ug | ORAL_TABLET | Freq: Every day | ORAL | 1 refills | Status: DC

## 2019-05-12 MED ORDER — ERGOCALCIFEROL 1.25 MG (50000 UT) PO CAPS: 50000.0000 [IU] | ORAL_CAPSULE | ORAL | 3 refills | Status: DC

## 2019-05-12 NOTE — Telephone Encounter (Signed)
TSH ordered per MD to be drawn in 3 mths

## 2019-05-12 NOTE — Telephone Encounter (Signed)
Called patient to give advise her of her thyroid ultrasound appt which was scheduled for May 18, 2019 at 1:30. She stated that is when she goes back to work. Gave her the hospital main number to call and reschedule around her work schedule. Advised her she will need TSH rechecked in 3 mths and I will order and mail lab req to her with verbal understanding.

## 2019-05-14 ENCOUNTER — Encounter: Payer: Self-pay | Admitting: Family Medicine

## 2019-05-14 LAB — TEST AUTHORIZATION

## 2019-05-14 LAB — CYTOLOGY - PAP
Diagnosis: NEGATIVE
HPV: NOT DETECTED

## 2019-05-14 LAB — COMPLETE METABOLIC PANEL WITH GFR
AG Ratio: 1.5 (calc) (ref 1.0–2.5)
ALT: 23 U/L (ref 6–29)
AST: 19 U/L (ref 10–35)
Albumin: 4.4 g/dL (ref 3.6–5.1)
Alkaline phosphatase (APISO): 71 U/L (ref 37–153)
BUN: 13 mg/dL (ref 7–25)
CO2: 26 mmol/L (ref 20–32)
Calcium: 9.7 mg/dL (ref 8.6–10.4)
Chloride: 104 mmol/L (ref 98–110)
Creat: 0.81 mg/dL (ref 0.50–1.05)
GFR, Est African American: 97 mL/min/{1.73_m2} (ref >=60)
GFR, Est Non African American: 84 mL/min/{1.73_m2} (ref >=60)
Globulin: 2.9 g/dL (calc) (ref 1.9–3.7)
Glucose, Bld: 87 mg/dL (ref 65–99)
Potassium: 4.4 mmol/L (ref 3.5–5.3)
Sodium: 138 mmol/L (ref 135–146)
Total Bilirubin: 0.6 mg/dL (ref 0.2–1.2)
Total Protein: 7.3 g/dL (ref 6.1–8.1)

## 2019-05-14 LAB — CBC
HCT: 43.2 % (ref 35.0–45.0)
Hemoglobin: 14.3 g/dL (ref 11.7–15.5)
MCH: 28 pg (ref 27.0–33.0)
MCHC: 33.1 g/dL (ref 32.0–36.0)
MCV: 84.5 fL (ref 80.0–100.0)
MPV: 10.7 fL (ref 7.5–12.5)
Platelets: 360 10*3/uL (ref 140–400)
RBC: 5.11 10*6/uL — ABNORMAL HIGH (ref 3.80–5.10)
RDW: 14.6 % (ref 11.0–15.0)
WBC: 6.7 10*3/uL (ref 3.8–10.8)

## 2019-05-14 LAB — LIPID PANEL
Cholesterol: 208 mg/dL — ABNORMAL HIGH (ref <200)
HDL: 60 mg/dL (ref >=50)
LDL Cholesterol (Calc): 126 mg/dL (calc) — ABNORMAL HIGH
Non-HDL Cholesterol (Calc): 148 mg/dL (calc) — ABNORMAL HIGH (ref <130)
Total CHOL/HDL Ratio: 3.5 (calc) (ref <5.0)
Triglycerides: 108 mg/dL (ref <150)

## 2019-05-14 LAB — HEMOGLOBIN A1C
Hgb A1c MFr Bld: 5.7 % of total Hgb — ABNORMAL HIGH (ref <5.7)
Mean Plasma Glucose: 117 (calc)
eAG (mmol/L): 6.5 (calc)

## 2019-05-14 LAB — TSH: TSH: 16.02 mIU/L — ABNORMAL HIGH

## 2019-05-14 LAB — T3, FREE: T3, Free: 2.4 pg/mL (ref 2.3–4.2)

## 2019-05-14 LAB — VITAMIN D 25 HYDROXY (VIT D DEFICIENCY, FRACTURES): Vit D, 25-Hydroxy: 14 ng/mL — ABNORMAL LOW (ref 30–100)

## 2019-05-14 LAB — T4, FREE: Free T4: 1 ng/dL (ref 0.8–1.8)

## 2019-05-16 ENCOUNTER — Encounter: Payer: Self-pay | Admitting: Gastroenterology

## 2019-05-16 ENCOUNTER — Ambulatory Visit (HOSPITAL_COMMUNITY): Payer: Medicaid Other

## 2019-05-18 ENCOUNTER — Ambulatory Visit (HOSPITAL_COMMUNITY): Payer: BC Managed Care – PPO

## 2019-05-25 ENCOUNTER — Ambulatory Visit (HOSPITAL_COMMUNITY): Payer: BC Managed Care – PPO

## 2019-05-26 ENCOUNTER — Encounter: Payer: Self-pay | Admitting: Obstetrics and Gynecology

## 2019-05-26 ENCOUNTER — Other Ambulatory Visit: Payer: Self-pay | Admitting: Obstetrics and Gynecology

## 2019-05-26 ENCOUNTER — Ambulatory Visit (INDEPENDENT_AMBULATORY_CARE_PROVIDER_SITE_OTHER): Payer: BC Managed Care – PPO | Admitting: Obstetrics and Gynecology

## 2019-05-26 ENCOUNTER — Other Ambulatory Visit: Payer: Self-pay

## 2019-05-26 VITALS — BP 131/95 | HR 81 | Ht 64.0 in | Wt 220.4 lb

## 2019-05-26 DIAGNOSIS — N926 Irregular menstruation, unspecified: Secondary | ICD-10-CM | POA: Diagnosis not present

## 2019-05-26 NOTE — Progress Notes (Signed)
Patient ID: [name], female   DOB: 05/16/1966, 53 y.o.   MRN: [id number]    [place] [facility] Visit  @DATE @            Patient name: [name] MRN [id number]  Date of birth: 03/16/1966  CC & HPI:  [name] is a 53 y.o. female [place] presenting today for menorrhagia. Last month, her period was heavy for 3 weeks. She was given BCPs, which she took for a few days, and when she stopped taking them, her period started again.    The patient had a BTL. She is married and sexually active. Her last PAP was on 05/11/2019 and was normal. The patient denies fever, chills or any other symptoms or complaints at this time.   ROS:  ROS  + menorrhagia - fever - chills All systems are negative except as noted in the HPI and PMH.   Pertinent History Reviewed:   Reviewed: Medical         Past Medical History:  Diagnosis Date  . Asthma 2010  . Eczema since childhoosd   flare since 2011  . Hypertension                               Surgical Hx:    Past Surgical History:  Procedure Laterality Date  . ANKLE SURGERY    . BACK SURGERY    . KNEE SURGERY    . TUBAL LIGATION     Medications: Reviewed & Updated - see associated section                       Current Outpatient Medications:  .  albuterol (VENTOLIN HFA) 108 (90 Base) MCG/ACT inhaler, Inhale 2 puffs into the lungs every 6 (six) hours as needed for wheezing or shortness of breath., Disp: 8 g, Rfl: 3 .  amLODipine (NORVASC) 10 MG tablet, Take 1 tablet (10 mg total) by mouth daily., Disp: 90 tablet, Rfl: 1 .  budesonide-formoterol (SYMBICORT) 160-4.5 MCG/ACT inhaler, INHALE 2 PUFFS INTO THEL LUNGS 2 TIMES A DAY, Disp: 10.2 Inhaler, Rfl: 3 .  cyclobenzaprine (FLEXERIL) 10 MG tablet, TAKE 1 TABLET BY MOUTH THREE TIMES DAILY AS NEEDED FOR MUSCLE SPASM, Disp: 30 tablet, Rfl: 0 .  ergocalciferol (VITAMIN D2) 1.25 MG (50000 UT) capsule, Take 1 capsule (50,000 Units total) by mouth once a week.  One capsule once weekly, Disp: 12 capsule, Rfl: 3 .  hydrOXYzine (ATARAX/VISTARIL) 25 MG tablet, Take 2-3 tabs at bedtime, Disp: 270 tablet, Rfl: 1 .  ipratropium-albuterol (DUONEB) 0.5-2.5 (3) MG/3ML SOLN, USE ONE VIAL VIA NEBULIZER EVERY 8 HOURS AS NEEDED, FOR SEVERE WHEEZING AS DIRECTED, Disp: 360 mL, Rfl: 0 .  levothyroxine (SYNTHROID) 100 MCG tablet, Take 1 tablet (100 mcg total) by mouth daily., Disp: 90 tablet, Rfl: 1 .  loratadine (CLARITIN) 10 MG tablet, Take 1 tablet (10 mg total) by mouth daily., Disp: 90 tablet, Rfl: 1 .  medroxyPROGESTERone (PROVERA) 10 MG tablet, Take one tablet three times daily for 3 days , then take one tablet two times daily for 2 weeks, Disp: 23 tablet, Rfl: 0 .  montelukast (SINGULAIR) 10 MG tablet, TAKE 1 TABLET BY MOUTH EVERY DAY, Disp: 90 tablet, Rfl: 1 .  norgestimate-ethinyl estradiol (ORTHO-CYCLEN) 0.25-35 MG-MCG tablet, Take 1 tablet by mouth daily., Disp: 1 Package, Rfl: 2 .  tacrolimus (PROTOPIC) 0.1 %  ointment, Apply to face once or twice daily as directed, Disp: 30 g, Rfl: 3 .  triamcinolone cream (KENALOG) 0.1 %, APPLY TO AFFECTED AREA ON BODY (NOT FACE) TWICE DAILY FOR 1-2 WEEKS, Disp: 454 g, Rfl: 3 .  venlafaxine XR (EFFEXOR-XR) 37.5 MG 24 hr capsule, TAKE 1 CAPSULE (37.5 MG TOTAL) BY MOUTH DAILY WITH BREAKFAST., Disp: 90 capsule, Rfl: 0 .  spironolactone (ALDACTONE) 25 MG tablet, TAKE 1 TABLET BY MOUTH EVERY DAY, Disp: 90 tablet, Rfl: 1   Social History: Reviewed -  reports that she has never smoked. She has never used smokeless tobacco.  Objective Findings:  Vitals: Blood pressure (!) 131/95, pulse 81, height 5\' 4"  (1.626 m), weight 220 lb 6.4 oz (100 kg).  PHYSICAL EXAMINATION General appearance - alert, well appearing, and in no distress, oriented to person, place, and time and overweight Mental status - alert, oriented to person, place, and time, normal mood, behavior, speech, dress, motor activity, and thought processes, affect appropriate  to mood  PELVIC DEFERRED  Assessment & Plan:   A:  1.  Menorrhagia  P:  1.  Schedule [facility] pelvis complete at [facility] 2.  F/U by phone after U/S to discuss IUD placement  By signing my name below, I, De Burrs, attest that this documentation has been prepared under the direction and in the presence of Jonnie Kind, MD. Electronically Signed: De Burrs, Medical Scribe. 05/26/19. 9:47 AM.  I personally performed the services described in this documentation, which was SCRIBED in my presence. The recorded information has been reviewed and considered accurate. It has been edited as necessary during review. Jonnie Kind, MD

## 2019-06-03 ENCOUNTER — Ambulatory Visit (HOSPITAL_COMMUNITY): Admission: RE | Admit: 2019-06-03 | Payer: BC Managed Care – PPO | Source: Ambulatory Visit

## 2019-06-03 ENCOUNTER — Ambulatory Visit (HOSPITAL_COMMUNITY): Payer: BC Managed Care – PPO

## 2019-06-06 ENCOUNTER — Ambulatory Visit (HOSPITAL_COMMUNITY)
Admission: RE | Admit: 2019-06-06 | Discharge: 2019-06-06 | Disposition: A | Discharge status: Home / Self Care | Payer: BC Managed Care – PPO | Source: Ambulatory Visit | Attending: Family Medicine | Admitting: Family Medicine

## 2019-06-06 ENCOUNTER — Other Ambulatory Visit: Payer: Self-pay

## 2019-06-06 DIAGNOSIS — R7989 Other specified abnormal findings of blood chemistry: Secondary | ICD-10-CM | POA: Diagnosis not present

## 2019-06-06 DIAGNOSIS — Z1231 Encounter for screening mammogram for malignant neoplasm of breast: Secondary | ICD-10-CM

## 2019-06-07 ENCOUNTER — Ambulatory Visit (HOSPITAL_COMMUNITY)
Admission: RE | Admit: 2019-06-07 | Discharge: 2019-06-07 | Disposition: A | Discharge status: Home / Self Care | Payer: BC Managed Care – PPO | Source: Ambulatory Visit | Attending: Family Medicine | Admitting: Family Medicine

## 2019-06-07 DIAGNOSIS — R7989 Other specified abnormal findings of blood chemistry: Secondary | ICD-10-CM

## 2019-06-09 ENCOUNTER — Other Ambulatory Visit: Payer: Self-pay | Admitting: Family Medicine

## 2019-06-09 DIAGNOSIS — E041 Nontoxic single thyroid nodule: Secondary | ICD-10-CM

## 2019-06-09 NOTE — Progress Notes (Signed)
[facility]

## 2019-06-15 ENCOUNTER — Other Ambulatory Visit (HOSPITAL_COMMUNITY): Payer: Self-pay | Admitting: Otolaryngology

## 2019-06-15 ENCOUNTER — Ambulatory Visit (HOSPITAL_COMMUNITY)
Admission: RE | Admit: 2019-06-15 | Discharge: 2019-06-15 | Disposition: A | Discharge status: Home / Self Care | Payer: BC Managed Care – PPO | Source: Ambulatory Visit | Attending: Obstetrics and Gynecology | Admitting: Obstetrics and Gynecology

## 2019-06-15 ENCOUNTER — Other Ambulatory Visit: Payer: Self-pay

## 2019-06-15 ENCOUNTER — Ambulatory Visit (HOSPITAL_COMMUNITY): Payer: BC Managed Care – PPO

## 2019-06-15 DIAGNOSIS — N926 Irregular menstruation, unspecified: Secondary | ICD-10-CM

## 2019-06-15 DIAGNOSIS — E041 Nontoxic single thyroid nodule: Secondary | ICD-10-CM

## 2019-06-22 ENCOUNTER — Encounter (HOSPITAL_COMMUNITY): Payer: Self-pay

## 2019-06-22 ENCOUNTER — Other Ambulatory Visit: Payer: Self-pay

## 2019-06-22 ENCOUNTER — Ambulatory Visit (HOSPITAL_COMMUNITY)
Admission: RE | Admit: 2019-06-22 | Discharge: 2019-06-22 | Disposition: A | Discharge status: Home / Self Care | Payer: BC Managed Care – PPO | Source: Ambulatory Visit | Attending: Otolaryngology | Admitting: Otolaryngology

## 2019-06-22 DIAGNOSIS — E041 Nontoxic single thyroid nodule: Secondary | ICD-10-CM | POA: Insufficient documentation

## 2019-06-22 MED ORDER — LIDOCAINE HCL (PF) 2 % IJ SOLN
INTRAMUSCULAR | Status: AC
  Filled 2019-06-22: qty 10

## 2019-06-27 ENCOUNTER — Ambulatory Visit (INDEPENDENT_AMBULATORY_CARE_PROVIDER_SITE_OTHER): Payer: Self-pay | Admitting: *Deleted

## 2019-06-27 ENCOUNTER — Other Ambulatory Visit: Payer: Self-pay

## 2019-06-27 DIAGNOSIS — Z1211 Encounter for screening for malignant neoplasm of colon: Secondary | ICD-10-CM

## 2019-06-27 MED ORDER — PEG 3350-KCL-NA BICARB-NACL 420 G PO SOLR: 4000.0000 mL | Freq: Once | ORAL | 0 refills | Status: AC

## 2019-06-27 NOTE — Progress Notes (Addendum)
Gastroenterology Pre-Procedure Review  Request Date: 06/27/2019 Requesting Physician: Dr. Moshe Cipro, no previous TCS  PATIENT REVIEW QUESTIONS: The patient responded to the following health history questions as indicated:    1. Diabetes Melitis: No 2. Joint replacements in the past 12 months: No 3. Major health problems in the past 3 months: Yes, thyroid enlarged with mass, had thyroid biopsy 4. Has an artificial valve or MVP: No 5. Has a defibrillator: No 6. Has been advised in past to take antibiotics in advance of a procedure like teeth cleaning: No 7. Family history of colon cancer: No 8. Alcohol Use: Yes, 1 glass of wine twice a week 9. History of sleep apnea: No 10. History of coronary artery or other vascular stents placed within the last 12 months: No 11. History of any prior anesthesia complications: No    MEDICATIONS & ALLERGIES:    Patient reports the following regarding taking any blood thinners:   Plavix? No Aspirin? No Coumadin? No Brilinta? No Xarelto? No Eliquis? No Pradaxa? No Savaysa? No Effient? No  Patient confirms/reports the following medications:  Current Outpatient Medications  Medication Sig Dispense Refill  . budesonide-formoterol (SYMBICORT) 160-4.5 MCG/ACT inhaler INHALE 2 PUFFS INTO THEL LUNGS 2 TIMES A DAY 10.2 Inhaler 4  . hydrOXYzine (ATARAX/VISTARIL) 25 MG tablet Take 2-3 tabs at bedtime (Patient taking differently: Take 25-75 mg by mouth at bedtime as needed for itching. ) 270 tablet 1  . levothyroxine (SYNTHROID) 100 MCG tablet TAKE 1 TABLET BY MOUTH EVERY DAY (Patient taking differently: Take 100 mcg by mouth daily before breakfast. ) 90 tablet 1  . megestrol (MEGACE) 40 MG tablet TAKE 1 TABLET BY MOUTH EVERY DAY (Patient taking differently: Take 40 mg by mouth daily. ) 30 tablet 5  . montelukast (SINGULAIR) 10 MG tablet TAKE 1 TABLET BY MOUTH EVERY DAY (Patient taking differently: Take 10 mg by mouth daily. ) 90 tablet 3  . triamcinolone cream  (KENALOG) 0.1 % APPLY TO AFFECTED AREA ON BODY (NOT FACE) TWICE DAILY FOR 1-2 WEEKS (Patient taking differently: Apply 1 application topically daily. ) 454 g 3  . SYMBICORT 160-4.5 MCG/ACT inhaler INHALE 2 PUFFS INTO THEL LUNGS 2 TIMES A DAY (Patient taking differently: Inhale 2 puffs into the lungs in the morning and at bedtime. ) 10.2 Inhaler 3   No current facility-administered medications for this visit.    Patient confirms/reports the following allergies:  Allergies  Allergen Reactions  . Bactrim [Sulfamethoxazole-Trimethoprim] Other (See Comments)    Numbness, mouth pain, tightness in throat  . Penicillins     Has patient had a PCN reaction causing immediate rash, facial/tongue/throat swelling, SOB or lightheadedness with hypotension: Yes Has patient had a PCN reaction causing severe rash involving mucus membranes or skin necrosis: No Has patient had a PCN reaction that required hospitalization No Has patient had a PCN reaction occurring within the last 10 years: NoN  If all of the above answers are "NO", then may proceed with Cephalosporin use.     REACTION: At age 21 had heel sore and was put on R  . Latex Rash    No orders of the defined types were placed in this encounter.   AUTHORIZATION INFORMATION Primary Insurance: [place],  [place] #:[id number] ,  Group #: [id number] Pre-Cert / Auth required: No, file to [employer]  Secondary Insurance: Medicaid [place],  ID #: [id number] M Pre-Cert / Josem Kaufmann required: No, not required   SCHEDULE INFORMATION: Procedure has been scheduled as follows:  Date: 08/31/2019, Time: 1:00 Location: [facility] with Dr. Gala Romney  This Gastroenterology Pre-Precedure Review Form is being routed to the following provider(s): Neil Crouch, PA-C

## 2019-06-27 NOTE — Patient Instructions (Addendum)
[place]   Please notify us immediately if you are diabetic, take iron supplements, or if you are on coumadin or any blood thinners.   Patient Name: [name] Date of procedure: 04/27/2020 Time to register at [facility]: 6:30 am Provider: Dr. Gala Romney   Purchase: MIRALAX 238 gram bottle, 1 FLEET ENEMA, 1 box of DULCOLAX (All over the counter medications)    04/25/2020- 2 Days prior to procedure: START CLEAR LIQUID DIET AFTER YOUR LUNCH MEAL--NO SOLID FOODS!   04/26/2020- 1 Day prior to procedure:   CLEAR LIQUIDS ALL DAY--NO SOLID FOODS!   Diabetic medication adjustments for today: n/a  At 10:00 AM, take 2 DULCOLAX '5mg'$  tablets   At 12:00 PM, Mix 5 teaspoons of Miralax in any 4-6 ounces of CLEAR LIQUIDS (Gatorade) every hour for 5 hours until passing clear, watery stools. Be sure to drink 4 ounces of clear liquid 30 minutes after each dose of Miralax.   At 3:00 PM, take 2 Dulcolax '5mg'$  tablets   If stools are not clear & watery by 6:00 PM, take 5 teaspoons of Miralax every 30 minutes until stools are clear (no color)   You must have a complete prep to ensure the most effective cleaning.   CONTINUE CLEAR LIQUIDS ONLY UNTIL MIDNIGHT. Make a conscious effort to drink as much as you can before, during & after the preparation.    NOTHING TO EAT OR DRINK AFTER MIDNIGHT except for your heart, blood pressure & breathing medications. You may take them with a sip of clear liquids.    04/27/2020- Day of Procedure  Give yourself one Fleet enema about 1 hour prior to leaving for the hospital.   Diabetic medication adjustments for today: n/a  You may take TYLENOL products. Please continue your regular medications unless we have instructed you otherwise.    Please note, on the day of your procedure you MUST be accompanied by an adult who is willing to assume responsibility for you at time of discharge. If you do not have such person with you, your  procedure will have to be rescheduled.                                                             Please leave ALL jewelry at home prior to coming to the hospital for your procedure.   *It is your responsibility to check with your insurance company for the benefits of coverage you have for this procedure. Unfortunately, not all insurance companies have benefits to cover all or part of these types of procedures. It is your responsibility to check your benefits, however we will be glad to assist you with any codes your insurance company may need.   Please note that most insurance companies will not cover a screening colonoscopy for people under the age of 97. For example, with some insurance companies you may have benefits for a screening colonoscopy, but if polyps are found the diagnosis will change and then you may have a deductible that will need to be met. Please make sure you check your benefits for screening colonoscopy as well as a diagnostic colonoscopy.    CLEAR LIQUIDS: (NO RED)  Jello Apple Juice White Grape Juice Water  Banana popsicles Kool-Aid Coffee(No cream or milk)  Tea (No  cream or milk) Soft drinks Broth (fat free beef/chicken/vegetable)   Clear liquids allow you to see your fingers on the other side of the glass. Be sure they are NOT RED in color, cloudy, but CLEAR.   Do Not Eat:  Dairy products of any kind Cranberry juice  Tomato or V8 Juice Orange Juice  Grapefruit Juice Red Grape Juice  Solid foods like cereal, oatmeal, yogurt, fruits, vegetables, creamed soups, eggs, bread, etc   HELPFUL HINTS TO MAKE DRINKING EASIER:  -Make sure prep is extremely COLD. Refrigerate the night before. You may also put in freezer.  -You may try mixing Crystal Light or Country Time Lemonade if you prefer. MIx in small amounts. Add more if necessary.  -Trying drinking through a straw.  -Rinse mouth with water or mouthwash  between glasses to remove aftertaste.  -Try sipping on a cold beverage/ice popsicles between glasses of prep.  -Place a piece of sugar-free hard candy in mouth between glasses.  -If you become nauseated, try consuming smaller amounts or stretch out the time between glasses. Stop for 30 minutes to an hour & slowly start back drinking.   Call our office with any questions or concerns at [phone].   Thank You

## 2019-06-28 ENCOUNTER — Ambulatory Visit: Payer: BC Managed Care – PPO | Admitting: Internal Medicine

## 2019-06-28 NOTE — Progress Notes (Signed)
Ok to schedule.

## 2019-06-29 NOTE — Addendum Note (Signed)
Addended by: Metro Kung on: 06/29/2019 08:56 AM   Modules accepted: Orders, SmartSet

## 2019-07-05 ENCOUNTER — Other Ambulatory Visit: Payer: Self-pay | Admitting: Family Medicine

## 2019-07-15 ENCOUNTER — Ambulatory Visit: Payer: BC Managed Care – PPO | Admitting: Adult Health

## 2019-07-19 ENCOUNTER — Ambulatory Visit (INDEPENDENT_AMBULATORY_CARE_PROVIDER_SITE_OTHER): Payer: BC Managed Care – PPO | Admitting: Adult Health

## 2019-07-19 ENCOUNTER — Encounter: Payer: Self-pay | Admitting: Adult Health

## 2019-07-19 ENCOUNTER — Other Ambulatory Visit: Payer: Self-pay

## 2019-07-19 VITALS — BP 135/90 | HR 87 | Ht 64.0 in | Wt 216.5 lb

## 2019-07-19 DIAGNOSIS — D219 Benign neoplasm of connective and other soft tissue, unspecified: Secondary | ICD-10-CM

## 2019-07-19 DIAGNOSIS — N920 Excessive and frequent menstruation with regular cycle: Secondary | ICD-10-CM

## 2019-07-19 MED ORDER — MEGESTROL ACETATE 40 MG PO TABS: 40.0000 mg | ORAL_TABLET | Freq: Every day | ORAL | 3 refills | Status: DC

## 2019-07-19 NOTE — Progress Notes (Signed)
Patient ID: [name], female   DOB: [birth year], 53 y.o.   MRN: [id number] History of Present Illness: [name] is a 53 year old black female, married,G1P1, in complaining of bleeding again since stopping OCs, sprintec. She saw Dr [name] 05/26/2019. She had [place] 06/15/19 that showed multiple fibroids, normal ovaries and ECC 6.7 mm. She says periods have been regular, has never missed one except when on sprintec.  PCP is Dr [name]    Current Medications, Allergies, Past Medical History, Past Surgical History, Family History and Social History were reviewed in [place] record.     Review of Systems: +bleeding, heavy at times  +regular periods  +bloated when on OCs   Physical Exam:BP 135/90 (BP Location: Left Arm, Patient Position: Sitting, Cuff Size: Large)   Pulse 87   Ht 5\' 4"  (1.626 m)   Wt 216 lb 8 oz (98.2 kg)   LMP 07/15/2019   BMI 37.16 kg/m  General:  Well developed, well nourished, no acute distress Skin:  Warm and dry Neck:  Midline trachea, normal thyroid, good ROM, no lymphadenopathy Lungs; Clear to auscultation bilaterally Cardiovascular: Regular rate and rhythm Psych:  No mood changes, alert and cooperative,seems happy Fall risk is low.    Impression and Plan:  1. Menorrhagia with regular cycle -will try megace to stop periods, discussed with Dr [name] Meds ordered this encounter  Medications  . megestrol (MEGACE) 40 MG tablet    Sig: Take 1 tablet (40 mg total) by mouth daily.    Dispense:  45 tablet    Refill:  3    Order Specific Question:   Supervising Provider    Answer:   [name] [[id number]]  -follow up in 8 weeks or sooner if needed   2. Fibroids

## 2019-08-01 ENCOUNTER — Telehealth: Payer: Self-pay | Admitting: *Deleted

## 2019-08-01 NOTE — Telephone Encounter (Signed)
Please advise 

## 2019-08-01 NOTE — Telephone Encounter (Signed)
pLS LET PT KNOW THAT I CAN SEND A NOTE STATING THAT SHE DOES HAVE A MEDICAL DX OF ASTHMA. I AM NOT ABLE TO STATE THAT AS A RESULT SHE HAS TO PULL DOWN MASYK INTERMITTENTLY, THAT WILL BE DETERMINED BY COMPANY POLICY, BUT I CAN  MODIFY AND SAY THAT DURING A MILD FLARE  (SEE BELOW) AFTER YOU SPK WITH HER PLS  TYPE LETTER WITH HER  NAME, Dob.   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 WHEN SHE IS EXPERIENCING A MILD  FLARE SHE WILL HAVE INCREASED DIFFICULTY BREATHING ANDD MAY NEED TO PULL DOWN HER MASK INTERMITTENTLY AT THAT TIME.  SINCERELY I WILL SIGN AND YOU FAX IN ONCE COMPLETED  ?? PLEASE ASK  TJANKS

## 2019-08-01 NOTE — Telephone Encounter (Signed)
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 This note can be faxed to [phone]

## 2019-08-17 ENCOUNTER — Telehealth: Payer: Self-pay | Admitting: Internal Medicine

## 2019-08-17 NOTE — Telephone Encounter (Signed)
Pt said she couldn't get the time off work to have her colonoscopy done and when she will call back in January to reschedule. She needs to cancelled her procedure with RMR on 11/18

## 2019-08-18 NOTE — Telephone Encounter (Signed)
Lmom for pt to call me back to see if she wants to go ahead and reschedule since we are scheduling for Jan/Feb right now.

## 2019-08-19 NOTE — Telephone Encounter (Signed)
Left another message on vm to follow up with pt.

## 2019-08-22 NOTE — Telephone Encounter (Signed)
Lmom for pt to call me back. 

## 2019-08-22 NOTE — Telephone Encounter (Signed)
Pt called back and rescheduled her Covid screening to 11/07/2019 and her procedure to 11/09/2019.  Pt aware that I will mail out new prep instructions.  Pt also advised that her prep kit should still be at her pharmacy to pick up.  Pt voiced understanding.  Kim in Endo notified of new procedure date.

## 2019-08-29 ENCOUNTER — Other Ambulatory Visit (HOSPITAL_COMMUNITY): Payer: BC Managed Care – PPO

## 2019-09-09 ENCOUNTER — Other Ambulatory Visit: Payer: Self-pay | Admitting: Family Medicine

## 2019-09-10 ENCOUNTER — Encounter: Payer: Self-pay | Admitting: Family Medicine

## 2019-09-12 ENCOUNTER — Other Ambulatory Visit: Payer: Self-pay

## 2019-09-12 ENCOUNTER — Other Ambulatory Visit: Payer: Self-pay | Admitting: Family Medicine

## 2019-09-12 ENCOUNTER — Encounter: Payer: Self-pay | Admitting: Family Medicine

## 2019-09-12 MED ORDER — SYMBICORT 160-4.5 MCG/ACT IN AERO: INHALATION_SPRAY | RESPIRATORY_TRACT | 3 refills | Status: DC

## 2019-09-12 MED ORDER — BUDESONIDE-FORMOTEROL FUMARATE 160-4.5 MCG/ACT IN AERO: INHALATION_SPRAY | RESPIRATORY_TRACT | 3 refills | Status: DC

## 2019-09-12 MED ORDER — BUDESONIDE-FORMOTEROL FUMARATE 160-4.5 MCG/ACT IN AERO: INHALATION_SPRAY | RESPIRATORY_TRACT | 4 refills | Status: DC

## 2019-09-12 NOTE — Telephone Encounter (Signed)
Per Cheri Rous this will need an authorization. Once completed let the pt know.

## 2019-09-12 NOTE — Telephone Encounter (Signed)
I called [facility] to [facility] and they do not have the medication sent in , please resend it

## 2019-09-13 ENCOUNTER — Ambulatory Visit: Payer: BC Managed Care – PPO | Admitting: Adult Health

## 2019-10-24 ENCOUNTER — Telehealth: Payer: Self-pay | Admitting: *Deleted

## 2019-10-24 NOTE — Telephone Encounter (Signed)
Lmom for pt to call us back. Need to inform pt that we need to cancel her Covid screening and procedure due to Covid surge.

## 2019-10-24 NOTE — Telephone Encounter (Signed)
Pt called back.  Informed pt that we needed to cancel her Covid screening and procedure due to Covid surge.  Pt aware that we will call her back to reschedule once we get clearance.  Pt voiced understanding.  Endo notified.

## 2019-11-01 ENCOUNTER — Other Ambulatory Visit: Payer: Self-pay | Admitting: Family Medicine

## 2019-11-01 ENCOUNTER — Encounter: Payer: Self-pay | Admitting: Family Medicine

## 2019-11-01 MED ORDER — NORGESTIM-ETH ESTRAD TRIPHASIC 0.18/0.215/0.25 MG-35 MCG PO TABS: ORAL_TABLET | ORAL | 0 refills | Status: DC

## 2019-11-01 MED ORDER — NORGESTIM-ETH ESTRAD TRIPHASIC 0.18/0.215/0.25 MG-35 MCG PO TABS: 1.0000 | ORAL_TABLET | Freq: Every day | ORAL | 1 refills | Status: DC

## 2019-11-07 ENCOUNTER — Other Ambulatory Visit (HOSPITAL_COMMUNITY): Payer: BC Managed Care – PPO

## 2019-11-09 ENCOUNTER — Ambulatory Visit: Payer: BC Managed Care – PPO | Admitting: Family Medicine

## 2019-11-09 ENCOUNTER — Encounter (HOSPITAL_COMMUNITY): Payer: Self-pay

## 2019-11-09 ENCOUNTER — Ambulatory Visit (HOSPITAL_COMMUNITY): Admit: 2019-11-09 | Payer: BC Managed Care – PPO | Admitting: Internal Medicine

## 2019-11-09 SURGERY — COLONOSCOPY
Anesthesia: Moderate Sedation

## 2019-11-10 ENCOUNTER — Telehealth: Payer: Self-pay | Admitting: *Deleted

## 2019-11-10 NOTE — Telephone Encounter (Signed)
Called and [facility] for pt to call us back.  Need to reschedule pt's procedure.

## 2019-11-16 ENCOUNTER — Telehealth: Payer: Self-pay | Admitting: Gastroenterology

## 2019-11-16 ENCOUNTER — Other Ambulatory Visit: Payer: Self-pay | Admitting: Family Medicine

## 2019-11-16 ENCOUNTER — Telehealth: Payer: Self-pay | Admitting: *Deleted

## 2019-11-16 ENCOUNTER — Encounter: Payer: Self-pay | Admitting: *Deleted

## 2019-11-16 NOTE — Telephone Encounter (Signed)
Mailed letter to pt informing her that we need to reschedule her procedure.

## 2019-11-16 NOTE — Telephone Encounter (Signed)
ERROR

## 2019-11-29 NOTE — Telephone Encounter (Signed)
Pt called in today and rescheduled her procedure to 03/06/2020.  Pt is aware that I will mail her out prep instructions and Covid screening information.  Pt voiced understanding.

## 2019-11-30 ENCOUNTER — Other Ambulatory Visit: Payer: Self-pay | Admitting: Family Medicine

## 2019-12-15 ENCOUNTER — Encounter: Payer: Self-pay | Admitting: Family Medicine

## 2019-12-22 ENCOUNTER — Ambulatory Visit: Payer: BC Managed Care – PPO | Attending: Internal Medicine

## 2019-12-22 DIAGNOSIS — Z23 Encounter for immunization: Secondary | ICD-10-CM

## 2019-12-22 NOTE — Progress Notes (Signed)
   Covid-19 Vaccination Clinic  Name:  [name]    MRN: [id number] DOB: 12/22/1965  12/22/2019  [name] was observed post Covid-19 immunization for 15 minutes without incident. She was provided with Vaccine Information Sheet and instruction to access the V-Safe system.   [name] was instructed to call 911 with any severe reactions post vaccine: [name] Difficulty breathing  . Swelling of face and throat  . A fast heartbeat  . A bad rash all over body  . Dizziness and weakness   Immunizations Administered    Name Date Dose VIS Date Route   Moderna COVID-19 Vaccine 12/22/2019  9:05 AM 0.5 mL 09/13/2019 Intramuscular   Manufacturer: Moderna   Lot: [id number]   DerbyVO:[id number]

## 2020-01-11 ENCOUNTER — Encounter: Payer: Self-pay | Admitting: Family Medicine

## 2020-01-25 ENCOUNTER — Ambulatory Visit: Payer: BC Managed Care – PPO | Attending: Internal Medicine

## 2020-01-25 DIAGNOSIS — Z23 Encounter for immunization: Secondary | ICD-10-CM

## 2020-01-25 NOTE — Progress Notes (Signed)
   Covid-19 Vaccination Clinic  Name:  [name]    MRN: [id number] DOB: 21-Aug-1966  01/25/2020  [name] was observed post Covid-19 immunization for 15 minutes without incident. She was provided with Vaccine Information Sheet and instruction to access the V-Safe system.   [name] was instructed to call 911 with any severe reactions post vaccine: [name] Difficulty breathing  . Swelling of face and throat  . A fast heartbeat  . A bad rash all over body  . Dizziness and weakness   Immunizations Administered    Name Date Dose VIS Date Route   Moderna COVID-19 Vaccine 01/25/2020  8:29 AM 0.5 mL 09/13/2019 Intramuscular   Manufacturer: Moderna   Lot: [id number]   KellerBE:[id number]

## 2020-02-05 ENCOUNTER — Other Ambulatory Visit: Payer: Self-pay | Admitting: Family Medicine

## 2020-02-12 ENCOUNTER — Encounter: Payer: Self-pay | Admitting: Family Medicine

## 2020-02-13 ENCOUNTER — Other Ambulatory Visit: Payer: Self-pay

## 2020-02-13 MED ORDER — TRIAMCINOLONE ACETONIDE 0.1 % EX CREA: TOPICAL_CREAM | CUTANEOUS | 3 refills | Status: DC

## 2020-02-17 ENCOUNTER — Telehealth: Payer: Self-pay | Admitting: Gastroenterology

## 2020-02-17 NOTE — Telephone Encounter (Signed)
Lmom for pt to call me back. 

## 2020-02-17 NOTE — Telephone Encounter (Signed)
Routing to AS 

## 2020-02-17 NOTE — Telephone Encounter (Signed)
PATIENT NEEDS TO RESCHEDULE HER PROCEDURE DUE TO WORK

## 2020-02-20 NOTE — Telephone Encounter (Signed)
Lmom for pt to call me back. 

## 2020-02-21 NOTE — Progress Notes (Addendum)
Spoke to pt and she rescheduled her procedure.  Updated triage since it has been since Sept.  Pt says she is no longer taking Ortho Tri-Cyclen.  Megace has been added.  Routing to LSL as a FYI.

## 2020-02-21 NOTE — Telephone Encounter (Addendum)
Pt called back and rescheduled her procedure to 04/27/2020 at 7:30.  Pt is aware to arrive at 6:30 to register.  Pt scheduled her Covid screening for 04/25/2020 at 2:30.  Pt is aware that I am mailing out new prep instructions and Covid screening information.  Pt voiced understanding.  Made Hoyle Sauer in Endo aware.  Updated triage information with pt and routing it to LSL as FYI.

## 2020-02-27 ENCOUNTER — Other Ambulatory Visit: Payer: Self-pay | Admitting: Family Medicine

## 2020-03-02 ENCOUNTER — Other Ambulatory Visit (HOSPITAL_COMMUNITY): Payer: BC Managed Care – PPO

## 2020-03-07 ENCOUNTER — Other Ambulatory Visit: Payer: Self-pay | Admitting: Adult Health

## 2020-03-28 ENCOUNTER — Other Ambulatory Visit: Payer: Self-pay

## 2020-03-28 ENCOUNTER — Other Ambulatory Visit: Payer: Self-pay | Admitting: Family Medicine

## 2020-03-28 MED ORDER — AMLODIPINE BESYLATE 10 MG PO TABS: 10.0000 mg | ORAL_TABLET | Freq: Every day | ORAL | 1 refills | Status: DC

## 2020-03-28 MED ORDER — AMLODIPINE BESYLATE 10 MG PO TABS: 10.0000 mg | ORAL_TABLET | Freq: Every day | ORAL | 0 refills | Status: DC

## 2020-03-29 ENCOUNTER — Telehealth: Payer: Self-pay

## 2020-03-29 NOTE — Telephone Encounter (Signed)
LVM for the pt to call the office

## 2020-03-29 NOTE — Telephone Encounter (Signed)
-----   Message from Fayrene Helper, MD sent at 03/28/2020  5:04 PM EDT ----- Regarding: needs appt pls sched

## 2020-04-20 ENCOUNTER — Telehealth: Payer: Self-pay | Admitting: Internal Medicine

## 2020-04-20 NOTE — Telephone Encounter (Signed)
Pt is scheduled procedure with RMR on 7/16. [facility] said that [facility] told [facility] was on back order and we needed to call something else in. Please advise. [phone]

## 2020-04-23 ENCOUNTER — Telehealth: Payer: Self-pay | Admitting: Emergency Medicine

## 2020-04-23 ENCOUNTER — Encounter: Payer: Self-pay | Admitting: *Deleted

## 2020-04-23 NOTE — Telephone Encounter (Signed)
Lmom for pt to call me back. 

## 2020-04-23 NOTE — Telephone Encounter (Signed)
error 

## 2020-04-23 NOTE — Telephone Encounter (Signed)
Pt called back and is aware that we could try Miralax prep.  She said that she has my chart and would utilize that to see new instructions since she works until 5:30 every day.  She was made aware that all items needed can be purchased over the counter.  Pt voiced understanding.

## 2020-04-25 ENCOUNTER — Other Ambulatory Visit (HOSPITAL_COMMUNITY)
Admission: RE | Admit: 2020-04-25 | Discharge: 2020-04-25 | Disposition: A | Discharge status: Home / Self Care | Payer: BC Managed Care – PPO | Source: Ambulatory Visit | Attending: Internal Medicine | Admitting: Internal Medicine

## 2020-04-25 ENCOUNTER — Other Ambulatory Visit: Payer: Self-pay

## 2020-04-25 DIAGNOSIS — Z01812 Encounter for preprocedural laboratory examination: Secondary | ICD-10-CM | POA: Insufficient documentation

## 2020-04-25 DIAGNOSIS — Z20822 Contact with and (suspected) exposure to covid-19: Secondary | ICD-10-CM | POA: Insufficient documentation

## 2020-04-25 LAB — SARS CORONAVIRUS 2 (TAT 6-24 HRS): SARS Coronavirus 2: NEGATIVE

## 2020-04-27 ENCOUNTER — Encounter (HOSPITAL_COMMUNITY): Payer: Self-pay | Admitting: Internal Medicine

## 2020-04-27 ENCOUNTER — Other Ambulatory Visit: Payer: Self-pay

## 2020-04-27 ENCOUNTER — Ambulatory Visit (HOSPITAL_COMMUNITY)
Admission: RE | Admit: 2020-04-27 | Discharge: 2020-04-27 | Disposition: A | Discharge status: Home / Self Care | Payer: BC Managed Care – PPO | Attending: Internal Medicine | Admitting: Internal Medicine

## 2020-04-27 ENCOUNTER — Encounter (HOSPITAL_COMMUNITY)
Admission: RE | Disposition: A | Discharge status: Home / Self Care | Payer: Self-pay | Source: Home / Self Care | Attending: Internal Medicine

## 2020-04-27 DIAGNOSIS — Z7951 Long term (current) use of inhaled steroids: Secondary | ICD-10-CM | POA: Insufficient documentation

## 2020-04-27 DIAGNOSIS — L309 Dermatitis, unspecified: Secondary | ICD-10-CM | POA: Diagnosis not present

## 2020-04-27 DIAGNOSIS — Z79899 Other long term (current) drug therapy: Secondary | ICD-10-CM | POA: Diagnosis not present

## 2020-04-27 DIAGNOSIS — Z79890 Hormone replacement therapy (postmenopausal): Secondary | ICD-10-CM | POA: Insufficient documentation

## 2020-04-27 DIAGNOSIS — J45909 Unspecified asthma, uncomplicated: Secondary | ICD-10-CM | POA: Insufficient documentation

## 2020-04-27 DIAGNOSIS — Z1211 Encounter for screening for malignant neoplasm of colon: Secondary | ICD-10-CM | POA: Insufficient documentation

## 2020-04-27 DIAGNOSIS — I10 Essential (primary) hypertension: Secondary | ICD-10-CM | POA: Insufficient documentation

## 2020-04-27 HISTORY — PX: COLONOSCOPY: SHX5424

## 2020-04-27 SURGERY — COLONOSCOPY
Anesthesia: Moderate Sedation

## 2020-04-27 MED ORDER — ONDANSETRON HCL 4 MG/2ML IJ SOLN
INTRAMUSCULAR | Status: AC
  Filled 2020-04-27: qty 2

## 2020-04-27 MED ORDER — SODIUM CHLORIDE 0.9 % IV SOLN: INTRAVENOUS | Status: DC

## 2020-04-27 MED ORDER — STERILE WATER FOR IRRIGATION IR SOLN
Status: DC | PRN
  Administered 2020-04-27: 1.5 mL

## 2020-04-27 MED ORDER — MIDAZOLAM HCL 5 MG/5ML IJ SOLN
INTRAMUSCULAR | Status: AC
  Filled 2020-04-27: qty 10

## 2020-04-27 MED ORDER — MEPERIDINE HCL 100 MG/ML IJ SOLN
INTRAMUSCULAR | Status: DC | PRN
  Administered 2020-04-27: 25 mg via INTRAVENOUS
  Administered 2020-04-27: 10 mg via INTRAVENOUS
  Administered 2020-04-27: 15 mg via INTRAVENOUS

## 2020-04-27 MED ORDER — MEPERIDINE HCL 50 MG/ML IJ SOLN
INTRAMUSCULAR | Status: AC
  Filled 2020-04-27: qty 1

## 2020-04-27 MED ORDER — MIDAZOLAM HCL 5 MG/5ML IJ SOLN
INTRAMUSCULAR | Status: DC | PRN
  Administered 2020-04-27 (×2): 2 mg via INTRAVENOUS
  Administered 2020-04-27 (×3): 1 mg via INTRAVENOUS

## 2020-04-27 MED ORDER — ONDANSETRON HCL 4 MG/2ML IJ SOLN
INTRAMUSCULAR | Status: DC | PRN
  Administered 2020-04-27: 4 mg via INTRAVENOUS

## 2020-04-27 NOTE — H&P (Signed)
@LOGO @   Primary Care Physician:  Fayrene Helper, MD Primary Gastroenterologist:  Dr. Gala Romney  Pre-Procedure History & Physical: HPI:  [name] is a 54 y.o. female is here for a screening colonoscopy.  First ever average rescreening examination.  Past Medical History:  Diagnosis Date  . Asthma 2010  . Eczema since childhoosd   flare since 2011  . Fibroids   . Hypertension   . Menorrhagia     Past Surgical History:  Procedure Laterality Date  . ANKLE SURGERY    . BACK SURGERY    . KNEE SURGERY    . TUBAL LIGATION      Prior to Admission medications   Medication Sig Start Date End Date Taking? Authorizing Provider  amLODipine (NORVASC) 10 MG tablet Take 1 tablet (10 mg total) by mouth daily. 03/28/20  Yes Fayrene Helper, MD  budesonide-formoterol [facility]) 160-4.5 MCG/ACT inhaler INHALE 2 PUFFS INTO THEL LUNGS 2 TIMES A DAY 09/12/19  Yes Fayrene Helper, MD  hydrOXYzine (ATARAX/VISTARIL) 25 MG tablet Take 2-3 tabs at bedtime Patient taking differently: Take 25-75 mg by mouth at bedtime as needed for itching.  04/05/19  Yes Fayrene Helper, MD  levothyroxine (SYNTHROID) 100 MCG tablet TAKE 1 TABLET BY MOUTH EVERY DAY Patient taking differently: Take 100 mcg by mouth daily before breakfast.  12/01/19  Yes Perlie Mayo, NP  megestrol (MEGACE) 40 MG tablet TAKE 1 TABLET BY MOUTH EVERY DAY Patient taking differently: Take 40 mg by mouth daily.  03/07/20  Yes Derrek Monaco A, NP  montelukast (SINGULAIR) 10 MG tablet TAKE 1 TABLET BY MOUTH EVERY DAY Patient taking differently: Take 10 mg by mouth daily.  02/07/20  Yes Fayrene Helper, MD  SYMBICORT 160-4.5 MCG/ACT inhaler INHALE 2 PUFFS INTO THEL LUNGS 2 TIMES A DAY Patient taking differently: Inhale 2 puffs into the lungs in the morning and at bedtime.  02/27/20  Yes Fayrene Helper, MD  triamcinolone cream (KENALOG) 0.1 % APPLY TO AFFECTED AREA ON BODY (NOT FACE) TWICE DAILY FOR 1-2 WEEKS Patient  taking differently: Apply 1 application topically daily.  02/13/20  Yes Fayrene Helper, MD  albuterol (VENTOLIN HFA) 108 (90 Base) MCG/ACT inhaler Inhale 2 puffs into the lungs every 6 (six) hours as needed for wheezing or shortness of breath. Patient not taking: Reported on 04/17/2020 04/05/19   Fayrene Helper, MD  Norgestimate-Ethinyl Estradiol Triphasic (ORTHO TRI-CYCLEN, 28,) 0.18/0.215/0.25 MG-35 MCG tablet Take 1 tablet by mouth daily. Patient not taking: Reported on 02/21/2020 11/08/19   Fayrene Helper, MD  tacrolimus (PROTOPIC) 0.1 % ointment Apply to face once or twice daily as directed Patient not taking: Reported on 04/17/2020 04/05/19   Fayrene Helper, MD    Allergies as of 11/29/2019 - Review Complete 07/19/2019  Allergen Reaction Noted  . Bactrim [sulfamethoxazole-trimethoprim] Other (See Comments) 07/18/2016  . Penicillins  11/02/2008  . Latex Rash 01/22/2012    Family History  Problem Relation Age of Onset  . Asthma Paternal Aunt   . Asthma Paternal Uncle   . Liver disease Paternal Grandfather   . Aneurysm Maternal Grandfather        brain  . Heart attack Father     Social History   Socioeconomic History  . Marital status: Married    Spouse name: Not on file  . Number of children: Not on file  . Years of education: Not on file  . Highest education level: Not on file  Occupational  History  . Occupation: works with chemicals  Tobacco Use  . Smoking status: Never Smoker  . Smokeless tobacco: Never Used  Vaping Use  . Vaping Use: Never used  Substance and Sexual Activity  . Alcohol use: Yes    Comment: 1 glass of wine twice a week  . Drug use: No  . Sexual activity: Yes    Birth control/protection: Surgical    Comment: tubal  Other Topics Concern  . Not on file  Social History Narrative  . Not on file   Social Determinants of Health   Financial Resource Strain:   . Difficulty of Paying Living Expenses:   Food Insecurity:   . Worried  About Charity fundraiser in the Last Year:   . Arboriculturist in the Last Year:   Transportation Needs:   . Film/video editor (Medical):   [name] Lack of Transportation (Non-Medical):   Physical Activity:   . Days of Exercise per Week:   . Minutes of Exercise per Session:   Stress:   . Feeling of Stress :   Social Connections:   . Frequency of Communication with Friends and Family:   . Frequency of Social Gatherings with Friends and Family:   . Attends Religious Services:   . Active Member of Clubs or Organizations:   . Attends Archivist Meetings:   [name] Marital Status:   Intimate Partner Violence:   . Fear of Current or Ex-Partner:   . Emotionally Abused:   [name] Physically Abused:   . Sexually Abused:     Review of Systems: See HPI, otherwise negative ROS  Physical Exam: BP (!) 134/93   Pulse 84   Temp 98.1 F (36.7 C) (Oral)   Resp 16   Ht 5\' 4"  (1.626 m)   SpO2 99%   BMI 37.16 kg/m  General:   Alert,  Well-developed, well-nourished, pleasant and cooperative in NAD Neck:  Supple; no masses or thyromegaly. Lungs:  Clear throughout to auscultation.   No wheezes, crackles, or rhonchi. No acute distress. Heart:  Regular rate and rhythm; no murmurs, clicks, rubs,  or gallops. Abdomen:  Soft, nontender and nondistended. No masses, hepatosplenomegaly or hernias noted. Normal bowel sounds, without guarding, and without rebound.     Impression/Plan: [name] is now here to undergo a screening colonoscopy.  First ever average rescreening examination.  Risks, benefits, limitations, imponderables and alternatives regarding colonoscopy have been reviewed with the patient. Questions have been answered. All parties agreeable.     Notice:  This dictation was prepared with Dragon dictation along with smaller phrase technology. Any transcriptional errors that result from this process are unintentional and may not be corrected upon review.

## 2020-04-27 NOTE — Discharge Instructions (Signed)
  Colonoscopy Discharge Instructions  Read the instructions outlined below and refer to this sheet in the next few weeks. These discharge instructions provide you with general information on caring for yourself after you leave the hospital. Your doctor may also give you specific instructions. While your treatment has been planned according to the most current medical practices available, unavoidable complications occasionally occur. If you have any problems or questions after discharge, call Dr. Gala Romney at [phone]. ACTIVITY  You may resume your regular activity, but move at a slower pace for the next 24 hours.   Take frequent rest periods for the next 24 hours.   Walking will help get rid of the air and reduce the bloated feeling in your belly (abdomen).   No driving for 24 hours (because of the medicine (anesthesia) used during the test).    Do not sign any important legal documents or operate any machinery for 24 hours (because of the anesthesia used during the test).  NUTRITION  Drink plenty of fluids.   You may resume your normal diet as instructed by your doctor.   Begin with a light meal and progress to your normal diet. Heavy or fried foods are harder to digest and may make you feel sick to your stomach (nauseated).   Avoid alcoholic beverages for 24 hours or as instructed.  MEDICATIONS  You may resume your normal medications unless your doctor tells you otherwise.  WHAT YOU CAN EXPECT TODAY  Some feelings of bloating in the abdomen.   Passage of more gas than usual.   Spotting of blood in your stool or on the toilet paper.  IF YOU HAD POLYPS REMOVED DURING THE COLONOSCOPY:  No aspirin products for 7 days or as instructed.   No alcohol for 7 days or as instructed.   Eat a soft diet for the next 24 hours.  FINDING OUT THE RESULTS OF YOUR TEST Not all test results are available during your visit. If your test results are not back during the visit, make an appointment  with your caregiver to find out the results. Do not assume everything is normal if you have not heard from your caregiver or the medical facility. It is important for you to follow up on all of your test results.  SEEK IMMEDIATE MEDICAL ATTENTION IF:  You have more than a spotting of blood in your stool.   Your belly is swollen (abdominal distention).   You are nauseated or vomiting.   You have a temperature over 101.   You have abdominal pain or discomfort that is severe or gets worse throughout the day.   Your colonoscopy was normal today  It is recommended you return in 10 years for repeat screening examination  At patient request, I called Celesta Aver at [phone] and reviewed results

## 2020-04-27 NOTE — Op Note (Signed)
[facility] Patient Name: [name] Procedure Date: 04/27/2020 7:38 AM MRN: [id number] Date of Birth: 06-30-1966 Attending MD: Norvel Richards , MD CSN: [id number] Age: 54 Admit Type: Outpatient Procedure:                Colonoscopy Indications:              Screening for colorectal malignant neoplasm Providers:                Norvel Richards, MD, Otis Peak B. Sharon Seller, RN,                            Nelma Rothman, Technician Referring MD:              Medicines:                Midazolam 7 mg IV, Meperidine 50 mg IV Complications:            No immediate complications. Estimated Blood Loss:     Estimated blood loss: none. Procedure:                Pre-Anesthesia Assessment:                           - Prior to the procedure, a History and Physical                            was performed, and patient medications and                            allergies were reviewed. The patient's tolerance of                            previous anesthesia was also reviewed. The risks                            and benefits of the procedure and the sedation                            options and risks were discussed with the patient.                            All questions were answered, and informed consent                            was obtained. Prior Anticoagulants: The patient has                            taken no previous anticoagulant or antiplatelet                            agents. ASA Grade Assessment: II - A patient with                            mild systemic disease. After reviewing the risks  and benefits, the patient was deemed in                            satisfactory condition to undergo the procedure.                           After obtaining informed consent, the colonoscope                            was passed under direct vision. Throughout the                            procedure, the patient's blood pressure, pulse, and                             oxygen saturations were monitored continuously. The                            CF-[id number] ([id number]) scope was introduced through                            the anus and advanced to the the cecum, identified                            by appendiceal orifice and ileocecal valve. The                            colonoscopy was performed without difficulty. The                            patient tolerated the procedure well. The quality                            of the bowel preparation was adequate. Scope In: 7:57:49 AM Scope Out: 8:17:16 AM Scope Withdrawal Time: 0 hours 8 minutes 42 seconds  Total Procedure Duration: 0 hours 19 minutes 27 seconds  Findings:      The perianal and digital rectal examinations were normal.      The colon (entire examined portion) appeared normal. Left colon did       appear to be somewhat noncompliant.      The retroflexed view of the distal rectum and anal verge was normal and       showed no anal or rectal abnormalities. Impression:               - The entire examined colon is normal.                           - The distal rectum and anal verge are normal on                            retroflexion view.                           - No specimens collected. Moderate Sedation:      Moderate (  conscious) sedation was administered by the endoscopy nurse       and supervised by the endoscopist. The following parameters were       monitored: oxygen saturation, heart rate, blood pressure, respiratory       rate, EKG, adequacy of pulmonary ventilation, and response to care.       Total physician intraservice time was 34 minutes. Recommendation:           - Patient has a contact number available for                            emergencies. The signs and symptoms of potential                            delayed complications were discussed with the                            patient. Return to normal activities tomorrow.                            Written  discharge instructions were provided to the                            patient.                           - Resume previous diet.                           - Continue present medications.                           -Repeat colonoscopy for screening purposes in 10                            years Procedure Code(s):        --- Professional ---                           [phone], Colonoscopy, flexible; diagnostic, including                            collection of specimen(s) by brushing or washing,                            when performed (separate procedure)                           99153, Moderate sedation; each additional 15                            minutes intraservice time                           G0500, Moderate sedation services provided by the                            same  physician or other qualified health care                            professional performing a gastrointestinal                            endoscopic service that sedation supports,                            requiring the presence of an independent trained                            observer to assist in the monitoring of the                            patient's level of consciousness and physiological                            status; initial 15 minutes of intra-service time;                            patient age 27 years or older (additional time may                            be reported with [phone], as appropriate) Diagnosis Code(s):        --- Professional ---                           Z12.11, Encounter for screening for malignant                            neoplasm of colon CPT copyright 2019 American Medical Association. All rights reserved. The codes documented in this report are preliminary and upon coder review may  be revised to meet current compliance requirements. [name]. [name], MD Norvel Richards, MD 04/27/2020 8:41:59 AM This report has been signed electronically. Number of Addenda: 0

## 2020-05-02 NOTE — Progress Notes (Signed)
OK to schedule. ASA II 

## 2020-05-02 NOTE — Addendum Note (Signed)
Addended by: Mahala Menghini on: 05/02/2020 10:42 AM   Modules accepted: Orders

## 2020-05-03 ENCOUNTER — Encounter (HOSPITAL_COMMUNITY): Payer: Self-pay | Admitting: Internal Medicine

## 2020-07-12 ENCOUNTER — Encounter: Payer: Self-pay | Admitting: Family Medicine

## 2020-07-13 ENCOUNTER — Ambulatory Visit: Payer: BC Managed Care – PPO | Admitting: Internal Medicine

## 2020-07-17 NOTE — Telephone Encounter (Signed)
LVM for pt to call the office so I can make an appt

## 2020-07-18 ENCOUNTER — Telehealth: Payer: Self-pay

## 2020-07-18 NOTE — Telephone Encounter (Signed)
-----   Message from Fayrene Helper, MD sent at 06/24/2020 10:45 AM EDT ----- Regarding: pls reach out needs fasting labs and annual exam with lab review, no pap, also flu vaccine When scheduled pls let me know so I can send a message to nurse for lab order Remind her that mammogram is also due, please, thanks

## 2020-07-18 NOTE — Telephone Encounter (Signed)
LVM for the pt to call to schedule an appt

## 2020-08-23 ENCOUNTER — Other Ambulatory Visit: Payer: Self-pay | Admitting: Family Medicine

## 2020-09-04 IMAGING — MG DIGITAL SCREENING BILATERAL MAMMOGRAM WITH TOMO AND CAD
8 series · 8 of 24 positions shown · non-contrast
Comparison: Previous exam(s).

CLINICAL DATA: Screening.

EXAM:
DIGITAL SCREENING BILATERAL MAMMOGRAM WITH TOMO AND CAD

[R CC synth-2D]
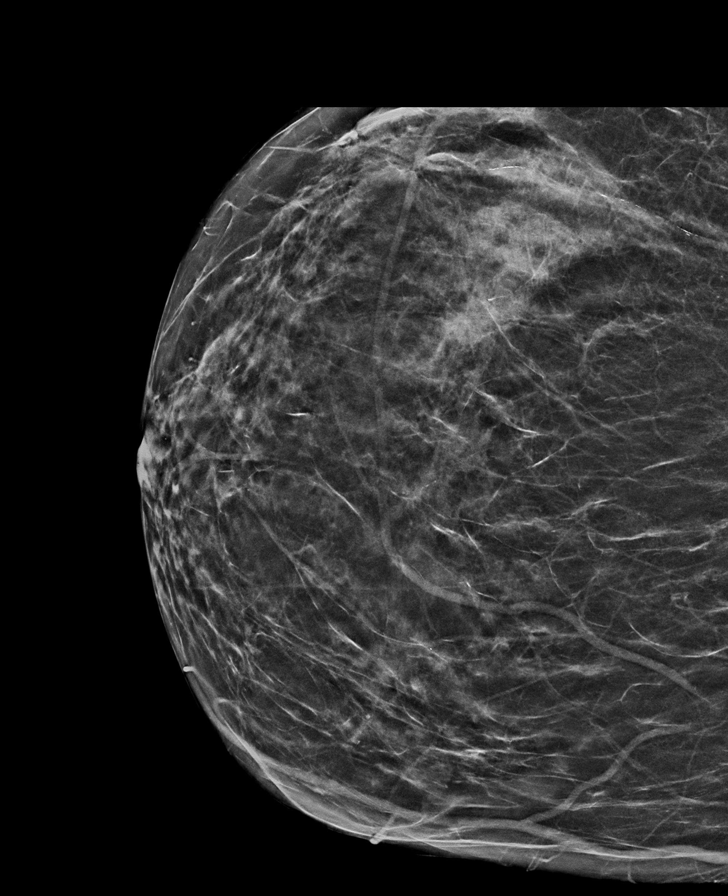

[L CC synth-2D]
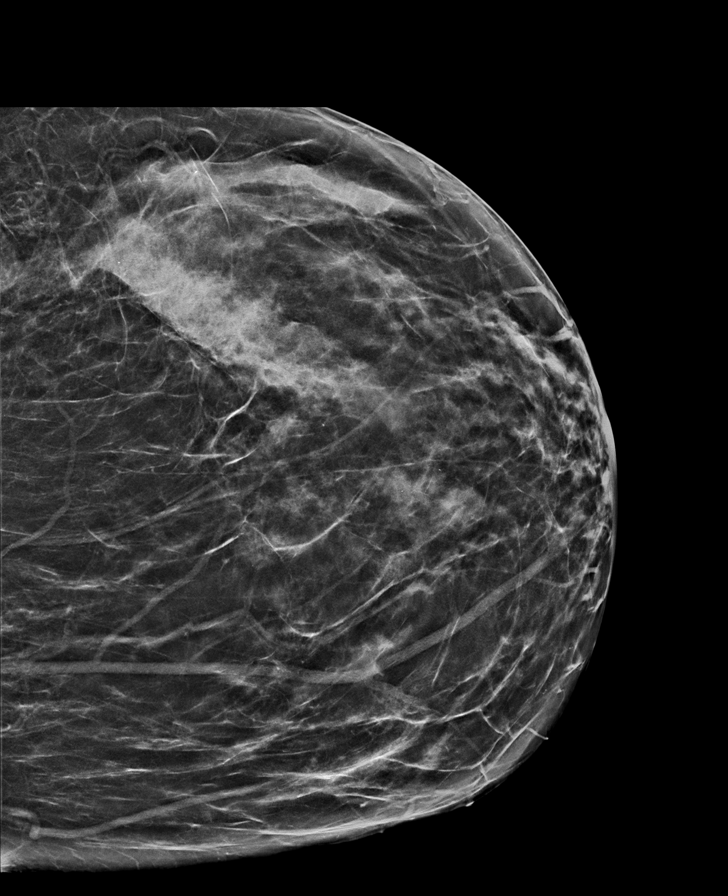

[L MLO synth-2D]
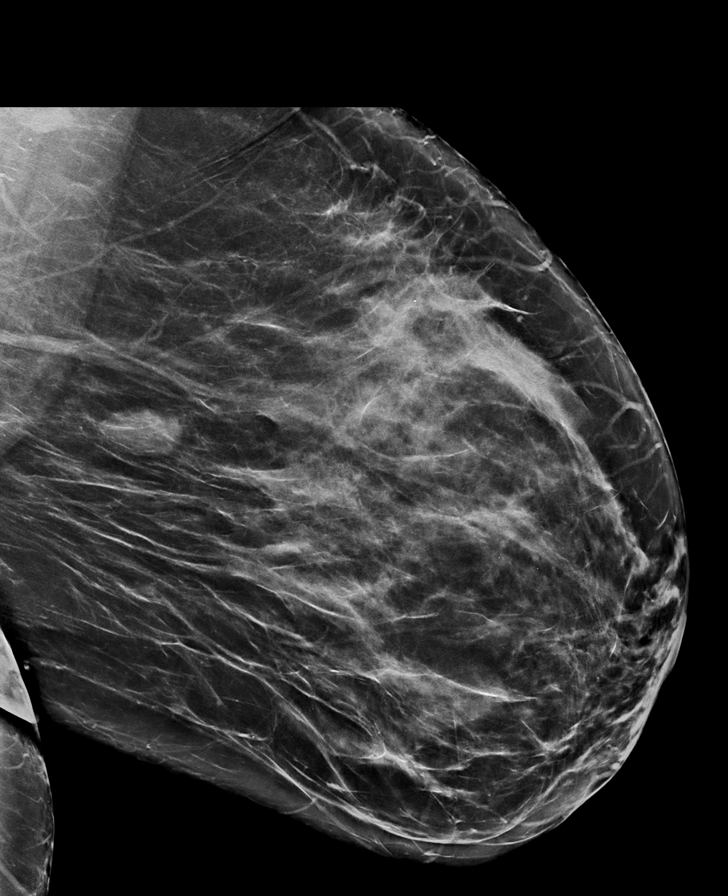

[R MLO synth-2D]
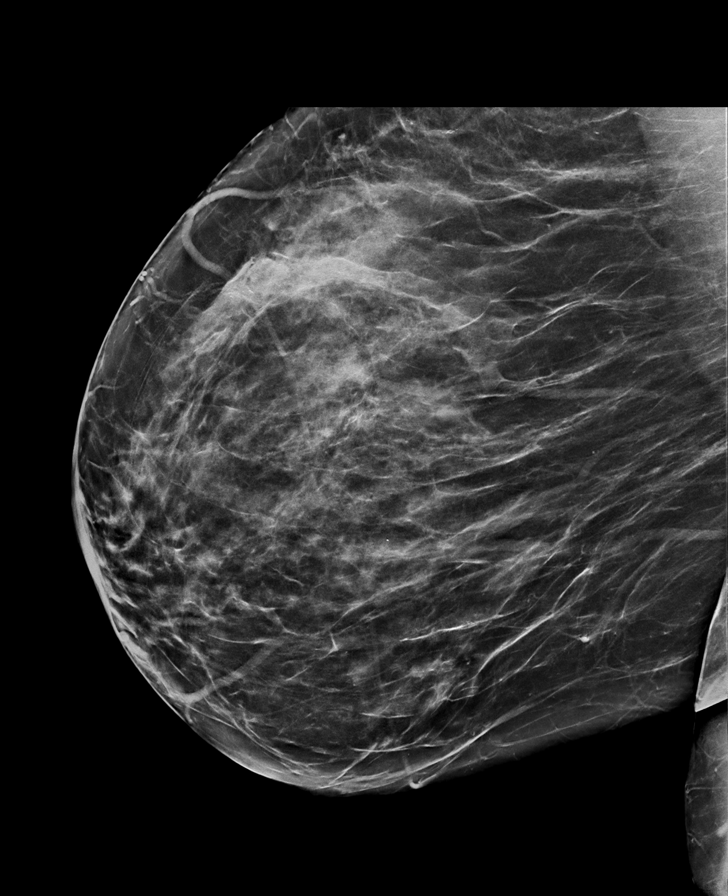

[R MLO tomo · tomo slice 39/78.0]
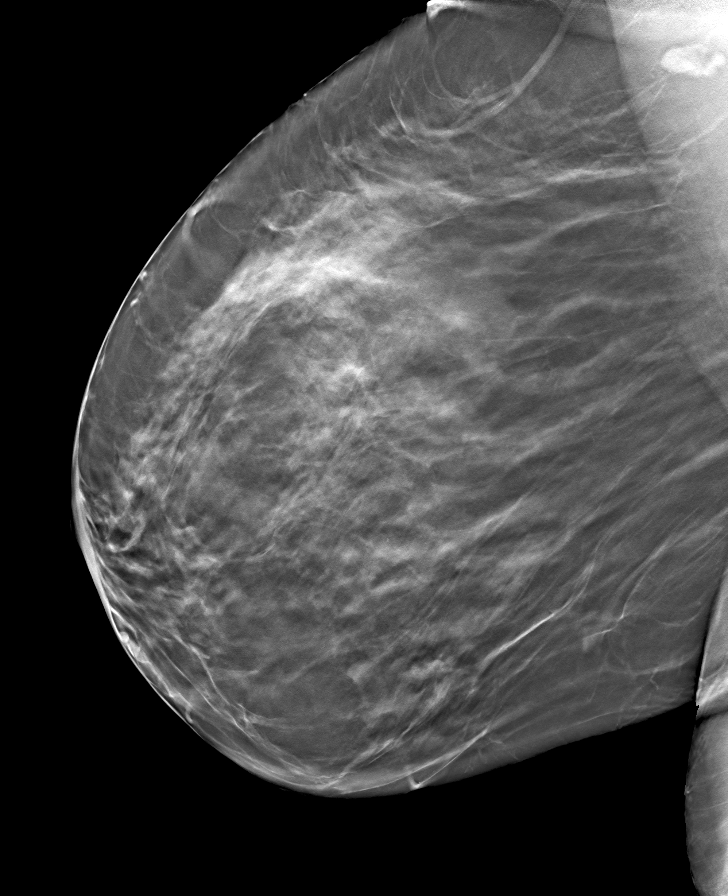

[R CC tomo · tomo slice 31/61.0]
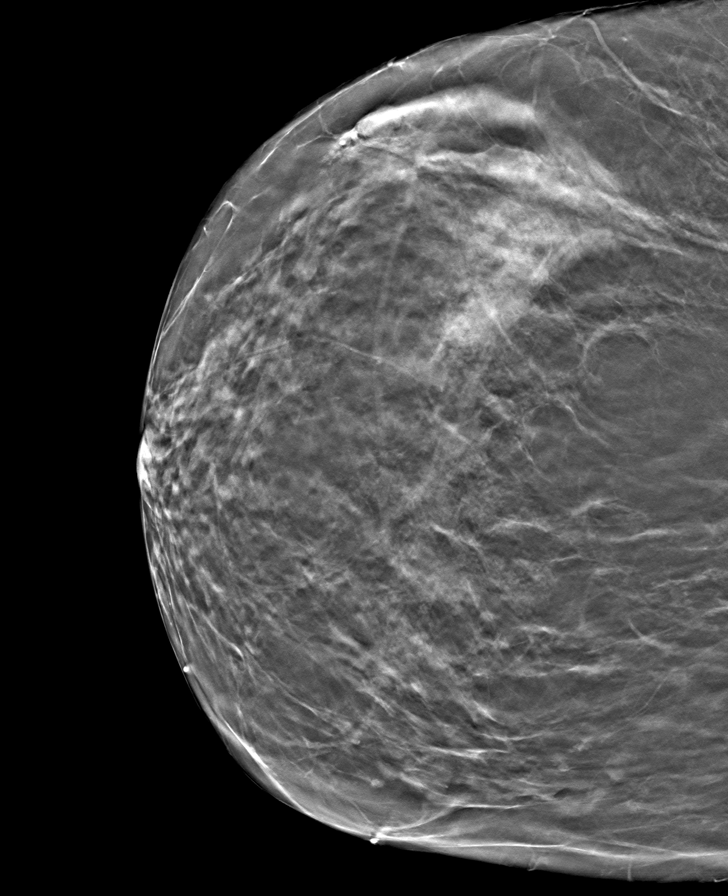

[L MLO tomo · tomo slice 42/83.0]
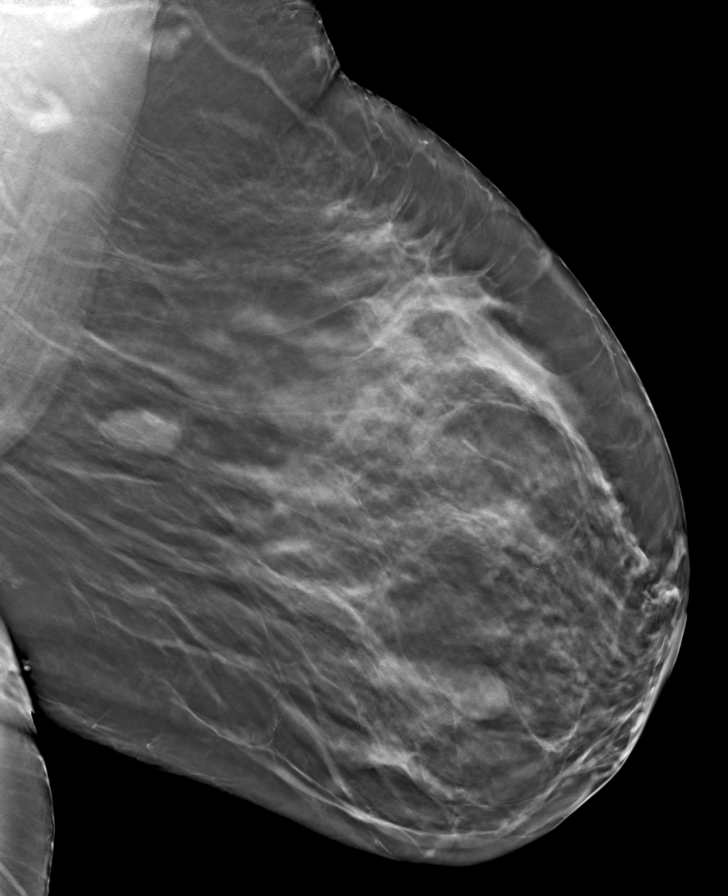

[L CC tomo · tomo slice 33/65.0]
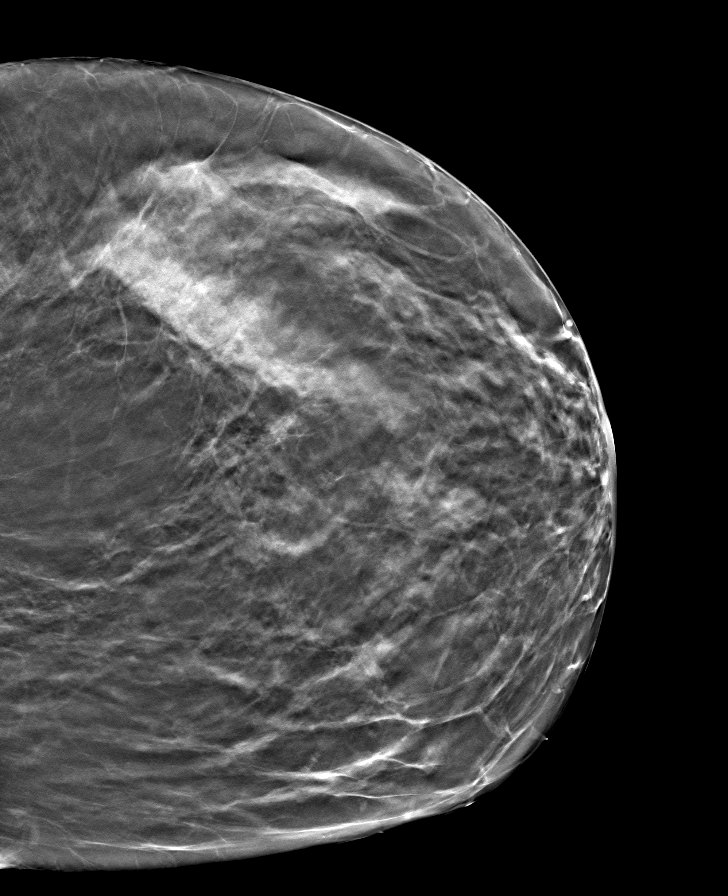

[8 of 24 positions shown; findings below may reference images not displayed]

ACR Breast Density Category c: The breast tissue is heterogeneously
dense, which may obscure small masses.
FINDINGS: There are no findings suspicious for malignancy. Images were
processed with CAD.
IMPRESSION: No mammographic evidence of malignancy. A result letter of this
screening mammogram will be mailed directly to the patient.

RECOMMENDATION:
Screening mammogram in one year. (Code:FT-U-LHB)

BI-RADS CATEGORY  1: Negative.

## 2020-09-05 IMAGING — US SOFT TISSUE ULTRASOUND HEAD/NECK
1 series · 13 of 25 positions shown · non-contrast
Comparison: 12/29/2014

CLINICAL DATA: Elevated TSH thyroid nodule

EXAM:
THYROID ULTRASOUND
TECHNIQUE: Ultrasound examination of the thyroid gland and adjacent soft
tissues was performed.

[Series 1: soft tissue ultrasound head/neck · 0.06mm/px · 13 of 61 slices shown]
[im 1/61]
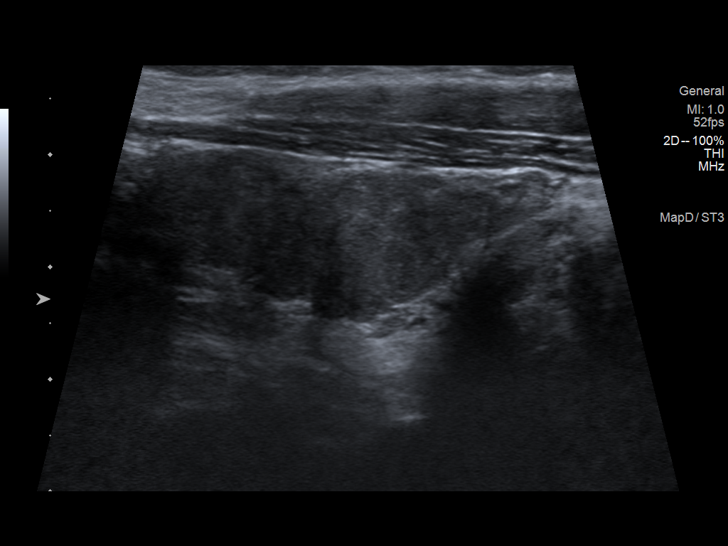
[im 6/61]
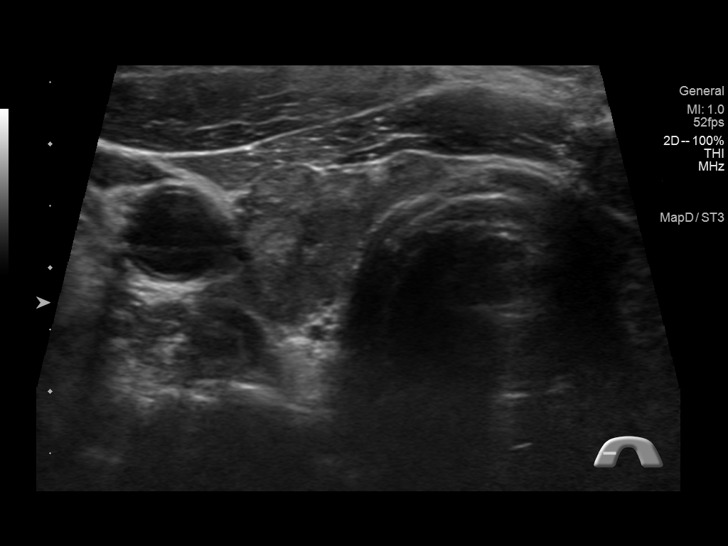
[im 11/61]
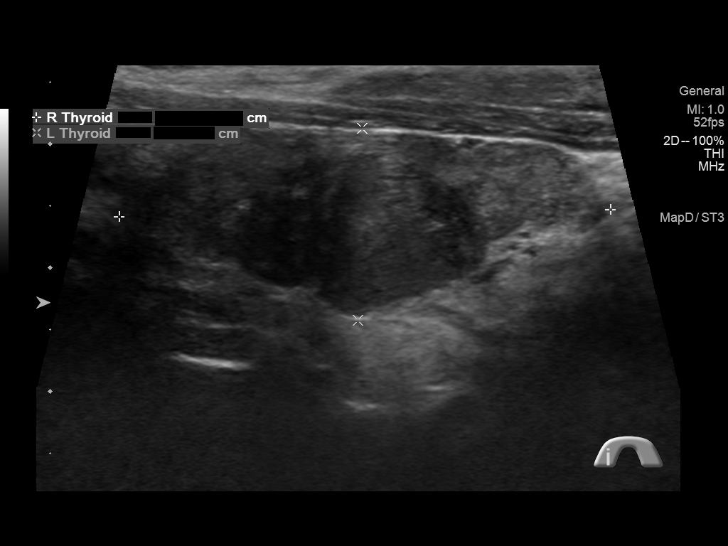
[im 16/61]
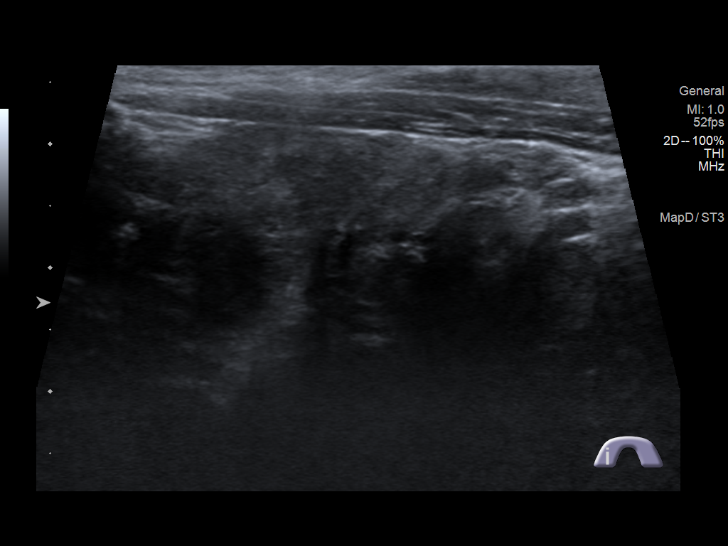
[im 21/61]
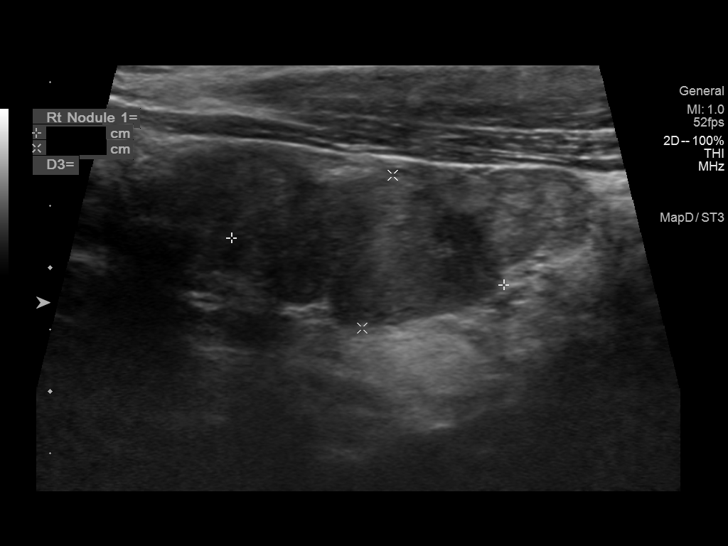
[im 26/61]
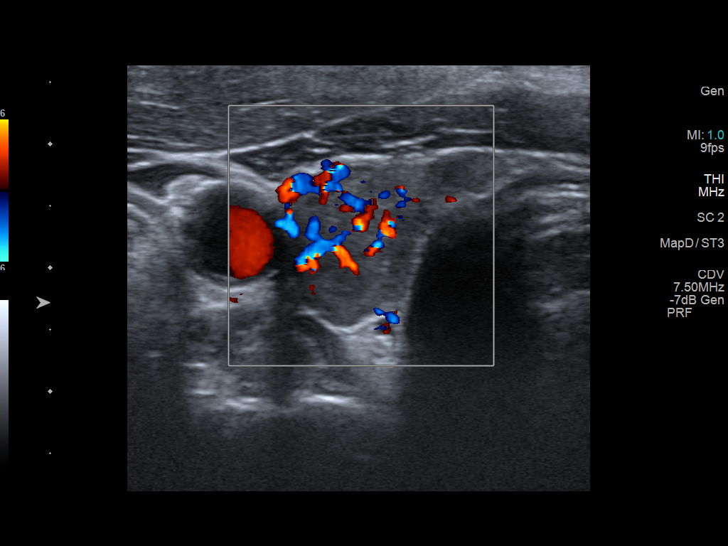
[im 31/61]
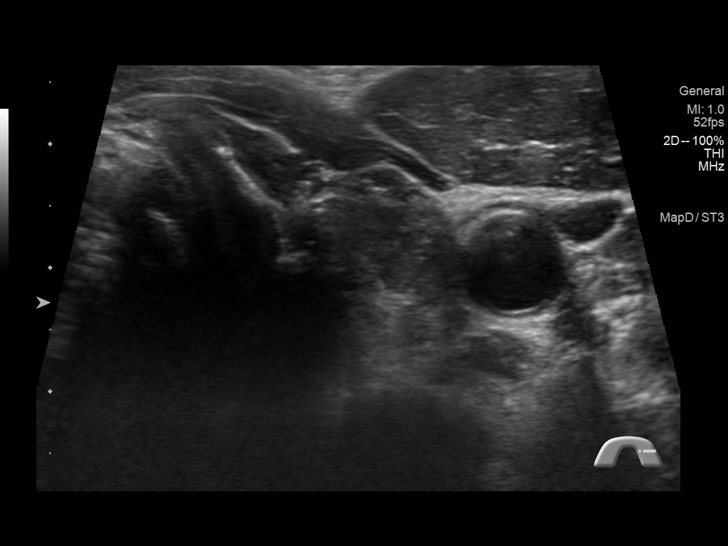
[im 36/61]
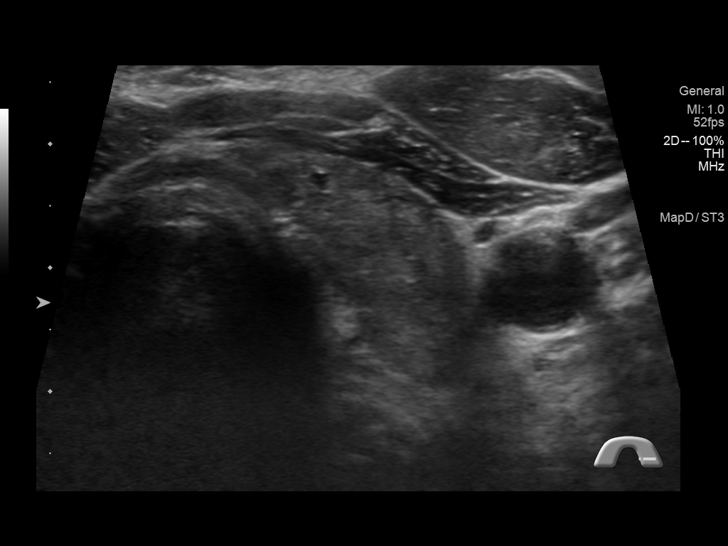
[im 41/61]
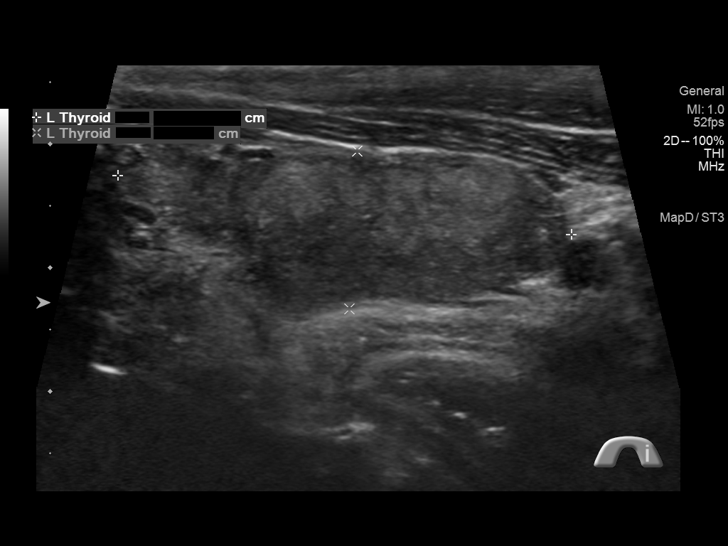
[im 46/61]
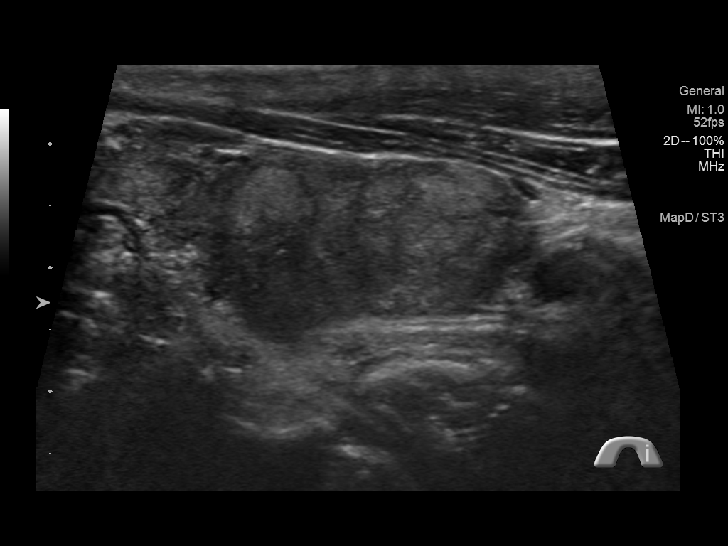
[im 51/61]
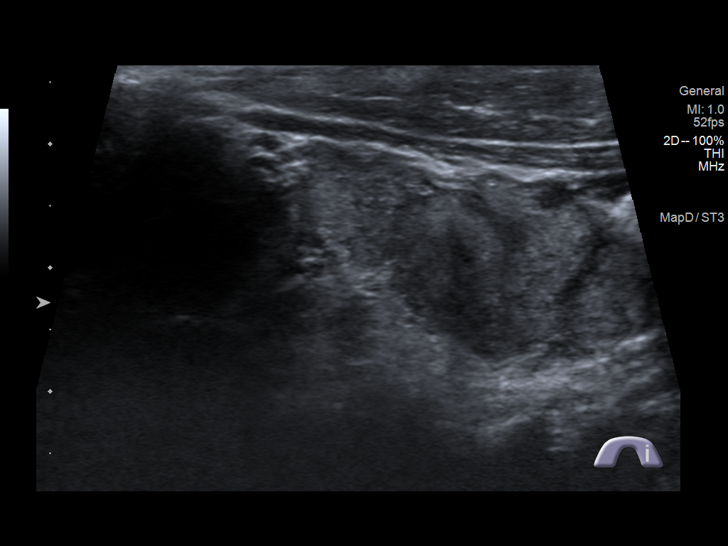
[im 56/61]
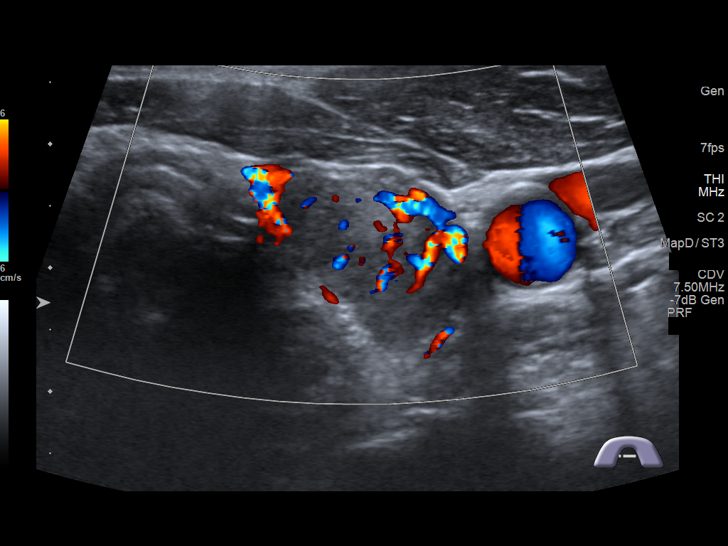
[im 61/61]
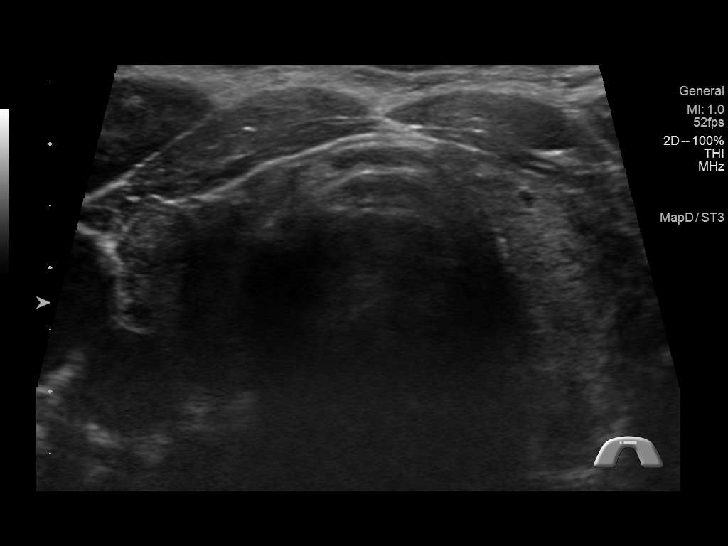

[13 of 25 positions shown; findings below may reference images not displayed]

FINDINGS: Parenchymal Echotexture: Moderately heterogenous

Isthmus: 2 mm

Right lobe: .  3.9 x 1.5 x 1.1 cm, previously 3.7 x 1.3 x 1.1 cm

Left lobe: 3.7 x 1.5 x 1.3 cm, previously 3.7 x 1.6 x 1.5 cm

_________________________________________________________

Estimated total number of nodules >/= 1 cm: 1

Number of spongiform nodules >/=  2 cm not described below (TR1): 0

Number of mixed cystic and solid nodules >/= 1.5 cm not described
below (TR2): 0

_________________________________________________________

Nodule # 1:

Location: Right; Mid

Maximum size: 2.2 cm; Other 2 dimensions: 1.2 x 0.9 cm

Composition: solid/almost completely solid (2)

Echogenicity: hypoechoic (2)

Shape: not taller-than-wide (0)

Margins: ill-defined (0)

Echogenic foci: none (0)

ACR TI-RADS total points: 4.

ACR TI-RADS risk category: TR4 (4-6 points).

ACR TI-RADS recommendations:

**Given size (>/= 1.5 cm) and appearance, fine needle aspiration of
this moderately suspicious nodule should be considered based on
TI-RADS criteria.

_________________________________________________________

Similar moderate gland heterogeneity and background pseudo
nodularity. No hypervascularity. No regional adenopathy.
IMPRESSION: 2.2 cm right mid thyroid TR 4 nodule meets criteria for biopsy as
above.

The above is in keeping with the ACR TI-RADS recommendations - [HOSPITAL] 6474;[DATE].

## 2020-09-24 ENCOUNTER — Other Ambulatory Visit: Payer: Self-pay | Admitting: Family Medicine

## 2020-11-07 ENCOUNTER — Other Ambulatory Visit: Payer: Self-pay | Admitting: Adult Health

## 2020-11-07 ENCOUNTER — Other Ambulatory Visit: Payer: Self-pay | Admitting: Family Medicine

## 2020-11-24 ENCOUNTER — Other Ambulatory Visit: Payer: Self-pay | Admitting: Adult Health

## 2020-11-28 ENCOUNTER — Encounter: Payer: Self-pay | Admitting: Family Medicine

## 2020-11-29 ENCOUNTER — Other Ambulatory Visit: Payer: Self-pay

## 2020-11-29 MED ORDER — BUDESONIDE-FORMOTEROL FUMARATE 160-4.5 MCG/ACT IN AERO: INHALATION_SPRAY | RESPIRATORY_TRACT | 3 refills | Status: DC

## 2020-11-29 MED ORDER — MONTELUKAST SODIUM 10 MG PO TABS: 10.0000 mg | ORAL_TABLET | Freq: Every day | ORAL | 3 refills | Status: DC

## 2020-12-20 ENCOUNTER — Other Ambulatory Visit: Payer: Self-pay | Admitting: Adult Health

## 2020-12-20 ENCOUNTER — Other Ambulatory Visit: Payer: Self-pay | Admitting: Family Medicine

## 2021-01-05 ENCOUNTER — Other Ambulatory Visit: Payer: Self-pay | Admitting: Family Medicine

## 2021-01-05 ENCOUNTER — Other Ambulatory Visit: Payer: Self-pay | Admitting: Adult Health

## 2021-02-05 ENCOUNTER — Encounter: Payer: Self-pay | Admitting: Family Medicine

## 2021-02-05 ENCOUNTER — Other Ambulatory Visit: Payer: Self-pay | Admitting: Family Medicine

## 2021-02-06 MED ORDER — AMLODIPINE BESYLATE 10 MG PO TABS: 1.0000 | ORAL_TABLET | Freq: Every day | ORAL | 0 refills | Status: DC

## 2021-02-08 ENCOUNTER — Other Ambulatory Visit: Payer: Self-pay

## 2021-02-08 ENCOUNTER — Encounter (HOSPITAL_COMMUNITY): Payer: Self-pay | Admitting: Emergency Medicine

## 2021-02-08 ENCOUNTER — Ambulatory Visit: Payer: BC Managed Care – PPO | Admitting: Nurse Practitioner

## 2021-02-08 ENCOUNTER — Emergency Department (HOSPITAL_COMMUNITY)
Admission: EM | Admit: 2021-02-08 | Discharge: 2021-02-08 | Disposition: A | Discharge status: Home / Self Care | Payer: Worker's Compensation | Attending: Emergency Medicine | Admitting: Emergency Medicine

## 2021-02-08 DIAGNOSIS — Z7951 Long term (current) use of inhaled steroids: Secondary | ICD-10-CM | POA: Insufficient documentation

## 2021-02-08 DIAGNOSIS — J4550 Severe persistent asthma, uncomplicated: Secondary | ICD-10-CM | POA: Diagnosis not present

## 2021-02-08 DIAGNOSIS — Y9263 Factory as the place of occurrence of the external cause: Secondary | ICD-10-CM | POA: Insufficient documentation

## 2021-02-08 DIAGNOSIS — Z23 Encounter for immunization: Secondary | ICD-10-CM | POA: Insufficient documentation

## 2021-02-08 DIAGNOSIS — Y990 Civilian activity done for income or pay: Secondary | ICD-10-CM | POA: Diagnosis not present

## 2021-02-08 DIAGNOSIS — E039 Hypothyroidism, unspecified: Secondary | ICD-10-CM | POA: Insufficient documentation

## 2021-02-08 DIAGNOSIS — Y280XXA Contact with sharp glass, undetermined intent, initial encounter: Secondary | ICD-10-CM | POA: Insufficient documentation

## 2021-02-08 DIAGNOSIS — Z91040 Latex allergy status: Secondary | ICD-10-CM | POA: Diagnosis not present

## 2021-02-08 DIAGNOSIS — S51811A Laceration without foreign body of right forearm, initial encounter: Secondary | ICD-10-CM | POA: Diagnosis present

## 2021-02-08 DIAGNOSIS — I10 Essential (primary) hypertension: Secondary | ICD-10-CM | POA: Diagnosis not present

## 2021-02-08 DIAGNOSIS — Z79899 Other long term (current) drug therapy: Secondary | ICD-10-CM | POA: Diagnosis not present

## 2021-02-08 MED ORDER — TETANUS-DIPHTH-ACELL PERTUSSIS 5-2.5-18.5 LF-MCG/0.5 IM SUSY
0.5000 mL | PREFILLED_SYRINGE | Freq: Once | INTRAMUSCULAR | Status: AC
  Administered 2021-02-08: 0.5 mL via INTRAMUSCULAR
  Filled 2021-02-08: qty 0.5

## 2021-02-08 MED ORDER — LIDOCAINE HCL (PF) 1 % IJ SOLN
5.0000 mL | Freq: Once | INTRAMUSCULAR | Status: DC
  Filled 2021-02-08: qty 30

## 2021-02-08 MED ORDER — BACITRACIN ZINC 500 UNIT/GM EX OINT
TOPICAL_OINTMENT | Freq: Once | CUTANEOUS | Status: AC
  Filled 2021-02-08: qty 0.9

## 2021-02-08 MED ORDER — POVIDONE-IODINE 10 % EX SOLN
CUTANEOUS | Status: AC
  Filled 2021-02-08: qty 15

## 2021-02-08 NOTE — ED Triage Notes (Signed)
Pt c/o laceration to right forearm after cutting it on glass at work tonight. Laceration is wrapped at this time

## 2021-02-08 NOTE — ED Provider Notes (Signed)
[facility] EMERGENCY DEPARTMENT Provider Note   CSN: [id number] Arrival date & time: 02/08/21  0309     History Chief Complaint  Patient presents with  . Laceration    [name] is a 55 y.o. female.  Patient is a 55 year old female with history of hypertension and asthma.  She presents today for evaluation of a right arm laceration.  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  Last tetanus unknown.  The history is provided by the patient.  Laceration Location: Right forearm. Depth:  Through underlying tissue Quality: straight   Bleeding: controlled   Laceration mechanism:  Broken glass Pain details:    Severity:  Mild   Timing:  Constant   Progression:  Unchanged Relieved by:  Nothing Worsened by:  Nothing Ineffective treatments:  None tried Tetanus status:  Unknown      Past Medical History:  Diagnosis Date  . Asthma 2010  . Eczema since childhoosd   flare since 2011  . Fibroids   . Hypertension   . Menorrhagia     Patient Active Problem List   Diagnosis Date Noted  . Fibroids 07/19/2019  . Annual physical exam 05/11/2019  . Menorrhagia with regular cycle 04/10/2019  . Left carpal tunnel syndrome 03/18/2017  . Severe persistent asthma 04/15/2016  . Abnormal thyroid ultrasound 12/29/2014  . Hyperlipidemia LDL goal <100 12/22/2014  . Prediabetes 12/22/2014  . Low back pain 09/28/2013  . Baker's cyst 10/28/2011  . PEPTIC ULCER DISEASE, HELICOBACTER PYLORI POSITIVE 11/12/2010  . Hypothyroidism 10/30/2010  . SCIATICA, LEFT 01/18/2010  . Eczema 12/31/2009  . Morbid obesity ([place]) 11/02/2008  . Essential hypertension 11/02/2008  . Allergic rhinitis 11/02/2008  . DEGENERATIVE JOINT DISEASE, LEFT KNEE 11/02/2008    Past Surgical History:  Procedure Laterality Date  . ANKLE SURGERY    . BACK SURGERY    . COLONOSCOPY N/A 04/27/2020   Procedure: COLONOSCOPY;  Surgeon: Daneil Dolin, MD;  Location: [facility]  ENDO SUITE;  Service: Endoscopy;  Laterality: N/A;  7:30  . KNEE SURGERY    . TUBAL LIGATION       OB History    Gravida  1   Para  1   Term  1   Preterm      AB      Living  1     SAB      IAB      Ectopic      Multiple      Live Births              Family History  Problem Relation Age of Onset  . Asthma Paternal Aunt   . Asthma Paternal Uncle   . Liver disease Paternal Grandfather   . Aneurysm Maternal Grandfather        brain  . Heart attack Father     Social History   Tobacco Use  . Smoking status: Never Smoker  . Smokeless tobacco: Never Used  Vaping Use  . Vaping Use: Never used  Substance Use Topics  . Alcohol use: Yes    Comment: 1 glass of wine twice a week  . Drug use: No    Home Medications Prior to Admission medications   Medication Sig Start Date End Date Taking? Authorizing Provider  amLODipine (NORVASC) 10 MG tablet Take 1 tablet (10 mg total) by mouth daily. 02/06/21   Fayrene Helper, MD  amLODipine ([place]andas) 10  MG tablet Take 1 tablet (10 mg total) by mouth daily. 02/06/21   Fayrene Helper, MD  budesonide-formoterol (SYMBICORT) 160-4.5 MCG/ACT inhaler INHALE 2 PUFFS INTO THEL LUNGS 2 TIMES A DAY 09/12/19   Fayrene Helper, MD  budesonide-formoterol [facility]) 160-4.5 MCG/ACT inhaler INHALE 2 PUFFS INTO THEL LUNGS 2 TIMES A DAY 11/29/20   Fayrene Helper, MD  hydrOXYzine (ATARAX/VISTARIL) 25 MG tablet Take 2-3 tabs at bedtime Patient taking differently: Take 25-75 mg by mouth at bedtime as needed for itching.  04/05/19   Fayrene Helper, MD  levothyroxine (SYNTHROID) 100 MCG tablet TAKE 1 TABLET BY MOUTH EVERY DAY 01/07/21   Fayrene Helper, MD  megestrol (MEGACE) 40 MG tablet TAKE 1 TABLET BY MOUTH EVERY DAY Patient taking differently: Take 40 mg by mouth daily.  03/07/20   Estill Dooms, NP  montelukast (SINGULAIR) 10 MG tablet Take 1 tablet (10 mg total) by mouth daily. 11/29/20   Fayrene Helper, MD   triamcinolone cream (KENALOG) 0.1 % APPLY TO AFFECTED AREA ON BODY (NOT FACE) TWICE DAILY FOR 1-2 WEEKS Patient taking differently: Apply 1 application topically daily.  02/13/20   Fayrene Helper, MD    Allergies    Bactrim [sulfamethoxazole-trimethoprim], Penicillins, and Latex  Review of Systems   Review of Systems  All other systems reviewed and are negative.   Physical Exam Updated Vital Signs BP (!) 176/106   Pulse 76   Resp 18   Ht 5\' 4"  (1.626 m)   Wt 90.7 kg   SpO2 99%   BMI 34.33 kg/m   Physical Exam Vitals and nursing note reviewed.  Constitutional:      General: She is not in acute distress.    Appearance: Normal appearance. She is not ill-appearing.  HENT:     Head: Normocephalic and atraumatic.  Pulmonary:     Effort: Pulmonary effort is normal.  Musculoskeletal:     Comments: There is a 4.5 cm laceration to the volar aspect of the right forearm.  The wound runs parallel to the forearm.  There is no tendon involvement.  She has full range of motion of the fingers, wrist.  Ulnar and radial pulses are easily palpable and capillary refill is brisk to all fingers.  Skin:    General: Skin is warm and dry.  Neurological:     Mental Status: She is alert.     ED Results / Procedures / Treatments   Labs (all labs ordered are listed, but only abnormal results are displayed) Labs Reviewed - No data to display  EKG None  Radiology No results found.  Procedures Procedures   Medications Ordered in ED Medications  lidocaine (PF) (XYLOCAINE) 1 % injection 5 mL (has no administration in time range)  povidone-iodine (BETADINE) 10 % external solution (has no administration in time range)  bacitracin ointment (has no administration in time range)  Tdap (BOOSTRIX) injection 0.5 mL (has no administration in time range)    ED Course  I have reviewed the triage vital signs and the nursing notes.  Pertinent labs & imaging results that were available during  my care of the patient were reviewed by me and considered in my medical decision making (see chart for details).    MDM Rules/Calculators/A&P  Laceration repaired as below.  Patient to apply bacitracin and local wound care.  She is to follow-up for suture removal in 10 days.  LACERATION REPAIR Performed by: Veryl Speak Authorized by: Veryl Speak Consent: Verbal  consent obtained. Risks and benefits: risks, benefits and alternatives were discussed Consent given by: patient Patient identity confirmed: provided demographic data Prepped and Draped in normal sterile fashion Wound explored  Laceration Location: right forearm  Laceration Length: 4.5cm  No Foreign Bodies seen or palpated  Anesthesia: local infiltration  Local anesthetic: lidocaine 1% without epinephrine  Anesthetic total: 5 ml  Irrigation method: syringe Amount of cleaning: standard  Skin closure: 4-0 prolene  Number of sutures: 7  Technique: simple interrupted  Patient tolerance: Patient tolerated the procedure well with no immediate complications.   Final Clinical Impression(s) / ED Diagnoses Final diagnoses:  None    Rx / DC Orders ED Discharge Orders    None       Veryl Speak, MD 02/08/21 0401

## 2021-02-08 NOTE — Discharge Instructions (Addendum)
Local wound care with bacitracin and dressing changes twice daily.  Follow-up with primary doctor in 10 days for suture removal.  Return to the emergency department in the meantime if you develop increased swelling, redness, pus draining from the wound, red streaks up or down the arm, or other new and concerning symptoms.

## 2021-02-21 ENCOUNTER — Ambulatory Visit (INDEPENDENT_AMBULATORY_CARE_PROVIDER_SITE_OTHER): Payer: BC Managed Care – PPO | Admitting: Nurse Practitioner

## 2021-02-21 ENCOUNTER — Other Ambulatory Visit: Payer: Self-pay

## 2021-02-21 ENCOUNTER — Encounter: Payer: Self-pay | Admitting: Nurse Practitioner

## 2021-02-21 VITALS — BP 137/101 | HR 86 | Temp 98.6°F | Resp 20 | Ht 64.5 in | Wt 231.0 lb

## 2021-02-21 DIAGNOSIS — J309 Allergic rhinitis, unspecified: Secondary | ICD-10-CM

## 2021-02-21 DIAGNOSIS — Z4802 Encounter for removal of sutures: Secondary | ICD-10-CM | POA: Diagnosis not present

## 2021-02-21 DIAGNOSIS — Z139 Encounter for screening, unspecified: Secondary | ICD-10-CM

## 2021-02-21 DIAGNOSIS — E038 Other specified hypothyroidism: Secondary | ICD-10-CM

## 2021-02-21 DIAGNOSIS — Z0001 Encounter for general adult medical examination with abnormal findings: Secondary | ICD-10-CM | POA: Diagnosis not present

## 2021-02-21 DIAGNOSIS — R7301 Impaired fasting glucose: Secondary | ICD-10-CM

## 2021-02-21 DIAGNOSIS — Z0000 Encounter for general adult medical examination without abnormal findings: Secondary | ICD-10-CM

## 2021-02-21 MED ORDER — MONTELUKAST SODIUM 10 MG PO TABS: 10.0000 mg | ORAL_TABLET | Freq: Every day | ORAL | 3 refills | Status: DC

## 2021-02-21 MED ORDER — LEVOTHYROXINE SODIUM 100 MCG PO TABS: 100.0000 ug | ORAL_TABLET | Freq: Every day | ORAL | 2 refills | Status: DC

## 2021-02-21 NOTE — Assessment & Plan Note (Signed)
-  7 sutures removed from right forearm without incident -no sign of infection

## 2021-02-21 NOTE — Patient Instructions (Signed)
Please have fasting labs drawn 2-3 days prior to your appointment so we can discuss the results during your office visit.  

## 2021-02-21 NOTE — Progress Notes (Signed)
Acute Office Visit  Subjective:    Patient ID: [name], female    DOB: [birth year], 55 y.o.   MRN: [id number]  Chief Complaint  Patient presents with  . Suture / Staple Removal    R forearm, stitch removal.   . Medication Refill    HPI Patient is in today for med refill and suture removal.  She went to ED on 02/08/21 for right arm laceration.  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  She had 7 sutures placed at the ED and got a TDaP immunization at that time.  Past Medical History:  Diagnosis Date  . Asthma 2010  . Eczema since childhoosd   flare since 2011  . Fibroids   . Hypertension   . Menorrhagia     Past Surgical History:  Procedure Laterality Date  . ANKLE SURGERY    . BACK SURGERY    . COLONOSCOPY N/A 04/27/2020   Procedure: COLONOSCOPY;  Surgeon: Daneil Dolin, MD;  Location: [facility] ENDO SU[facility];  Service: Endoscopy;  Laterality: N/A;  7:30  . KNEE SURGERY    . TUBAL LIGATION      Family History  Problem Relation Age of Onset  . Asthma Paternal Aunt   . Asthma Paternal Uncle   . Liver disease Paternal Grandfather   . Aneurysm Maternal Grandfather        brain  . Heart attack Father     Social History   Socioeconomic History  . Marital status: Married    Spouse name: Not on file  . Number of children: Not on file  . Years of education: Not on file  . Highest education level: Not on file  Occupational History  . Occupation: works with chemicals  Tobacco Use  . Smoking status: Never Smoker  . Smokeless tobacco: Never Used  Vaping Use  . Vaping Use: Never used  Substance and Sexual Activity  . Alcohol use: Yes    Comment: 1 glass of wine twice a week  . Drug use: No  . Sexual activity: Yes    Birth control/protection: Surgical    Comment: tubal  Other Topics Concern  . Not on file  Social History Narrative  . Not on file   Social Determinants of Health   Financial Resource Strain: Not on file   Food Insecurity: Not on file  Transportation Needs: Not on file  Physical Activity: Not on file  Stress: Not on file  Social Connections: Not on file  Intimate Partner Violence: Not on file    Outpatient Medications Prior to Visit  Medication Sig Dispense Refill  . amLODipine (NORVASC) 10 MG tablet Take 1 tablet (10 mg total) by mouth daily. 90 tablet 0  . amLODipine (NORVASC) 10 MG tablet Take 1 tablet (10 mg total) by mouth daily. 90 tablet 0  . budesonide-formoterol (SYMBICORT) 160-4.5 MCG/ACT inhaler INHALE 2 PUFFS INTO THEL LUNGS 2 TIMES A DAY 10.2 Inhaler 4  . budesonide-formoterol (SYMBICORT) 160-4.5 MCG/ACT inhaler INHALE 2 PUFFS INTO THEL LUNGS 2 TIMES A DAY 30.6 each 3  . hydrOXYzine (ATARAX/VISTARIL) 25 MG tablet Take 2-3 tabs at bedtime (Patient taking differently: Take 25-75 mg by mouth at bedtime as needed for itching.) 270 tablet 1  . megestrol (MEGACE) 40 MG tablet TAKE 1 TABLET BY MOUTH EVERY DAY (Patient taking differently: Take 40 mg by mouth daily.) 30 tablet 5  . triamcinolone cream (KENALOG) 0.1 % APPLY TO AFFECTED  AREA ON BODY (NOT FACE) TWICE DAILY FOR 1-2 WEEKS (Patient taking differently: Apply 1 application topically daily.) 454 g 3  . levothyroxine (SYNTHROID) 100 MCG tablet TAKE 1 TABLET BY MOUTH EVERY DAY 60 tablet 2  . montelukast (SINGULAIR) 10 MG tablet Take 1 tablet (10 mg total) by mouth daily. 90 tablet 3   No facility-administered medications prior to visit.    Allergies  Allergen Reactions  . Bactrim [Sulfamethoxazole-Trimethoprim] Other (See Comments)    Numbness, mouth pain, tightness in throat  . Penicillins     Has patient had a PCN reaction causing immediate rash, facial/tongue/throat swelling, SOB or lightheadedness with hypotension: Yes Has patient had a PCN reaction causing severe rash involving mucus membranes or skin necrosis: No Has patient had a PCN reaction that required hospitalization No Has patient had a PCN reaction occurring  within the last 10 years: NoN  If all of the above answers are "NO", then may proceed with Cephalosporin use.     REACTION: At age 62 had heel sore and was put on R  . Latex Rash    Review of Systems  Constitutional: Negative.   Respiratory: Negative.   Cardiovascular: Negative.   Skin:       Laceration to R forearm       Objective:    Physical Exam Constitutional:      Appearance: Normal appearance.  Skin:    Comments: Laceration well approximated  Neurological:     Mental Status: She is alert.     BP (!) 137/101   Pulse 86   Temp 98.6 F (37 C)   Resp 20   Ht 5' 4.5" (1.638 m)   Wt 231 lb (104.8 kg)   SpO2 95%   BMI 39.04 kg/m  Wt Readings from Last 3 Encounters:  02/21/21 231 lb (104.8 kg)  02/08/21 200 lb (90.7 kg)  07/19/19 216 lb 8 oz (98.2 kg)    Health Maintenance Due  Topic Date Due  . Hepatitis C Screening  Never done  . COVID-19 Vaccine (3 - Booster for Moderna series) 06/26/2020    There are no preventive care reminders to display for this patient.   Lab Results  Component Value Date   TSH 16.02 (H) 05/11/2019   Lab Results  Component Value Date   WBC 6.7 05/11/2019   HGB 14.3 05/11/2019   HCT 43.2 05/11/2019   MCV 84.5 05/11/2019   PLT 360 05/11/2019   Lab Results  Component Value Date   NA 138 05/11/2019   K 4.4 05/11/2019   CO2 26 05/11/2019   GLUCOSE 87 05/11/2019   BUN 13 05/11/2019   CREATININE 0.81 05/11/2019   BILITOT 0.6 05/11/2019   ALKPHOS 70 04/14/2016   AST 19 05/11/2019   ALT 23 05/11/2019   PROT 7.3 05/11/2019   ALBUMIN 3.9 04/14/2016   CALCIUM 9.7 05/11/2019   ANIONGAP 9 12/09/2016   Lab Results  Component Value Date   CHOL 208 (H) 05/11/2019   Lab Results  Component Value Date   HDL 60 05/11/2019   Lab Results  Component Value Date   LDLCALC 126 (H) 05/11/2019   Lab Results  Component Value Date   TRIG 108 05/11/2019   Lab Results  Component Value Date   CHOLHDL 3.5 05/11/2019   Lab  Results  Component Value Date   HGBA1C 5.7 (H) 05/11/2019       Assessment & Plan:   Problem List Items Addressed This Visit  Respiratory   Allergic rhinitis   Relevant Medications   montelukast (SINGULAIR) 10 MG tablet     Endocrine   Hypothyroidism - Primary   Relevant Medications   levothyroxine (SYNTHROID) 100 MCG tablet   Other Relevant Orders   TSH + free T4     Other   Annual physical exam   Relevant Orders   CBC with Differential/Platelet   CMP14+EGFR   Lipid Panel With LDL/HDL Ratio   Hepatitis C antibody   Hemoglobin A1c   TSH + free T4   Visit for suture removal    -7 sutures removed from right forearm without incident -no sign of infection       Other Visit Diagnoses    IFG (impaired fasting glucose)       Relevant Orders   Hemoglobin A1c   Screening due       Relevant Orders   Hepatitis C antibody       Meds ordered this encounter  Medications  . montelukast (SINGULAIR) 10 MG tablet    Sig: Take 1 tablet (10 mg total) by mouth daily.    Dispense:  90 tablet    Refill:  3  . levothyroxine (SYNTHROID) 100 MCG tablet    Sig: Take 1 tablet (100 mcg total) by mouth daily.    Dispense:  60 tablet    Refill:  2     Noreene Larsson, NP

## 2021-04-08 ENCOUNTER — Other Ambulatory Visit: Payer: Self-pay

## 2021-04-08 ENCOUNTER — Encounter: Payer: Self-pay | Admitting: Family Medicine

## 2021-04-08 MED ORDER — TRIAMCINOLONE ACETONIDE 0.1 % EX CREA: TOPICAL_CREAM | CUTANEOUS | 3 refills | Status: DC

## 2021-04-23 ENCOUNTER — Emergency Department (HOSPITAL_COMMUNITY)
Admission: EM | Admit: 2021-04-23 | Discharge: 2021-04-23 | Disposition: A | Discharge status: Home / Self Care | Payer: BC Managed Care – PPO | Attending: Emergency Medicine | Admitting: Emergency Medicine

## 2021-04-23 ENCOUNTER — Emergency Department (HOSPITAL_COMMUNITY): Payer: BC Managed Care – PPO

## 2021-04-23 ENCOUNTER — Encounter (HOSPITAL_COMMUNITY): Payer: Self-pay | Admitting: Emergency Medicine

## 2021-04-23 ENCOUNTER — Other Ambulatory Visit: Payer: Self-pay

## 2021-04-23 DIAGNOSIS — J4550 Severe persistent asthma, uncomplicated: Secondary | ICD-10-CM | POA: Insufficient documentation

## 2021-04-23 DIAGNOSIS — Z91040 Latex allergy status: Secondary | ICD-10-CM | POA: Insufficient documentation

## 2021-04-23 DIAGNOSIS — Z79899 Other long term (current) drug therapy: Secondary | ICD-10-CM | POA: Diagnosis not present

## 2021-04-23 DIAGNOSIS — Z7951 Long term (current) use of inhaled steroids: Secondary | ICD-10-CM | POA: Insufficient documentation

## 2021-04-23 DIAGNOSIS — R079 Chest pain, unspecified: Secondary | ICD-10-CM | POA: Diagnosis not present

## 2021-04-23 DIAGNOSIS — I10 Essential (primary) hypertension: Secondary | ICD-10-CM | POA: Diagnosis not present

## 2021-04-23 DIAGNOSIS — E039 Hypothyroidism, unspecified: Secondary | ICD-10-CM | POA: Diagnosis not present

## 2021-04-23 LAB — BASIC METABOLIC PANEL
Anion gap: 7 (ref 5–15)
BUN: 15 mg/dL (ref 6–20)
CO2: 30 mmol/L (ref 22–32)
Calcium: 9.2 mg/dL (ref 8.9–10.3)
Chloride: 99 mmol/L (ref 98–111)
Creatinine, Ser: 0.89 mg/dL (ref 0.44–1.00)
GFR, Estimated: >60 mL/min (ref >60)
Glucose, Bld: 89 mg/dL (ref 70–99)
Potassium: 3.9 mmol/L (ref 3.5–5.1)
Sodium: 136 mmol/L (ref 135–145)

## 2021-04-23 LAB — CBC
HCT: 45.6 % (ref 36.0–46.0)
Hemoglobin: 14.6 g/dL (ref 12.0–15.0)
MCH: 28.9 pg (ref 26.0–34.0)
MCHC: 32 g/dL (ref 30.0–36.0)
MCV: 90.3 fL (ref 80.0–100.0)
Platelets: 332 10*3/uL (ref 150–400)
RBC: 5.05 MIL/uL (ref 3.87–5.11)
RDW: 14 % (ref 11.5–15.5)
WBC: 8.5 10*3/uL (ref 4.0–10.5)
nRBC: 0 % (ref 0.0–0.2)

## 2021-04-23 LAB — TROPONIN I (HIGH SENSITIVITY)
Troponin I (High Sensitivity): 4 ng/L (ref <18)
Troponin I (High Sensitivity): 4 ng/L (ref <18)

## 2021-04-23 MED ORDER — ASPIRIN 81 MG PO CHEW: 324.0000 mg | CHEWABLE_TABLET | Freq: Once | ORAL | Status: DC

## 2021-04-23 NOTE — Discharge Instructions (Addendum)
As discussed, follow-up with your primary care provider for recheck.  Also I have provided follow-up information for the local cardiology office.  Return to the emergency department for any new or worsening symptoms or if your chest pain returns.

## 2021-04-23 NOTE — ED Provider Notes (Signed)
[facility] EMERGENCY DEPARTMENT Provider Note   CSN: [id number] Arrival date & time: 04/23/21  1604     History Chief Complaint  Patient presents with   Chest Pain    [name] is a 56 y.o. female.   Chest Pain Associated symptoms: no abdominal pain, no back pain, no cough, no diaphoresis, no dizziness, no dysphagia, no fatigue, no fever, no headache, no nausea, no numbness, no palpitations, no shortness of breath, no vomiting and no weakness       [name] is a 55 y.o. female who presents to the Emergency Department complaining of left-sided chest pain while at work this morning.  Around 10 AM patient states that she noticed pain to her left upper chest area.  Describes the pain as somewhat sharp in nature.  She took 1 aspirin at onset of her pain.  Chest pain lasted approximately 1 hour and had resolved just before arrival to the ER.  She believes that she "slept wrong" and may have pulled a muscle.  She denies arm pain, nausea, abdominal pain, shortness of breath, neck or jaw pain.  No history of cardiac disease.  No recent illness, fever or chills.  No cough.   Past Medical History:  Diagnosis Date   Asthma 2010   Eczema since childhoosd   flare since 2011   Fibroids    Hypertension    Menorrhagia     Patient Active Problem List   Diagnosis Date Noted   Visit for suture removal 02/21/2021   Fibroids 07/19/2019   Annual physical exam 05/11/2019   Menorrhagia with regular cycle 04/10/2019   Left carpal tunnel syndrome 03/18/2017   Severe persistent asthma 04/15/2016   Abnormal thyroid ultrasound 12/29/2014   Hyperlipidemia LDL goal <100 12/22/2014   Prediabetes 12/22/2014   Low back pain 09/28/2013   Baker's cyst 10/28/2011   PEPTIC ULCER DISEASE, HELICOBACTER PYLORI POSITIVE 11/12/2010   Hypothyroidism 10/30/2010   SCIATICA, LEFT 01/18/2010   Eczema 12/31/2009   Morbid obesity ([place]) 11/02/2008   Essential hypertension 11/02/2008   Allergic rhinitis  11/02/2008   DEGENERATIVE JOINT DISEASE, LEFT KNEE 11/02/2008    Past Surgical History:  Procedure Laterality Date   ANKLE SURGERY     BACK SURGERY     COLONOSCOPY N/A 04/27/2020   Procedure: COLONOSCOPY;  Surgeon: Daneil Dolin, MD;  Location: [facility];  Service: Endoscopy;  Laterality: N/A;  7:30   KNEE SURGERY     TUBAL LIGATION       OB History     Gravida  1   Para  1   Term  1   Preterm      AB      Living  1      SAB      IAB      Ectopic      Multiple      Live Births              Family History  Problem Relation Age of Onset   Asthma Paternal Aunt    Asthma Paternal Uncle    Liver disease Paternal Grandfather    Aneurysm Maternal Grandfather        brain   Heart attack Father     Social History   Tobacco Use   Smoking status: Never   Smokeless tobacco: Never  Vaping Use   Vaping Use: Never used  Substance Use Topics   Alcohol use: Yes    Comment:  1 glass of wine twice a week   Drug use: No    Home Medications Prior to Admission medications   Medication Sig Start Date End Date Taking? Authorizing Provider  amLODipine (NORVASC) 10 MG tablet Take 1 tablet (10 mg total) by mouth daily. 02/06/21   Fayrene Helper, MD  amLODipine (NORVASC) 10 MG tablet Take 1 tablet (10 mg total) by mouth daily. 02/06/21   Fayrene Helper, MD  budesonide-formoterol (SYMBICORT) 160-4.5 MCG/ACT inhaler INHALE 2 PUFFS INTO THEL LUNGS 2 TIMES A DAY 09/12/19   Fayrene Helper, MD  budesonide-formoterol [facility]) 160-4.5 MCG/ACT inhaler INHALE 2 PUFFS INTO THEL LUNGS 2 TIMES A DAY 11/29/20   Fayrene Helper, MD  hydrOXYzine (ATARAX/VISTARIL) 25 MG tablet Take 2-3 tabs at bedtime Patient taking differently: Take 25-75 mg by mouth at bedtime as needed for itching. 04/05/19   Fayrene Helper, MD  levothyroxine (SYNTHROID) 100 MCG tablet Take 1 tablet (100 mcg total) by mouth daily. 02/21/21   Noreene Larsson, NP  megestrol (MEGACE) 40 MG  tablet TAKE 1 TABLET BY MOUTH EVERY DAY Patient taking differently: Take 40 mg by mouth daily. 03/07/20   Estill Dooms, NP  montelukast (SINGULAIR) 10 MG tablet Take 1 tablet (10 mg total) by mouth daily. 02/21/21   Noreene Larsson, NP  triamcinolone cream (KENALOG) 0.1 % APPLY TO AFFECTED AREA ON BODY (NOT FACE) TWICE DAILY FOR 1-2 WEEKS 04/08/21   Fayrene Helper, MD    Allergies    Bactrim [sulfamethoxazole-trimethoprim], Penicillins, and Latex  Review of Systems   Review of Systems  Constitutional:  Negative for chills, diaphoresis, fatigue and fever.  HENT:  Negative for trouble swallowing.   Respiratory:  Negative for cough, shortness of breath and wheezing.   Cardiovascular:  Positive for chest pain. Negative for palpitations.  Gastrointestinal:  Negative for abdominal pain, nausea and vomiting.  Genitourinary:  Negative for dysuria.  Musculoskeletal:  Negative for arthralgias, back pain, myalgias, neck pain and neck stiffness.  Skin:  Negative for rash.  Neurological:  Negative for dizziness, weakness, numbness and headaches.  Hematological:  Does not bruise/bleed easily.   Physical Exam Updated Vital Signs BP (!) 144/92 (BP Location: Right Arm)   Pulse 63   Temp 98.2 F (36.8 C) (Oral)   Resp 18   Ht 5\' 6"  (1.676 m)   Wt 93 kg   SpO2 96%   BMI 33.09 kg/m   Physical Exam Vitals and nursing note reviewed.  Constitutional:      Appearance: Normal appearance. She is not ill-appearing.  HENT:     Head: Normocephalic.  Eyes:     Conjunctiva/sclera: Conjunctivae normal.     Pupils: Pupils are equal, round, and reactive to light.  Neck:     Thyroid: No thyromegaly.     Meningeal: Kernig's sign absent.  Cardiovascular:     Rate and Rhythm: Normal rate and regular rhythm.     Pulses: Normal pulses.  Pulmonary:     Effort: Pulmonary effort is normal. No respiratory distress.     Breath sounds: Normal breath sounds. No wheezing.  Chest:     Chest wall: No  tenderness.  Abdominal:     Palpations: Abdomen is soft.     Tenderness: There is no abdominal tenderness. There is no guarding or rebound.  Musculoskeletal:        General: Normal range of motion.     Cervical back: Normal range of motion. No tenderness.  Right lower leg: No edema.     Left lower leg: No edema.  Skin:    General: Skin is warm.     Findings: No rash.  Neurological:     General: No focal deficit present.     Mental Status: She is alert and oriented to person, place, and time.     Sensory: No sensory deficit.     Motor: No weakness.    ED Results / Procedures / Treatments   Labs (all labs ordered are listed, but only abnormal results are displayed) Labs Reviewed  BASIC METABOLIC PANEL  CBC  CBG MONITORING, ED  POC URINE PREG, ED  TROPONIN I (HIGH SENSITIVITY)  TROPONIN I (HIGH SENSITIVITY)    EKG EKG Interpretation  Date/Time: 04/23/21  Ventricular Rate: 75  PR Interval: 172   QRS Duration: 78 QT Interval: 396  QTC Calculation: 442  R Axis:    Text Interpretation: Normal sinus rhythm.  Normal EKG.  EKG reviewed by Dr. Kathrynn Humble   Radiology DG Chest 2 View  Result Date: 04/23/2021 CLINICAL DATA:  Chest pain EXAM: CHEST - 2 VIEW COMPARISON:  December 09, 2016 FINDINGS: The heart size and mediastinal contours are within normal limits. Left basilar atelectasis. No lobar consolidation. No pleural effusion. No pneumothorax. The visualized skeletal structures are unremarkable. IMPRESSION: No acute cardiopulmonary disease. Electronically Signed   By: Dahlia Bailiff MD   On: 04/23/2021 18:38    Procedures Procedures   Medications Ordered in ED Medications - No data to display  ED Course  I have reviewed the triage vital signs and the nursing notes.  Pertinent labs & imaging results that were available during my care of the patient were reviewed by me and considered in my medical decision making (see chart for details).    MDM  Rules/Calculators/A&P                          Patient here requesting evaluation of left upper chest pain that began while at work this morning.  Pain resolved after approximately 1 hour.  Patient did take 1 aspirin prior to arrival.  No history of cardiac disease.  She does admit that she has a stressful job, but denies known injury.  Chest pain had resolved prior to ER arrival.  No tachycardia, tachypnea or hypoxia.  Low clinical suspicion for PE.  Work-up today has been reassuring.  EKG without acute ischemic changes.  Chest x-ray without acute cardiopulmonary findings.  Labs unremarkable delta troponin unchanged. Cause for patient's left upper chest pain is unclear.  No pain at time of arrival and pain has not returned during ER stay.  Patient feels comfortable with discharge home.  She does have a primary care provider and I will give follow-up information for local cardiology.  Patient agreeable to plan.  Strict return precautions were discussed.   Final Clinical Impression(s) / ED Diagnoses Final diagnoses:  Nonspecific chest pain    Rx / DC Orders ED Discharge Orders     None        Kem Parkinson, PA-C 04/23/21 2323    Varney Biles, MD 04/24/21 1724

## 2021-04-23 NOTE — ED Triage Notes (Signed)
Pt c/o left upper chest pain that stated while at work this morning. Pt denies cardiac hx.

## 2021-04-23 NOTE — ED Provider Notes (Signed)
Emergency Medicine Provider Triage Evaluation Note  [name] , a 55 y.o. female  was evaluated in triage.  Pt complains of left sided chest pain which she woke with this am.  Possibly slept "wrong" but wants to get checked out.  Review of Systems  Positive: Chest pain, Negative: Sob, n/v, diaphoresis, palpitations  Physical Exam  BP (!) 153/98 (BP Location: Right Arm)   Pulse 81   Temp 98.2 F (36.8 C) (Oral)   Resp 18   Ht 5\' 6"  (1.676 m)   Wt 93 kg   SpO2 97%   BMI 33.09 kg/m  Gen:   Awake, no distress   Resp:  Normal effort  MSK:   Moves extremities without difficulty  Other:    Medical Decision Making  Medically screening exam initiated at 5:23 PM.  Appropriate orders placed.  [name] was informed that the remainder of the evaluation will be completed by another provider, this initial triage assessment does not replace that evaluation, and the importance of remaining in the ED until their evaluation is complete.  Left sided chest pain since this am, labs and imaging ordered.    Misty Jefferson, PA-C 04/23/21 [name], Ankit, MD 04/24/21 [phone]

## 2021-04-30 ENCOUNTER — Telehealth: Payer: Self-pay | Admitting: Family Medicine

## 2021-04-30 NOTE — Telephone Encounter (Signed)
LVM for the pt to schedule with any provider as a follow up from ED

## 2021-08-08 ENCOUNTER — Other Ambulatory Visit: Payer: Self-pay

## 2021-08-08 DIAGNOSIS — J309 Allergic rhinitis, unspecified: Secondary | ICD-10-CM

## 2021-08-08 MED ORDER — MONTELUKAST SODIUM 10 MG PO TABS: 10.0000 mg | ORAL_TABLET | Freq: Every day | ORAL | 0 refills | Status: DC

## 2021-11-04 ENCOUNTER — Other Ambulatory Visit: Payer: Self-pay | Admitting: Family Medicine

## 2021-12-04 ENCOUNTER — Other Ambulatory Visit: Payer: Self-pay | Admitting: Family Medicine

## 2021-12-09 ENCOUNTER — Encounter: Payer: Self-pay | Admitting: Family Medicine

## 2022-03-15 ENCOUNTER — Encounter: Payer: Self-pay | Admitting: Family Medicine

## 2022-03-18 ENCOUNTER — Other Ambulatory Visit: Payer: Self-pay | Admitting: Family Medicine

## 2022-03-18 ENCOUNTER — Other Ambulatory Visit: Payer: Self-pay

## 2022-03-18 DIAGNOSIS — E038 Other specified hypothyroidism: Secondary | ICD-10-CM

## 2022-03-18 DIAGNOSIS — E785 Hyperlipidemia, unspecified: Secondary | ICD-10-CM

## 2022-03-18 DIAGNOSIS — R7303 Prediabetes: Secondary | ICD-10-CM

## 2022-03-18 DIAGNOSIS — Z1159 Encounter for screening for other viral diseases: Secondary | ICD-10-CM

## 2022-03-18 DIAGNOSIS — I10 Essential (primary) hypertension: Secondary | ICD-10-CM

## 2022-03-18 MED ORDER — LEVOTHYROXINE SODIUM 100 MCG PO TABS: 100.0000 ug | ORAL_TABLET | Freq: Every day | ORAL | 2 refills | Status: DC

## 2022-03-26 ENCOUNTER — Ambulatory Visit: Payer: BC Managed Care – PPO | Admitting: Family Medicine

## 2022-04-02 ENCOUNTER — Ambulatory Visit: Payer: BC Managed Care – PPO | Admitting: Family Medicine

## 2022-04-02 ENCOUNTER — Encounter: Payer: Self-pay | Admitting: Family Medicine

## 2022-04-02 VITALS — BP 128/85 | HR 74 | Ht 66.0 in | Wt 221.0 lb

## 2022-04-02 DIAGNOSIS — E038 Other specified hypothyroidism: Secondary | ICD-10-CM | POA: Diagnosis not present

## 2022-04-02 DIAGNOSIS — E785 Hyperlipidemia, unspecified: Secondary | ICD-10-CM | POA: Diagnosis not present

## 2022-04-02 DIAGNOSIS — Z1231 Encounter for screening mammogram for malignant neoplasm of breast: Secondary | ICD-10-CM

## 2022-04-02 DIAGNOSIS — Z23 Encounter for immunization: Secondary | ICD-10-CM | POA: Insufficient documentation

## 2022-04-02 DIAGNOSIS — Z1321 Encounter for screening for nutritional disorder: Secondary | ICD-10-CM

## 2022-04-02 DIAGNOSIS — R7303 Prediabetes: Secondary | ICD-10-CM

## 2022-04-02 DIAGNOSIS — J4550 Severe persistent asthma, uncomplicated: Secondary | ICD-10-CM

## 2022-04-02 DIAGNOSIS — I10 Essential (primary) hypertension: Secondary | ICD-10-CM

## 2022-04-02 DIAGNOSIS — Z1159 Encounter for screening for other viral diseases: Secondary | ICD-10-CM

## 2022-04-02 DIAGNOSIS — L2084 Intrinsic (allergic) eczema: Secondary | ICD-10-CM

## 2022-04-02 MED ORDER — BUDESONIDE-FORMOTEROL FUMARATE 80-4.5 MCG/ACT IN AERO: 2.0000 | INHALATION_SPRAY | Freq: Two times a day (BID) | RESPIRATORY_TRACT | 12 refills | Status: DC

## 2022-04-02 MED ORDER — ALBUTEROL SULFATE HFA 108 (90 BASE) MCG/ACT IN AERS: 2.0000 | INHALATION_SPRAY | Freq: Four times a day (QID) | RESPIRATORY_TRACT | 1 refills | Status: DC | PRN

## 2022-04-02 NOTE — Assessment & Plan Note (Signed)
Hyperlipidemia:Low fat diet discussed and encouraged.   Lipid Panel  Lab Results  Component Value Date   CHOL 208 (H) 05/11/2019   HDL 60 05/11/2019   LDLCALC 126 (H) 05/11/2019   TRIG 108 05/11/2019   CHOLHDL 3.5 05/11/2019   Needs to reduce fried and fatty foods Updated lab needed

## 2022-04-02 NOTE — Assessment & Plan Note (Signed)
No flare since changing work environment

## 2022-04-02 NOTE — Assessment & Plan Note (Signed)
Controlled, no change in medication DASH diet and commitment to daily physical activity for a minimum of 30 minutes discussed and encouraged, as a part of hypertension management. The importance of attaining a healthy weight is also discussed.     04/02/2022    4:00 PM 04/23/2021   10:00 PM 04/23/2021    8:56 PM 04/23/2021    4:22 PM 04/23/2021    4:21 PM 02/21/2021    1:10 PM 02/08/2021    3:21 AM  BP/Weight  Systolic BP 165 790 383  338 329 191  Diastolic BP 85 90 92  98 660 106  Wt. (Lbs) 221   205  231 200  BMI 35.67 kg/m2   33.09 kg/m2  39.04 kg/m2 34.33 kg/m2

## 2022-04-02 NOTE — Assessment & Plan Note (Signed)
After obtaining informed consent, the vaccine is  administered , with no adverse effect noted at the time of administration.  

## 2022-04-02 NOTE — Assessment & Plan Note (Signed)
Patient educated about the importance of limiting  Carbohydrate intake , the need to commit to daily physical activity for a minimum of 30 minutes , and to commit weight loss. The fact that changes in all these areas will reduce or eliminate all together the development of diabetes is stressed.      Latest Ref Rng & Units 04/23/2021    5:46 PM 05/11/2019   11:11 AM 12/24/2017    3:30 PM 12/09/2016    1:05 PM 07/18/2016    6:48 PM  Diabetic Labs  HbA1c <5.7 % of total Hgb  5.7  5.6     Chol <200 mg/dL  208  218     HDL > OR = 50 mg/dL  60  76     Calc LDL mg/dL (calc)  126  113     Triglycerides <150 mg/dL  108  163     Creatinine 0.44 - 1.00 mg/dL 0.89  0.81  0.82  0.77  0.73       04/02/2022    4:00 PM 04/23/2021   10:00 PM 04/23/2021    8:56 PM 04/23/2021    4:22 PM 04/23/2021    4:21 PM 02/21/2021    1:10 PM 02/08/2021    3:21 AM  BP/Weight  Systolic BP 841 660 630  160 109 323  Diastolic BP 85 90 92  98 557 106  Wt. (Lbs) 221   205  231 200  BMI 35.67 kg/m2   33.09 kg/m2  39.04 kg/m2 34.33 kg/m2       No data to display          Updated lab needed

## 2022-04-02 NOTE — Assessment & Plan Note (Signed)
  Patient re-educated about  the importance of commitment to a  minimum of 150 minutes of exercise per week as able.  The importance of healthy food choices with portion control discussed, as well as eating regularly and within a 12 hour window most days. The need to choose "clean , green" food 50 to 75% of the time is discussed, as well as to make water the primary drink and set a goal of 64 ounces water daily.       04/02/2022    4:00 PM 04/23/2021    4:22 PM 02/21/2021    1:10 PM  Weight /BMI  Weight 221 lb 205 lb 231 lb  Height '5\' 6"'$  (1.676 m) '5\' 6"'$  (1.676 m) 5' 4.5" (1.638 m)  BMI 35.67 kg/m2 33.09 kg/m2 39.04 kg/m2

## 2022-04-02 NOTE — Progress Notes (Signed)
[name]     MRN: [id number]      DOB: [birth year]   HPI [name] is here for follow up and re-evaluation of chronic medical conditions, medication management and review of any available recent lab and radiology data.  Preventive health is  to be updated, specifically  Cancer screening and Immunization.   The PT denies any adverse reactions to current medications since the last visit. Unable to afford her symbicort inhaler so looking at alternatives C/o weight gain despite eating very little reportedly  ROS Denies recent fever or chills. Denies sinus pressure, nasal congestion, ear pain or sore throat. Denies chest congestion, productive cough or wheezing. Denies chest pains, palpitations and leg swelling Denies abdominal pain, nausea, vomiting,diarrhea or constipation.   Denies dysuria, frequency, hesitancy or incontinence. Denies joint pain, swelling and limitation in mobility. Denies headaches, seizures, numbness, or tingling. Denies depression, anxiety or insomnia. Denies skin break down or rash.   PE  BP 128/85   Pulse 74   Ht '5\' 6"'$  (1.676 m)   Wt 221 lb (100.2 kg)   SpO2 94%   BMI 35.67 kg/m   Patient alert and oriented and in no cardiopulmonary distress.  HEENT: No facial asymmetry, EOMI,     Neck supple .  Chest: Clear to auscultation bilaterally.  CVS: S1, S2 no murmurs, no S3.Regular rate.  ABD: Soft non tender.   Ext: No edema  MS: Adequate ROM spine, shoulders, hips and knees.  Skin: Intact, no ulcerations or rash noted.  Psych: Good eye contact, normal affect. Memory intact not anxious or depressed appearing.  CNS: CN 2-12 intact, power,  normal throughout.no focal deficits noted.   Assessment & Plan Essential hypertension Controlled, no change in medication DASH diet and commitment to daily physical activity for a minimum of 30 minutes discussed and encouraged, as a part of hypertension management. The importance of attaining a healthy  weight is also discussed.     04/02/2022    4:00 PM 04/23/2021   10:00 PM 04/23/2021    8:56 PM 04/23/2021    4:22 PM 04/23/2021    4:21 PM 02/21/2021    1:10 PM 02/08/2021    3:21 AM  BP/Weight  Systolic BP 226 333 545  625 638 937  Diastolic BP 85 90 92  98 342 106  Wt. (Lbs) 221   205  231 200  BMI 35.67 kg/m2   33.09 kg/m2  39.04 kg/m2 34.33 kg/m2       Prediabetes Patient educated about the importance of limiting  Carbohydrate intake , the need to commit to daily physical activity for a minimum of 30 minutes , and to commit weight loss. The fact that changes in all these areas will reduce or eliminate all together the development of diabetes is stressed.      Latest Ref Rng & Units 04/23/2021    5:46 PM 05/11/2019   11:11 AM 12/24/2017    3:30 PM 12/09/2016    1:05 PM 07/18/2016    6:48 PM  Diabetic Labs  HbA1c <5.7 % of total Hgb  5.7  5.6     Chol <200 mg/dL  208  218     HDL > OR = 50 mg/dL  60  76     Calc LDL mg/dL (calc)  126  113     Triglycerides <150 mg/dL  108  163     Creatinine 0.44 - 1.00 mg/dL 0.89  0.81  0.82  0.77  0.73       04/02/2022    4:00 PM 04/23/2021   10:00 PM 04/23/2021    8:56 PM 04/23/2021    4:22 PM 04/23/2021    4:21 PM 02/21/2021    1:10 PM 02/08/2021    3:21 AM  BP/Weight  Systolic BP 924 268 341  962 229 798  Diastolic BP 85 90 92  98 921 106  Wt. (Lbs) 221   205  231 200  BMI 35.67 kg/m2   33.09 kg/m2  39.04 kg/m2 34.33 kg/m2       No data to display          Updated lab needed    Morbid obesity [facility])  Patient re-educated about  the importance of commitment to a  minimum of 150 minutes of exercise per week as able.  The importance of healthy food choices with portion control discussed, as well as eating regularly and within a 12 hour window most days. The need to choose "clean , green" food 50 to 75% of the time is discussed, as well as to make water the primary drink and set a goal of 64 ounces water daily.       04/02/2022     4:00 PM 04/23/2021    4:22 PM 02/21/2021    1:10 PM  Weight /BMI  Weight 221 lb 205 lb 231 lb  Height '5\' 6"'$  (1.676 m) '5\' 6"'$  (1.676 m) 5' 4.5" (1.638 m)  BMI 35.67 kg/m2 33.09 kg/m2 39.04 kg/m2      Hypothyroidism Updated lab needed    Hyperlipidemia LDL goal <100 Hyperlipidemia:Low fat diet discussed and encouraged.   Lipid Panel  Lab Results  Component Value Date   CHOL 208 (H) 05/11/2019   HDL 60 05/11/2019   LDLCALC 126 (H) 05/11/2019   TRIG 108 05/11/2019   CHOLHDL 3.5 05/11/2019   Needs to reduce fried and fatty foods Updated lab needed    Need for shingles vaccine After obtaining informed consent, the vaccine is  administered , with no adverse effect noted at the time of administration.   Severe persistent asthma Albuterol and symbicort prescribed, pt will send message if inhaler cost is still too high, stable and controled   Eczema No flare since changing work environment

## 2022-04-02 NOTE — Assessment & Plan Note (Signed)
Albuterol and symbicort prescribed, pt will send message if inhaler cost is still too high, stable and controled

## 2022-04-02 NOTE — Patient Instructions (Signed)
Annual with pap in 8 weeks, call if you need me sooner, shingrix #2 at visit  Shingrix #1 today  Ask for pricing with good rx card on your inhalers if expensive  Please schedule mammogram at checkout  Fasting CBC, lipid, cmp and EGFR, tSH, hep C screen, HBA1C and vit D this week  Thanks for choosing [facility], we consider it a privelige to serve you.

## 2022-04-02 NOTE — Assessment & Plan Note (Signed)
Updated lab needed.  

## 2022-04-09 ENCOUNTER — Other Ambulatory Visit: Payer: Self-pay | Admitting: Family Medicine

## 2022-04-09 LAB — LIPID PANEL
Chol/HDL Ratio: 2.6 ratio (ref 0.0–4.4)
Cholesterol, Total: 209 mg/dL — ABNORMAL HIGH (ref 100–199)
HDL: 81 mg/dL (ref >39)
LDL Chol Calc (NIH): 109 mg/dL — ABNORMAL HIGH (ref 0–99)
Triglycerides: 108 mg/dL (ref 0–149)
VLDL Cholesterol Cal: 19 mg/dL (ref 5–40)

## 2022-04-09 LAB — CMP14+EGFR
ALT: 21 IU/L (ref 0–32)
AST: 23 IU/L (ref 0–40)
Albumin/Globulin Ratio: 1.6 (ref 1.2–2.2)
Albumin: 4.5 g/dL (ref 3.8–4.9)
Alkaline Phosphatase: 107 IU/L (ref 44–121)
BUN/Creatinine Ratio: 14 (ref 9–23)
BUN: 12 mg/dL (ref 6–24)
Bilirubin Total: 0.3 mg/dL (ref 0.0–1.2)
CO2: 23 mmol/L (ref 20–29)
Calcium: 9.6 mg/dL (ref 8.7–10.2)
Chloride: 99 mmol/L (ref 96–106)
Creatinine, Ser: 0.84 mg/dL (ref 0.57–1.00)
Globulin, Total: 2.9 g/dL (ref 1.5–4.5)
Glucose: 95 mg/dL (ref 70–99)
Potassium: 4.2 mmol/L (ref 3.5–5.2)
Sodium: 139 mmol/L (ref 134–144)
Total Protein: 7.4 g/dL (ref 6.0–8.5)
eGFR: 83 mL/min/{1.73_m2} (ref >59)

## 2022-04-09 LAB — CBC
Hematocrit: 43.1 % (ref 34.0–46.6)
Hemoglobin: 14.4 g/dL (ref 11.1–15.9)
MCH: 28.7 pg (ref 26.6–33.0)
MCHC: 33.4 g/dL (ref 31.5–35.7)
MCV: 86 fL (ref 79–97)
Platelets: 323 10*3/uL (ref 150–450)
RBC: 5.01 x10E6/uL (ref 3.77–5.28)
RDW: 13.4 % (ref 11.7–15.4)
WBC: 7 10*3/uL (ref 3.4–10.8)

## 2022-04-09 LAB — VITAMIN D 25 HYDROXY (VIT D DEFICIENCY, FRACTURES): Vit D, 25-Hydroxy: 13.9 ng/mL — ABNORMAL LOW (ref 30.0–100.0)

## 2022-04-09 LAB — TSH: TSH: 2.27 u[IU]/mL (ref 0.450–4.500)

## 2022-04-09 LAB — HEPATITIS C ANTIBODY: Hep C Virus Ab: NONREACTIVE

## 2022-04-09 LAB — HEMOGLOBIN A1C
Est. average glucose Bld gHb Est-mCnc: 114 mg/dL
Hgb A1c MFr Bld: 5.6 % (ref 4.8–5.6)

## 2022-04-09 MED ORDER — ERGOCALCIFEROL 1.25 MG (50000 UT) PO CAPS: 50000.0000 [IU] | ORAL_CAPSULE | ORAL | 2 refills | Status: DC

## 2022-05-12 ENCOUNTER — Ambulatory Visit (HOSPITAL_COMMUNITY): Payer: BC Managed Care – PPO

## 2022-05-19 ENCOUNTER — Ambulatory Visit (HOSPITAL_COMMUNITY)
Admission: RE | Admit: 2022-05-19 | Discharge: 2022-05-19 | Disposition: A | Discharge status: Home / Self Care | Payer: BC Managed Care – PPO | Source: Ambulatory Visit | Attending: Family Medicine | Admitting: Family Medicine

## 2022-05-19 DIAGNOSIS — Z1231 Encounter for screening mammogram for malignant neoplasm of breast: Secondary | ICD-10-CM | POA: Insufficient documentation

## 2022-05-30 ENCOUNTER — Encounter: Payer: BC Managed Care – PPO | Admitting: Family Medicine

## 2022-07-01 ENCOUNTER — Encounter: Payer: Self-pay | Admitting: Family Medicine

## 2022-07-01 ENCOUNTER — Ambulatory Visit: Payer: BC Managed Care – PPO | Admitting: Internal Medicine

## 2022-07-01 ENCOUNTER — Encounter: Payer: Self-pay | Admitting: Internal Medicine

## 2022-07-01 VITALS — BP 136/76 | HR 81 | Ht 67.0 in | Wt 222.8 lb

## 2022-07-01 DIAGNOSIS — R1030 Lower abdominal pain, unspecified: Secondary | ICD-10-CM | POA: Diagnosis not present

## 2022-07-01 DIAGNOSIS — R109 Unspecified abdominal pain: Secondary | ICD-10-CM | POA: Diagnosis not present

## 2022-07-01 LAB — POCT URINE PREGNANCY: Preg Test, Ur: NEGATIVE

## 2022-07-01 MED ORDER — DICYCLOMINE HCL 10 MG PO CAPS: 10.0000 mg | ORAL_CAPSULE | Freq: Three times a day (TID) | ORAL | 0 refills | Status: DC

## 2022-07-01 NOTE — Patient Instructions (Signed)
It was a pleasure to see you today.  Thank you for giving [place] the opportunity to be involved in your care.  Below is a brief recap of your visit and next steps.  We will plan to see you again in 1 week.  Summary We will check labs today I have prescribed bentyl for cramp relief  Next steps Follow up in 1 week I will notify you of lab results

## 2022-07-01 NOTE — Telephone Encounter (Signed)
Already been scheduled. 

## 2022-07-01 NOTE — Progress Notes (Unsigned)
Acute Office Visit  Subjective:     Patient ID: [name], female    DOB: 10/30/1965, 56 y.o.   MRN: [id number]  Chief Complaint  Patient presents with   Abdominal Pain    Cramping/aching since 06/17/2022   [name] is a 56 year old woman presenting today for an acute visit to discuss lower abdominal pain.  She reports onset of abdominal pain approximately 2 weeks ago.  She describes a constant, cramping pain in the lower portion of her abdomen.  Pain is worse with activity.  She endorses associated nausea.  Denies diarrhea.  She has not had any blood in her stool.  Her last bowel movement was earlier today.  She is also not had much of an appetite.  Pain seems to be relieved with a heating pad.  She reports that her last menstrual period was approximately 2 weeks ago.  The pain she is experiencing feels like pain she typically has on her period.  She remains sexually active, her last sexual encounter was this past weekend.  [name] and her husband do not use any contraception.  [name] denies any significant changes 2 weeks ago.  She did not feel a pop, tear, or acute onset of pain 2 weeks ago.  She additionally denies dysuria, hematuria, or increased frequency.  [name] is reports a history of tubal ligation.  She has not had any additional abdominal surgeries.  She also reports a history of uterine fibroids.  Review of Systems  Gastrointestinal:  Positive for abdominal pain and nausea. Negative for blood in stool, constipation, diarrhea, melena and vomiting.  Genitourinary:  Negative for dysuria, frequency and hematuria.  All other systems reviewed and are negative.     Objective:    BP 136/76   Pulse 81   Ht '5\' 7"'$  (1.702 m)   Wt 222 lb 12.8 oz (101.1 kg)   SpO2 91%   BMI 34.90 kg/m   Physical Exam Constitutional:      Appearance: She is well-developed. She is obese.  Cardiovascular:     Rate and Rhythm: Normal rate and regular rhythm.     Heart sounds: Normal heart  sounds.  Pulmonary:     Effort: Pulmonary effort is normal.     Breath sounds: Normal breath sounds.  Abdominal:     General: Abdomen is flat. Bowel sounds are normal. There is no distension. There are no signs of injury.     Palpations: Abdomen is soft.     Tenderness: There is abdominal tenderness in the right lower quadrant, suprapubic area and left lower quadrant. There is no right CVA tenderness, left CVA tenderness, guarding or rebound. Negative signs include Murphy's sign, Rovsing's sign, McBurney's sign and psoas sign.     Hernia: No hernia is present.  Skin:    General: Skin is warm and dry.     Coloration: Skin is not jaundiced.  Neurological:     Mental Status: She is alert.       Assessment & Plan:   Problem List Items Addressed This Visit     Lower abdominal pain    Presenting today for a 2-week history of lower abdominal pain.  She reports associated nausea and a lack of appetite, but otherwise largely denies symptoms.  She has a past medical history significant for uterine fibroids, but has not had any major abdominal surgeries.  Differential diagnosis remains broad, including GI, GU, and GYN etiologies. -Work-up items ordered today include CBC, CMP,  urine pregnancy test, lipase, and UA. -Given that she describes the pain as a "cramping" sensation.  I have prescribed Bentyl for symptomatic relief. -Further work-up pending additional lab results. -Follow-up planned for 1 week      Meds ordered this encounter  Medications   dicyclomine (BENTYL) 10 MG capsule    Sig: Take 1 capsule (10 mg total) by mouth 4 (four) times daily -  before meals and at bedtime for 30 doses.    Dispense:  30 capsule    Refill:  0    Return in about 1 week (around 07/08/2022).  Johnette Abraham, MD

## 2022-07-02 ENCOUNTER — Encounter: Payer: Self-pay | Admitting: Internal Medicine

## 2022-07-02 DIAGNOSIS — R1030 Lower abdominal pain, unspecified: Secondary | ICD-10-CM | POA: Insufficient documentation

## 2022-07-02 LAB — CMP14+EGFR
ALT: 19 IU/L (ref 0–32)
AST: 23 IU/L (ref 0–40)
Albumin/Globulin Ratio: 1.6 (ref 1.2–2.2)
Albumin: 4.6 g/dL (ref 3.8–4.9)
Alkaline Phosphatase: 107 IU/L (ref 44–121)
BUN/Creatinine Ratio: 14 (ref 9–23)
BUN: 11 mg/dL (ref 6–24)
Bilirubin Total: 0.4 mg/dL (ref 0.0–1.2)
CO2: 26 mmol/L (ref 20–29)
Calcium: 9.8 mg/dL (ref 8.7–10.2)
Chloride: 97 mmol/L (ref 96–106)
Creatinine, Ser: 0.8 mg/dL (ref 0.57–1.00)
Globulin, Total: 2.9 g/dL (ref 1.5–4.5)
Glucose: 89 mg/dL (ref 70–99)
Potassium: 4 mmol/L (ref 3.5–5.2)
Sodium: 138 mmol/L (ref 134–144)
Total Protein: 7.5 g/dL (ref 6.0–8.5)
eGFR: 87 mL/min/{1.73_m2} (ref >59)

## 2022-07-02 LAB — URINALYSIS
Bilirubin, UA: NEGATIVE
Glucose, UA: NEGATIVE
Ketones, UA: NEGATIVE
Leukocytes,UA: NEGATIVE
Nitrite, UA: NEGATIVE
Protein,UA: NEGATIVE
RBC, UA: NEGATIVE
Specific Gravity, UA: 1.013 (ref 1.005–1.030)
Urobilinogen, Ur: 0.2 mg/dL (ref 0.2–1.0)
pH, UA: 6.5 (ref 5.0–7.5)

## 2022-07-02 LAB — CBC WITH DIFFERENTIAL/PLATELET
Basophils Absolute: 0.1 10*3/uL (ref 0.0–0.2)
Basos: 1 %
EOS (ABSOLUTE): 0.5 10*3/uL — ABNORMAL HIGH (ref 0.0–0.4)
Eos: 5 %
Hematocrit: 43.6 % (ref 34.0–46.6)
Hemoglobin: 14.3 g/dL (ref 11.1–15.9)
Immature Grans (Abs): 0 10*3/uL (ref 0.0–0.1)
Immature Granulocytes: 0 %
Lymphocytes Absolute: 1.5 10*3/uL (ref 0.7–3.1)
Lymphs: 17 %
MCH: 28.6 pg (ref 26.6–33.0)
MCHC: 32.8 g/dL (ref 31.5–35.7)
MCV: 87 fL (ref 79–97)
Monocytes Absolute: 0.7 10*3/uL (ref 0.1–0.9)
Monocytes: 8 %
Neutrophils Absolute: 6.3 10*3/uL (ref 1.4–7.0)
Neutrophils: 69 %
Platelets: 324 10*3/uL (ref 150–450)
RBC: 5 x10E6/uL (ref 3.77–5.28)
RDW: 13.3 % (ref 11.7–15.4)
WBC: 9.1 10*3/uL (ref 3.4–10.8)

## 2022-07-02 LAB — LIPASE: Lipase: 16 U/L (ref 14–72)

## 2022-07-02 NOTE — Assessment & Plan Note (Addendum)
Presenting today for a 2-week history of lower abdominal pain.  She reports associated nausea and a lack of appetite, but otherwise largely denies symptoms.  She has a past medical history significant for uterine fibroids, but has not had any major abdominal surgeries.  Differential diagnosis remains broad, including GI, GU, and GYN etiologies. -Work-up items ordered today include CBC, CMP, urine pregnancy test, lipase, and UA. -Given that she describes the pain as a "cramping" sensation.  I have prescribed Bentyl for symptomatic relief. -Further work-up pending additional lab results. -Follow-up planned for 1 week

## 2022-07-09 ENCOUNTER — Ambulatory Visit: Payer: BC Managed Care – PPO | Admitting: Family Medicine

## 2022-07-11 ENCOUNTER — Encounter: Payer: Self-pay | Admitting: Family Medicine

## 2022-07-11 ENCOUNTER — Ambulatory Visit: Payer: BC Managed Care – PPO | Admitting: Family Medicine

## 2022-07-14 ENCOUNTER — Telehealth: Payer: Self-pay

## 2022-07-14 NOTE — Telephone Encounter (Signed)
FMLA  Copied Noted Sleeved Front folder for appt 07/18/2022

## 2022-07-15 ENCOUNTER — Encounter: Payer: Self-pay | Admitting: Family Medicine

## 2022-07-16 ENCOUNTER — Other Ambulatory Visit: Payer: Self-pay | Admitting: Family Medicine

## 2022-07-17 ENCOUNTER — Other Ambulatory Visit: Payer: Self-pay | Admitting: Internal Medicine

## 2022-07-17 DIAGNOSIS — R109 Unspecified abdominal pain: Secondary | ICD-10-CM

## 2022-07-17 MED ORDER — DICYCLOMINE HCL 10 MG PO CAPS: 10.0000 mg | ORAL_CAPSULE | Freq: Three times a day (TID) | ORAL | 0 refills | Status: DC

## 2022-07-18 ENCOUNTER — Encounter: Payer: Self-pay | Admitting: Family Medicine

## 2022-07-18 ENCOUNTER — Ambulatory Visit: Payer: BC Managed Care – PPO | Admitting: Family Medicine

## 2022-07-18 VITALS — BP 129/83 | HR 85 | Ht 67.0 in | Wt 222.0 lb

## 2022-07-18 DIAGNOSIS — Z23 Encounter for immunization: Secondary | ICD-10-CM | POA: Diagnosis not present

## 2022-07-18 DIAGNOSIS — E038 Other specified hypothyroidism: Secondary | ICD-10-CM | POA: Diagnosis not present

## 2022-07-18 DIAGNOSIS — Z0289 Encounter for other administrative examinations: Secondary | ICD-10-CM

## 2022-07-18 DIAGNOSIS — I10 Essential (primary) hypertension: Secondary | ICD-10-CM | POA: Diagnosis not present

## 2022-07-18 DIAGNOSIS — J4550 Severe persistent asthma, uncomplicated: Secondary | ICD-10-CM | POA: Diagnosis not present

## 2022-07-18 DIAGNOSIS — E559 Vitamin D deficiency, unspecified: Secondary | ICD-10-CM

## 2022-07-18 DIAGNOSIS — Z0279 Encounter for issue of other medical certificate: Secondary | ICD-10-CM

## 2022-07-18 NOTE — Patient Instructions (Signed)
Annual with pap in 4 to 7 weeks, call if you need me sooner. Shingrix #2 at visit   Flu vaccine today  We are working on maintenance and rescue inhalers for you that are covered and will be in touch and will clarify  fMLA form  completed and returned today

## 2022-07-18 NOTE — Progress Notes (Signed)
   [name]     MRN: [id number]      DOB: July 10, 1966   HPI [name] is here for follow up and re-evaluation of chronic medical conditions, medication management and review of any available recent lab and radiology data.  Preventive health is updated, specifically  Cancer screening and Immunization.   Questions or concerns regarding consultations or procedures which the PT has had in the interim are  addressed. The PT denies any adverse reactions to current medications since the last visit.  There are no new concerns.  There are no specific complaints   ROS Denies recent fever or chills. Denies sinus pressure, nasal congestion, ear pain or sore throat. Denies chest congestion, productive cough or wheezing. Denies chest pains, palpitations and leg swelling Denies abdominal pain, nausea, vomiting,diarrhea or constipation.   Denies dysuria, frequency, hesitancy or incontinence. Denies joint pain, swelling and limitation in mobility. Denies headaches, seizures, numbness, or tingling. Denies depression, anxiety or insomnia. Denies skin break down or rash.   PE  BP 129/83 (BP Location: Right Arm, Patient Position: Sitting)   Pulse 85   Ht '5\' 7"'$  (1.702 m)   Wt 222 lb (100.7 kg)   SpO2 94%   BMI 34.77 kg/m   Patient alert and oriented and in no cardiopulmonary distress.  HEENT: No facial asymmetry, EOMI,     Neck supple .  Chest: Clear to auscultation bilaterally.  CVS: S1, S2 no murmurs, no S3.Regular rate.  ABD: Soft non tender.   Ext: No edema  MS: Adequate ROM spine, shoulders, hips and knees.  Skin: Intact, no ulcerations or rash noted.  Psych: Good eye contact, normal affect. Memory intact not anxious or depressed appearing.  CNS: CN 2-12 intact, power,  normal throughout.no focal deficits noted.   Assessment & Plan  Essential hypertension Controlled, no change in medication DASH diet and commitment to daily physical activity for a minimum of 30 minutes  discussed and encouraged, as a part of hypertension management. The importance of attaining a healthy weight is also discussed. FMLA for for routine follow up     07/18/2022    8:50 AM 07/01/2022    3:31 PM 04/02/2022    4:00 PM 04/23/2021   10:00 PM 04/23/2021    8:56 PM 04/23/2021    4:22 PM 04/23/2021    4:21 PM  BP/Weight  Systolic BP 415 830 940 768 088  110  Diastolic BP 83 76 85 90 92  98  Wt. (Lbs) 222 222.8 221   205   BMI 34.77 kg/m2 34.9 kg/m2 35.67 kg/m2   33.09 kg/m2        Hypothyroidism Controlled, no change in medication   Severe persistent asthma Need to ensure she has access to affordable meds, same completed on day of visit FMLA form completed for chronic disease management  Vitamin D deficiency Continue supplement weekly  Encounter for completion of form with patient FMLA form completed at visit, discussed the info provided with the pt and she agrees

## 2022-07-18 NOTE — Telephone Encounter (Signed)
Spoke with [facility] medication to early to be filled but patient copay will be $15 dollars when needed.

## 2022-07-21 ENCOUNTER — Encounter: Payer: Self-pay | Admitting: Family Medicine

## 2022-07-21 DIAGNOSIS — Z0289 Encounter for other administrative examinations: Secondary | ICD-10-CM | POA: Insufficient documentation

## 2022-07-21 DIAGNOSIS — E559 Vitamin D deficiency, unspecified: Secondary | ICD-10-CM | POA: Insufficient documentation

## 2022-07-21 NOTE — Assessment & Plan Note (Signed)
Continue supplement weekly

## 2022-07-21 NOTE — Assessment & Plan Note (Signed)
FMLA form completed at visit, discussed the info provided with the pt and she agrees

## 2022-07-21 NOTE — Assessment & Plan Note (Signed)
Controlled, no change in medication  

## 2022-07-21 NOTE — Assessment & Plan Note (Addendum)
Controlled, no change in medication DASH diet and commitment to daily physical activity for a minimum of 30 minutes discussed and encouraged, as a part of hypertension management. The importance of attaining a healthy weight is also discussed. FMLA for for routine follow up     07/18/2022    8:50 AM 07/01/2022    3:31 PM 04/02/2022    4:00 PM 04/23/2021   10:00 PM 04/23/2021    8:56 PM 04/23/2021    4:22 PM 04/23/2021    4:21 PM  BP/Weight  Systolic BP 024 097 353 299 242  683  Diastolic BP 83 76 85 90 92  98  Wt. (Lbs) 222 222.8 221   205   BMI 34.77 kg/m2 34.9 kg/m2 35.67 kg/m2   33.09 kg/m2

## 2022-07-21 NOTE — Assessment & Plan Note (Signed)
Need to ensure she has access to affordable meds, same completed on day of visit FMLA form completed for chronic disease management

## 2022-07-23 IMAGING — DX DG CHEST 2V
2 series · 2 of 2 positions shown · non-contrast
Comparison: December 09, 2016

CLINICAL DATA: Chest pain

EXAM:
CHEST - 2 VIEW

[chest pa]
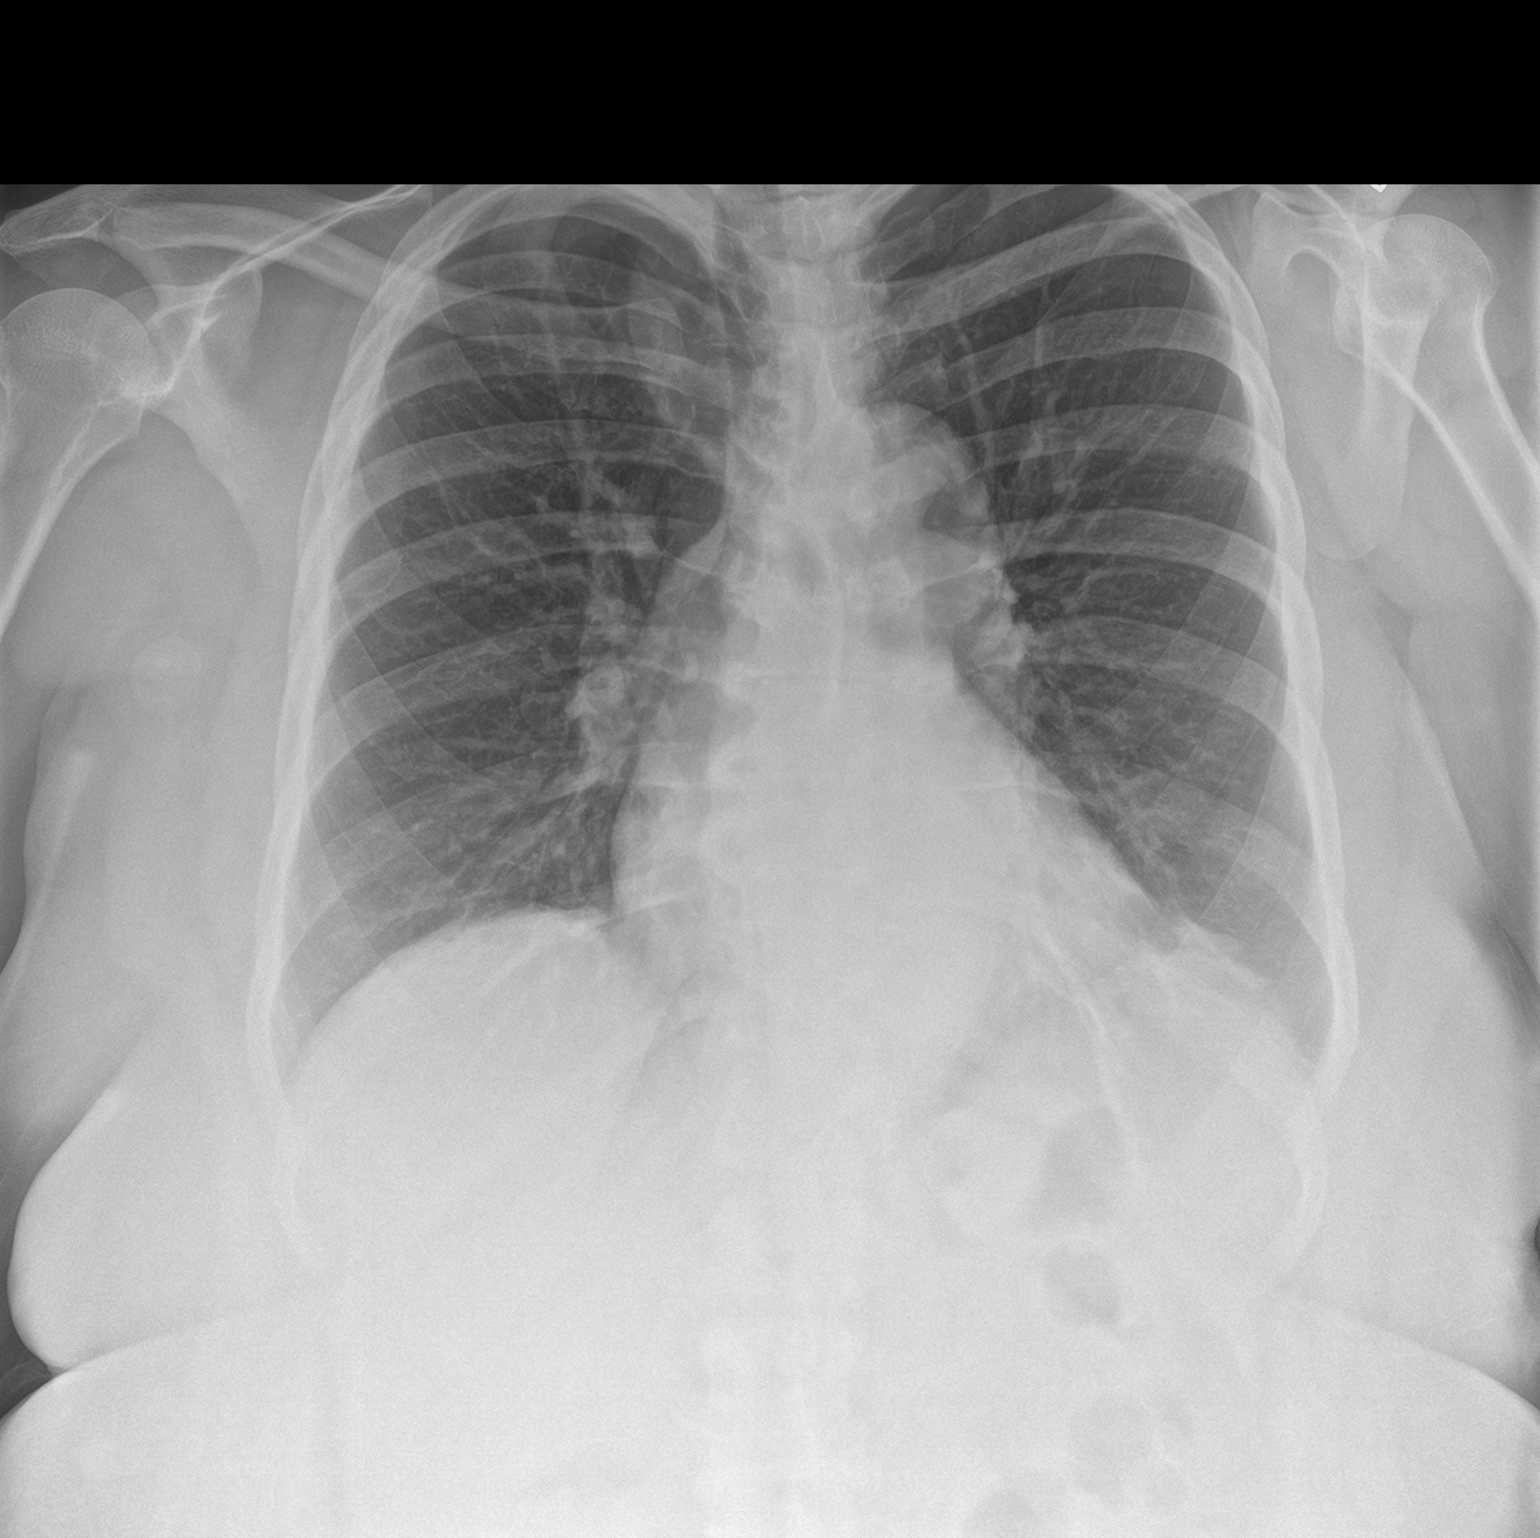

[chest lat]
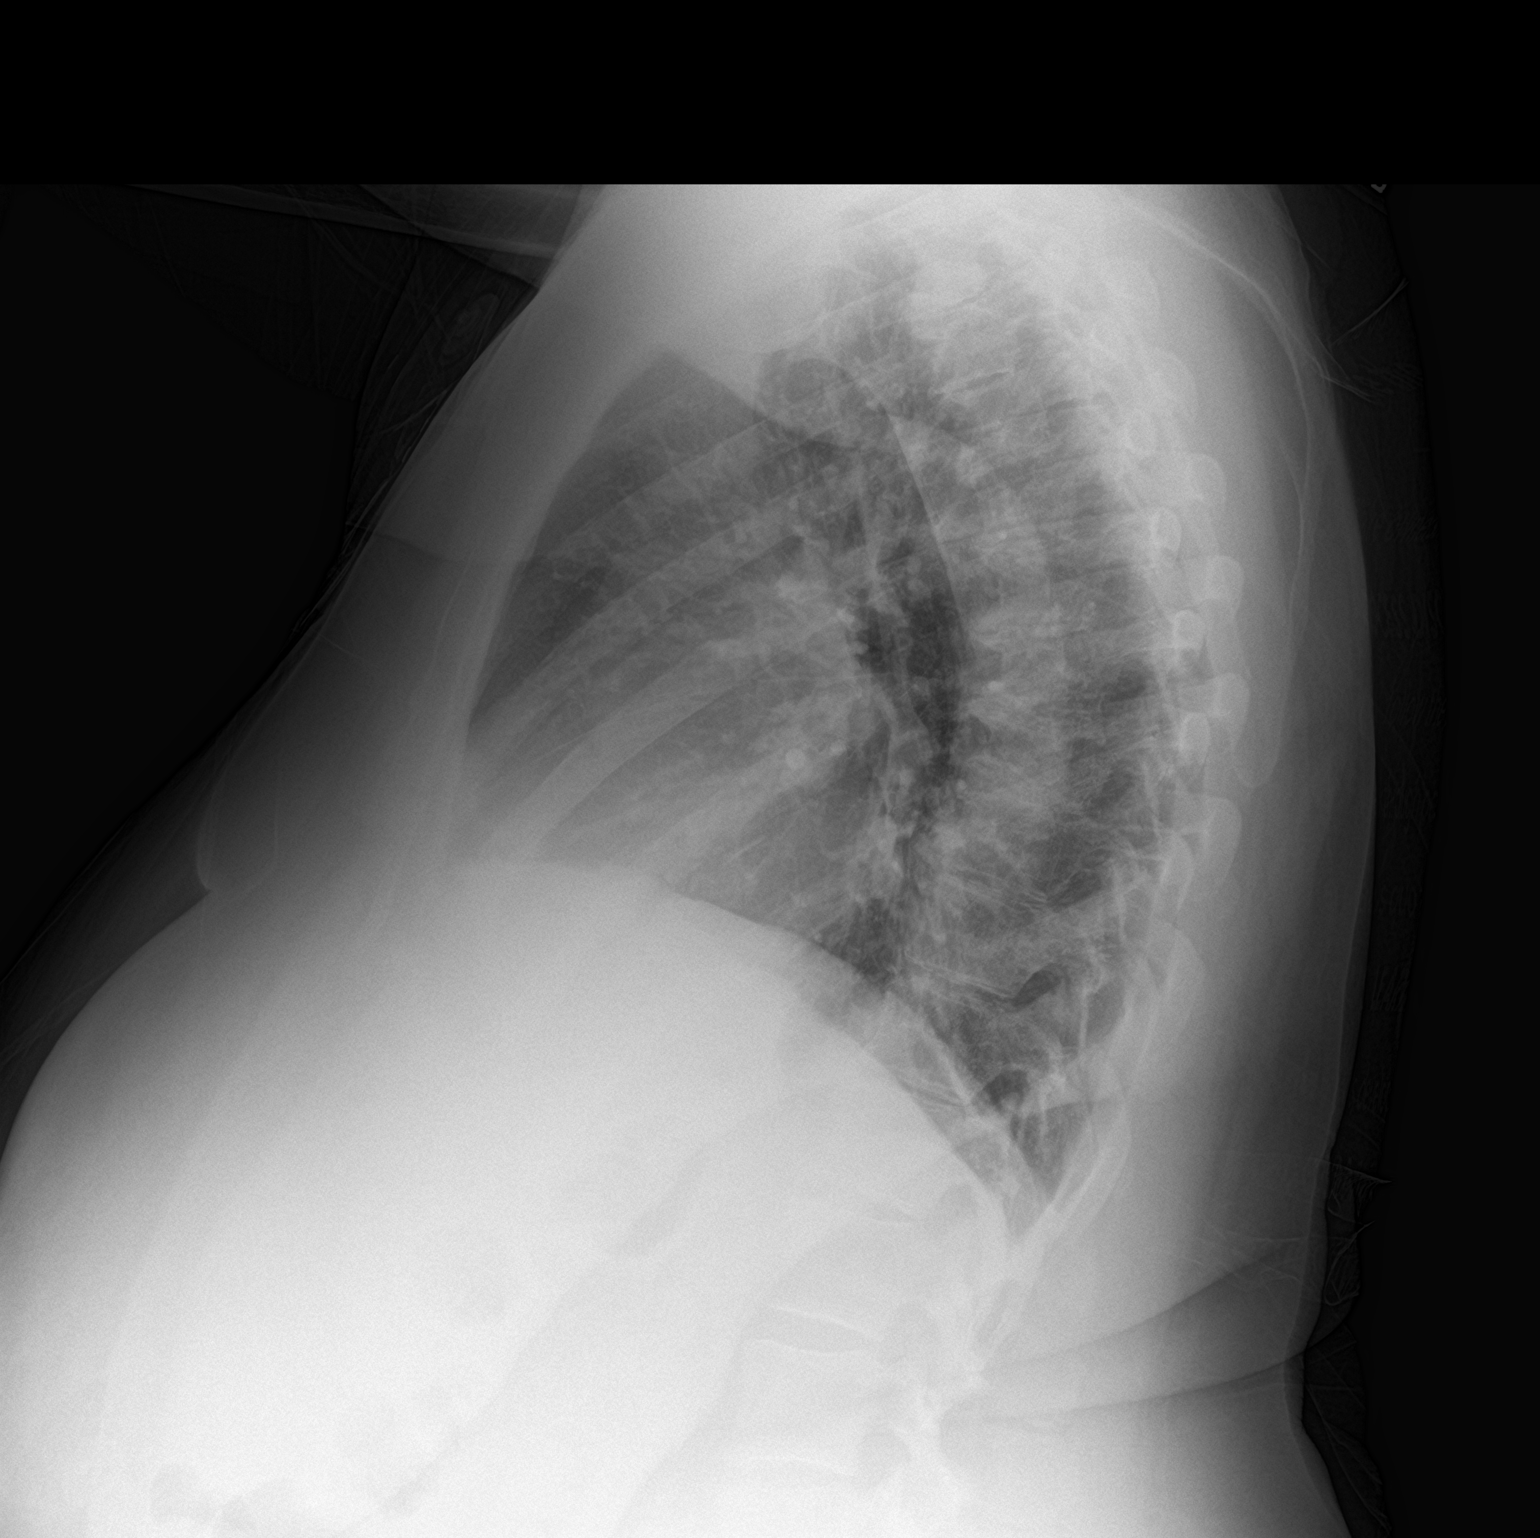

[2 of 2 positions shown; findings below may reference images not displayed]

FINDINGS: The heart size and mediastinal contours are within normal limits.
Left basilar atelectasis. No lobar consolidation. No pleural
effusion. No pneumothorax. The visualized skeletal structures are
unremarkable.
IMPRESSION: No acute cardiopulmonary disease.

## 2022-07-25 ENCOUNTER — Ambulatory Visit: Payer: BC Managed Care – PPO | Admitting: Family Medicine

## 2022-07-31 ENCOUNTER — Ambulatory Visit: Payer: BC Managed Care – PPO | Admitting: Family Medicine

## 2022-09-09 ENCOUNTER — Ambulatory Visit: Payer: BC Managed Care – PPO | Admitting: Family Medicine

## 2022-09-14 ENCOUNTER — Encounter: Payer: Self-pay | Admitting: Family Medicine

## 2022-09-15 ENCOUNTER — Encounter: Payer: Self-pay | Admitting: Family Medicine

## 2022-09-17 ENCOUNTER — Ambulatory Visit (INDEPENDENT_AMBULATORY_CARE_PROVIDER_SITE_OTHER): Payer: BC Managed Care – PPO | Admitting: Family Medicine

## 2022-09-17 ENCOUNTER — Encounter: Payer: Self-pay | Admitting: Family Medicine

## 2022-09-17 ENCOUNTER — Other Ambulatory Visit (HOSPITAL_COMMUNITY)
Admission: RE | Admit: 2022-09-17 | Discharge: 2022-09-17 | Disposition: A | Discharge status: Home / Self Care | Payer: BC Managed Care – PPO | Source: Ambulatory Visit | Attending: Family Medicine | Admitting: Family Medicine

## 2022-09-17 VITALS — BP 128/82 | HR 82 | Ht 67.0 in | Wt 220.1 lb

## 2022-09-17 DIAGNOSIS — Z23 Encounter for immunization: Secondary | ICD-10-CM | POA: Diagnosis not present

## 2022-09-17 DIAGNOSIS — E785 Hyperlipidemia, unspecified: Secondary | ICD-10-CM

## 2022-09-17 DIAGNOSIS — Z0001 Encounter for general adult medical examination with abnormal findings: Secondary | ICD-10-CM

## 2022-09-17 DIAGNOSIS — Z0000 Encounter for general adult medical examination without abnormal findings: Secondary | ICD-10-CM

## 2022-09-17 DIAGNOSIS — Z124 Encounter for screening for malignant neoplasm of cervix: Secondary | ICD-10-CM

## 2022-09-17 DIAGNOSIS — L987 Excessive and redundant skin and subcutaneous tissue: Secondary | ICD-10-CM

## 2022-09-17 DIAGNOSIS — T733XXA Exhaustion due to excessive exertion, initial encounter: Secondary | ICD-10-CM

## 2022-09-17 DIAGNOSIS — I10 Essential (primary) hypertension: Secondary | ICD-10-CM

## 2022-09-17 DIAGNOSIS — J4550 Severe persistent asthma, uncomplicated: Secondary | ICD-10-CM

## 2022-09-17 DIAGNOSIS — R002 Palpitations: Secondary | ICD-10-CM

## 2022-09-17 DIAGNOSIS — Z8249 Family history of ischemic heart disease and other diseases of the circulatory system: Secondary | ICD-10-CM

## 2022-09-17 MED ORDER — ROSUVASTATIN CALCIUM 5 MG PO TABS: 5.0000 mg | ORAL_TABLET | Freq: Every day | ORAL | 1 refills | Status: DC

## 2022-09-17 MED ORDER — TRELEGY ELLIPTA 200-62.5-25 MCG/ACT IN AEPB: 1.0000 | INHALATION_SPRAY | Freq: Every day | RESPIRATORY_TRACT | 3 refills | Status: AC

## 2022-09-17 MED ORDER — ALBUTEROL SULFATE HFA 108 (90 BASE) MCG/ACT IN AERS: 2.0000 | INHALATION_SPRAY | Freq: Four times a day (QID) | RESPIRATORY_TRACT | 3 refills | Status: DC | PRN

## 2022-09-17 NOTE — Patient Instructions (Signed)
F/u early March, call if you need me sooner  Shingrix No. 2 today.  Please get COVID vaccine as we discussed.  New medication in place of Symbicort 2 Trelegy 1 inhalation once daily, we will provide samples as well as a discount coupon card.  [facility]buterol is again prescribed and [facility].    New medicine to help with lowering cholesterol is Crestor 5 mg once daily.  You are referred to cardiology for evaluation of fatigue and palpitations it  is important that you go  especially since your father died of heart disease at a young age.  You are being referred to dermatology regarding excessive skin  Fasting lipid CMP and EGFR and TSH to be obtained 3 to 7 days before your next visit.  Work on weight loss, with more regular exercise and change in food Thanks for choosing [facility], we consider it a privelige to serve you.

## 2022-09-21 ENCOUNTER — Encounter: Payer: Self-pay | Admitting: Family Medicine

## 2022-09-21 DIAGNOSIS — Z0000 Encounter for general adult medical examination without abnormal findings: Secondary | ICD-10-CM | POA: Insufficient documentation

## 2022-09-21 DIAGNOSIS — L987 Excessive and redundant skin and subcutaneous tissue: Secondary | ICD-10-CM | POA: Insufficient documentation

## 2022-09-21 DIAGNOSIS — T733XXA Exhaustion due to excessive exertion, initial encounter: Secondary | ICD-10-CM | POA: Insufficient documentation

## 2022-09-21 NOTE — Assessment & Plan Note (Signed)

## 2022-09-21 NOTE — Assessment & Plan Note (Signed)
Excessive skin noted in both axilla, reports increasing in size, refer Dermatology for eval

## 2022-09-21 NOTE — Assessment & Plan Note (Signed)
Change in maintainace med to Trelegy, sample and coupon provided, albuterol again prescribed for breakthrough

## 2022-09-21 NOTE — Assessment & Plan Note (Signed)
Reports new onset exertional fatigue, intermittent palpitations and has a positive f/h of premature CAD, refer Cardiology

## 2022-09-21 NOTE — Progress Notes (Signed)
    [name]     MRN: [id number]      DOB: 08/12/1966  HPI: Patient is in for annual physical exam. C/o excess wrinkling of skin under armpits increasing in size C/o poor exercise tolerance and palpitations, has f/h of premature CAD , and personal h/o HTN and hyperlipidemia Recent labs,  are reviewed. Immunization is reviewed , and  updated if needed.   PE: BP 128/82   Pulse 82   Ht '5\' 7"'$  (1.702 m)   Wt 220 lb 1 oz (99.8 kg)   SpO2 92%   BMI 34.47 kg/m   Pleasant  female, alert and oriented x 3, in no cardio-pulmonary distress. Afebrile. HEENT No facial trauma or asymetry. Sinuses non tender.  Extra occullar muscles intact.. External ears normal, . Neck: supple, no adenopathy,JVD or thyromegaly.No bruits.  Chest: Clear to ascultation bilaterally.No crackles or wheezes.Decreased air entry Non tender to palpation  Breast: No asymetry,no masses or lumps. No tenderness. No nipple discharge or inversion. No axillary or supraclavicular adenopathy  Cardiovascular system; Heart sounds normal,  S1 and  S2 ,no S3.  No murmur, or thrill. Apical beat not displaced Peripheral pulses normal.  Abdomen: Soft, non tender, no organomegaly or masses. No bruits. Bowel sounds normal. No guarding, tenderness or rebound.   GU: External genitalia normal female genitalia , normal female distribution of hair. No lesions. Urethral meatus normal in size, no  Prolapse, no lesions visibly  Present. Bladder non tender. Vagina pink and moist , with no visible lesions , discharge present . Adequate pelvic support no  cystocele or rectocele noted Cervix pink and appears healthy, no lesions or ulcerations noted, no discharge noted from os Uterus normal size, no adnexal masses, no cervical motion or adnexal tenderness.   Musculoskeletal exam: Full ROM of spine, hips , shoulders and knees. No deformity ,swelling or crepitus noted. No muscle wasting or atrophy.   Neurologic: Cranial  nerves 2 to 12 intact. Power, tone ,sensation and reflexes normal throughout. No disturbance in gait. No tremor.  Skin: Intact, no ulceration, erythema , scaling or rash noted.excess skin / adipose tissue in both axilla Pigmentation normal throughout  Psych; Normal mood and affect. Judgement and concentration normal   Assessment & Plan:  Encounter for annual physical exam Annual exam as documented. Counseling done  re healthy lifestyle involving commitment to 150 minutes exercise per week, heart healthy diet, and attaining healthy weight.The importance of adequate sleep also discussed. Regular seat belt use and home safety, is also discussed. Changes in health habits are decided on by the patient with goals and time frames  set for achieving them. Immunization and cancer screening needs are specifically addressed at this visit.   Excess skin Excessive skin noted in both axilla, reports increasing in size, refer Dermatology for eval  Severe persistent asthma Change in maintainace med to Trelegy, sample and coupon provided, albuterol again prescribed for breakthrough  Fatigue due to excessive exertion Reports new onset exertional fatigue, intermittent palpitations and has a positive f/h of premature CAD, refer Cardiology

## 2022-09-23 LAB — CYTOLOGY - PAP
Adequacy: ABSENT
Comment: NEGATIVE
Diagnosis: NEGATIVE
High risk HPV: NEGATIVE

## 2022-10-03 ENCOUNTER — Other Ambulatory Visit: Payer: Self-pay | Admitting: Family Medicine

## 2022-10-03 DIAGNOSIS — E038 Other specified hypothyroidism: Secondary | ICD-10-CM

## 2022-10-09 ENCOUNTER — Other Ambulatory Visit: Payer: Self-pay | Admitting: Family Medicine

## 2022-11-27 ENCOUNTER — Other Ambulatory Visit: Payer: Self-pay | Admitting: Internal Medicine

## 2022-11-27 DIAGNOSIS — R109 Unspecified abdominal pain: Secondary | ICD-10-CM

## 2022-12-02 ENCOUNTER — Encounter: Payer: Self-pay | Admitting: Family Medicine

## 2022-12-02 ENCOUNTER — Other Ambulatory Visit: Payer: Self-pay

## 2022-12-02 ENCOUNTER — Ambulatory Visit: Payer: BC Managed Care – PPO | Admitting: Family Medicine

## 2022-12-02 ENCOUNTER — Ambulatory Visit (HOSPITAL_COMMUNITY)
Admission: RE | Admit: 2022-12-02 | Discharge: 2022-12-02 | Disposition: A | Discharge status: Home / Self Care | Payer: BC Managed Care – PPO | Source: Ambulatory Visit | Attending: Family Medicine | Admitting: Family Medicine

## 2022-12-02 ENCOUNTER — Other Ambulatory Visit (HOSPITAL_COMMUNITY)
Admission: RE | Admit: 2022-12-02 | Discharge: 2022-12-02 | Disposition: A | Discharge status: Home / Self Care | Payer: BC Managed Care – PPO | Source: Ambulatory Visit | Attending: Family Medicine | Admitting: Family Medicine

## 2022-12-02 VITALS — BP 133/88 | HR 88 | Ht 67.0 in | Wt 217.1 lb

## 2022-12-02 DIAGNOSIS — E038 Other specified hypothyroidism: Secondary | ICD-10-CM

## 2022-12-02 DIAGNOSIS — R1011 Right upper quadrant pain: Secondary | ICD-10-CM | POA: Insufficient documentation

## 2022-12-02 DIAGNOSIS — I10 Essential (primary) hypertension: Secondary | ICD-10-CM | POA: Diagnosis not present

## 2022-12-02 DIAGNOSIS — K810 Acute cholecystitis: Secondary | ICD-10-CM | POA: Diagnosis not present

## 2022-12-02 DIAGNOSIS — J4550 Severe persistent asthma, uncomplicated: Secondary | ICD-10-CM

## 2022-12-02 LAB — CBC WITH DIFFERENTIAL/PLATELET
Abs Immature Granulocytes: 0.04 10*3/uL (ref 0.00–0.07)
Basophils Absolute: 0 10*3/uL (ref 0.0–0.1)
Basophils Relative: 0 %
Eosinophils Absolute: 0.2 10*3/uL (ref 0.0–0.5)
Eosinophils Relative: 2 %
HCT: 40.6 % (ref 36.0–46.0)
Hemoglobin: 13.2 g/dL (ref 12.0–15.0)
Immature Granulocytes: 0 %
Lymphocytes Relative: 12 %
Lymphs Abs: 1.2 10*3/uL (ref 0.7–4.0)
MCH: 28.6 pg (ref 26.0–34.0)
MCHC: 32.5 g/dL (ref 30.0–36.0)
MCV: 88.1 fL (ref 80.0–100.0)
Monocytes Absolute: 0.8 10*3/uL (ref 0.1–1.0)
Monocytes Relative: 8 %
Neutro Abs: 7.9 10*3/uL — ABNORMAL HIGH (ref 1.7–7.7)
Neutrophils Relative %: 78 %
Platelets: 309 10*3/uL (ref 150–400)
RBC: 4.61 MIL/uL (ref 3.87–5.11)
RDW: 13.9 % (ref 11.5–15.5)
WBC: 10.2 10*3/uL (ref 4.0–10.5)
nRBC: 0 % (ref 0.0–0.2)

## 2022-12-02 LAB — LIPASE, BLOOD: Lipase: 21 U/L (ref 11–51)

## 2022-12-02 LAB — AMYLASE: Amylase: 31 U/L (ref 28–100)

## 2022-12-02 NOTE — Assessment & Plan Note (Addendum)
Urgent referral to Surgery , appt set for 1 week as new pt, pt should be aware that if symptoms worsen, as in pain  , fever , choiills , N/V needs to go tot the ED

## 2022-12-02 NOTE — Patient Instructions (Signed)
F/U as before, call if you need me sooner  Need [place] today , I think your pain is from gallstones, if they are I will be discussing you with a  surgeon today After the [place] you need  speak directly with me about the " next step"before you go home  Stat labs at [facility], CBC and diff, cmp , amylase and lipase  Work excuse for today  If your test does not show gallstones you will need to go to the ED for further evaluation  Thanks for choosing [facility], we consider it a privelige to serve you.

## 2022-12-02 NOTE — Assessment & Plan Note (Signed)
Stable and controlled, marked improvement with change in work environment

## 2022-12-02 NOTE — Progress Notes (Signed)
   [name]     MRN: [id number]      DOB: 25-Feb-1966   HPI [name] is here 6 day h/o RUQ pain up to a 10, kept her up all of the night of 02/18, poor appetite, vomitted once, had similar presentation in 06/2022, not as severe at that time , intermittent , no fever or chills  ROS Denies recent fever or chills. Denies sinus pressure, nasal congestion, ear pain or sore throat. Denies chest congestion, productive cough or wheezing. Denies chest pains, palpitations and leg swelling   Denies dysuria, frequency, hesitancy or incontinence. Denies joint pain, swelling and limitation in mobility. Denies headaches, seizures, numbness, or tingling. Denies depression, anxiety or insomnia. Denies skin break down or rash.   PE  BP 133/88   Pulse 88   Ht 5' 7"$  (1.702 m)   Wt 217 lb 1.3 oz (98.5 kg)   SpO2 90%   BMI 34.00 kg/m   Patient alert and oriented and in no cardiopulmonary distress.  HEENT: No facial asymmetry, EOMI,     Neck supple .  Chest: Clear to auscultation bilaterally.  CVS: S1, S2 no murmurs, no S3.Regular rate.  ABD: Soft RUQ tenderness  Ext: No edema  MS: Adequate ROM spine, shoulders, hips and knees.  Skin: Intact, no ulcerations or rash noted.  Psych: Good eye contact, normal affect. Memory intact not anxious or depressed appearing.  CNS: CN 2-12 intact, power,  normal throughout.no focal deficits noted.   Assessment & Plan Colicky RUQ abdominal pain S/s consistent with acute cholecysttis, confirmed gallstones on imaging today Normal amylae amd lipase, shift with high normal WBC Urgent surgery appt, work excuse x 1 day   Essential hypertension Controlled, no change in medication   Hypothyroidism Updated lab needed at/ before next visit.   Severe persistent asthma Stable and controlled, marked improvement with change in work environment  Acute cholecystitis Urgent referral to Surgery , appt set for 1 week as new pt, pt should be aware that  if symptoms worsen, as in pain  , fever , choiills , N/V needs to go tot the ED

## 2022-12-02 NOTE — Assessment & Plan Note (Signed)
Controlled, no change in medication  

## 2022-12-02 NOTE — Assessment & Plan Note (Signed)
Updated lab needed at/ before next visit.   

## 2022-12-02 NOTE — Assessment & Plan Note (Signed)
S/s consistent with acute cholecysttis, confirmed gallstones on imaging today Normal amylae amd lipase, shift with high normal WBC Urgent surgery appt, work excuse x 1 day

## 2022-12-04 ENCOUNTER — Other Ambulatory Visit: Payer: Self-pay

## 2022-12-04 DIAGNOSIS — K810 Acute cholecystitis: Secondary | ICD-10-CM

## 2022-12-04 LAB — CBC WITH DIFFERENTIAL/PLATELET
Basophils Absolute: 0.1 10*3/uL (ref 0.0–0.2)
Basos: 1 %
EOS (ABSOLUTE): 0.2 10*3/uL (ref 0.0–0.4)
Eos: 3 %
Hematocrit: 39.1 % (ref 34.0–46.6)
Hemoglobin: 13 g/dL (ref 11.1–15.9)
Immature Grans (Abs): 0 10*3/uL (ref 0.0–0.1)
Immature Granulocytes: 0 %
Lymphocytes Absolute: 1.2 10*3/uL (ref 0.7–3.1)
Lymphs: 15 %
MCH: 28.7 pg (ref 26.6–33.0)
MCHC: 33.2 g/dL (ref 31.5–35.7)
MCV: 86 fL (ref 79–97)
Monocytes Absolute: 0.7 10*3/uL (ref 0.1–0.9)
Monocytes: 8 %
Neutrophils Absolute: 5.9 10*3/uL (ref 1.4–7.0)
Neutrophils: 73 %
Platelets: 342 10*3/uL (ref 150–450)
RBC: 4.53 x10E6/uL (ref 3.77–5.28)
RDW: 13 % (ref 11.7–15.4)
WBC: 8 10*3/uL (ref 3.4–10.8)

## 2022-12-04 LAB — AMYLASE: Amylase: 29 U/L — ABNORMAL LOW (ref 31–110)

## 2022-12-04 LAB — BMP8+EGFR
BUN/Creatinine Ratio: 11 (ref 9–23)
BUN: 9 mg/dL (ref 6–24)
CO2: 25 mmol/L (ref 20–29)
Calcium: 9.6 mg/dL (ref 8.7–10.2)
Chloride: 101 mmol/L (ref 96–106)
Creatinine, Ser: 0.82 mg/dL (ref 0.57–1.00)
Glucose: 99 mg/dL (ref 70–99)
Potassium: 3.9 mmol/L (ref 3.5–5.2)
Sodium: 141 mmol/L (ref 134–144)
eGFR: 84 mL/min/{1.73_m2} (ref >59)

## 2022-12-04 LAB — LIPASE: Lipase: 14 U/L (ref 14–72)

## 2022-12-04 NOTE — Progress Notes (Signed)
If she is hurting that bad, she might need to go to the ED. Dr. Arnoldo Morale is on call. I am unfortunately not here tomorrow. I am just worried about her having choledocholithiasis with the CBD size and pain more than anything.

## 2022-12-06 LAB — HEPATIC FUNCTION PANEL
ALT: 20 IU/L (ref 0–32)
AST: 23 IU/L (ref 0–40)
Albumin: 4.3 g/dL (ref 3.8–4.9)
Alkaline Phosphatase: 92 IU/L (ref 44–121)
Bilirubin Total: 0.3 mg/dL (ref 0.0–1.2)
Bilirubin, Direct: 0.12 mg/dL (ref 0.00–0.40)
Total Protein: 7 g/dL (ref 6.0–8.5)

## 2022-12-06 LAB — SPECIMEN STATUS REPORT

## 2022-12-09 ENCOUNTER — Ambulatory Visit: Payer: BC Managed Care – PPO | Admitting: General Surgery

## 2022-12-09 ENCOUNTER — Encounter: Payer: Self-pay | Admitting: General Surgery

## 2022-12-09 ENCOUNTER — Encounter: Payer: Self-pay | Admitting: *Deleted

## 2022-12-09 VITALS — BP 127/76 | HR 67 | Temp 98.3°F | Resp 16 | Ht 67.0 in | Wt 220.0 lb

## 2022-12-09 DIAGNOSIS — K8000 Calculus of gallbladder with acute cholecystitis without obstruction: Secondary | ICD-10-CM | POA: Insufficient documentation

## 2022-12-09 DIAGNOSIS — K8020 Calculus of gallbladder without cholecystitis without obstruction: Secondary | ICD-10-CM | POA: Diagnosis not present

## 2022-12-09 NOTE — Patient Instructions (Signed)
Minimally Invasive Cholecystectomy Minimally invasive cholecystectomy is surgery to remove the gallbladder. The gallbladder is a pear-shaped organ that lies beneath the liver on the right side of the body. The gallbladder stores bile, which is a fluid that helps the body digest fats. Cholecystectomy is often done to treat inflammation (irritation and swelling) of the gallbladder (cholecystitis). This condition is usually caused by a buildup of gallstones (cholelithiasis) in the gallbladder or when the fluid in the gall bladder becomes stagnant because gallstones get stuck in the ducts (tubes) and block the flow of bile. This can result in inflammation and pain. In severe cases, emergency surgery may be required. This procedure is done through small incisions in the abdomen, instead of one large incision. It is also called laparoscopic surgery. A thin scope with a camera (laparoscope) is inserted through one incision. Then surgical instruments are inserted through the other incisions. In some cases, a minimally invasive surgery may need to be changed to a surgery that is done through a larger incision. This is called open surgery. Tell a health care provider about: Any allergies you have. All medicines you are taking, including vitamins, herbs, eye drops, creams, and over-the-counter medicines. Any problems you or family members have had with anesthetic medicines. Any bleeding problems you have. Any surgeries you have had. Any medical conditions you have. Whether you are pregnant or may be pregnant. What are the risks? Generally, this is a safe procedure. However, problems may occur, including: Infection. Bleeding. Allergic reactions to medicines. Damage to nearby structures or organs. A gallstone remaining in the common bile duct. The common bile duct carries bile from the gallbladder to the small intestine. A bile leak from the liver or cystic duct after your gallbladder is removed. What happens  before the procedure? Medicines Ask your health care provider about: Changing or stopping your regular medicines. This is especially important if you are taking diabetes medicines or blood thinners. Taking medicines such as aspirin and ibuprofen. These medicines can thin your blood. Do not take these medicines unless your health care provider tells you to take them. Taking over-the-counter medicines, vitamins, herbs, and supplements. General instructions If you will be going home right after the procedure, plan to have a responsible adult: Take you home from the hospital or clinic. You will not be allowed to drive. Care for you for the time you are told. Do not use any products that contain nicotine or tobacco for at least 4 weeks before the procedure. These products include cigarettes, chewing tobacco, and vaping devices, such as e-cigarettes. If you need help quitting, ask your health care provider. Ask your health care provider: How your surgery site will be marked. What steps will be taken to help prevent infection. These may include: Removing hair at the surgery site. Washing skin with a germ-killing soap. Taking antibiotic medicine. What happens during the procedure?  An IV will be inserted into one of your veins. You will be given one or both of the following: A medicine to help you relax (sedative). A medicine to make you fall asleep (general anesthetic). Your surgeon will make several small incisions in your abdomen. The laparoscope will be inserted through one of the small incisions. The camera on the laparoscope will send images to a monitor in the operating room. This lets your surgeon see inside your abdomen. A gas will be pumped into your abdomen. This will expand your abdomen to give the surgeon more room to perform the surgery. Other tools that  are needed for the procedure will be inserted through the other incisions. The gallbladder will be removed through one of the  incisions. Your common bile duct may be examined. If stones are found in the common bile duct, they may be removed. After your gallbladder has been removed, the incisions will be closed with stitches (sutures), staples, or skin glue. Your incisions will be covered with a bandage (dressing). The procedure may vary among health care providers and hospitals. What happens after the procedure? Your blood pressure, heart rate, breathing rate, and blood oxygen level will be monitored until you leave the hospital or clinic. You will be given medicines as needed to control your pain. You may have a drain placed in the incision. The drain will be removed a day or two after the procedure. Summary Minimally invasive cholecystectomy, also called laparoscopic cholecystectomy, is surgery to remove the gallbladder using small incisions. Tell your health care provider about all the medical conditions you have and all the medicines you are taking for those conditions. Before the procedure, follow instructions about when to stop eating and drinking and changing or stopping medicines. Plan to have a responsible adult care for you for the time you are told after you leave the hospital or clinic. This information is not intended to replace advice given to you by your health care provider. Make sure you discuss any questions you have with your health care provider. Document Revised: 04/02/2021 Document Reviewed: 04/02/2021 Elsevier Patient Education  [place].

## 2022-12-09 NOTE — Progress Notes (Unsigned)
[facility] History and Physical  Reason for Referral:*** Referring Physician: ***  Chief Complaint   New Patient (Initial Visit)     [name] is a 57 y.o. female.  HPI: ***.  The *** started *** and has had a duration of ***.  It is associated with ***.  The *** is improved with ***, and is made worse with ***.    Quality*** Context***  Past Medical History:  Diagnosis Date   Asthma 2010   Eczema since childhoosd   flare since 2011   Fibroids    Hypertension    Menorrhagia     Past Surgical History:  Procedure Laterality Date   ANKLE SURGERY     BACK SURGERY     COLONOSCOPY N/A 04/27/2020   Procedure: COLONOSCOPY;  Surgeon: Daneil Dolin, MD;  Location: [facility];  Service: Endoscopy;  Laterality: N/A;  7:30   KNEE SURGERY     TUBAL LIGATION      Family History  Problem Relation Age of Onset   Asthma Paternal Aunt    Asthma Paternal Uncle    Liver disease Paternal Grandfather    Aneurysm Maternal Grandfather        brain   Heart attack Father     Social History   Tobacco Use   Smoking status: Never   Smokeless tobacco: Never  Vaping Use   Vaping Use: Never used  Substance Use Topics   Alcohol use: Yes    Comment: 1 glass of wine twice a week   Drug use: No    Medications: {medication reviewed/display:3041432} Allergies as of 12/09/2022       Reactions   Bactrim [sulfamethoxazole-trimethoprim] Other (See Comments)   Numbness, mouth pain, tightness in throat   Penicillins    Has patient had a PCN reaction causing immediate rash, facial/tongue/throat swelling, SOB or lightheadedness with hypotension: Yes Has patient had a PCN reaction causing severe rash involving mucus membranes or skin necrosis: No Has patient had a PCN reaction that required hospitalization No Has patient had a PCN reaction occurring within the last 10 years: NoN If all of the above answers are "NO", then may proceed with Cephalosporin use. REACTION:  At age 35 had heel sore and was put on R   Latex Rash        Medication List        Accurate as of December 09, 2022 10:12 AM. If you have any questions, ask your nurse or doctor.          albuterol 108 (90 Base) MCG/ACT inhaler Commonly known as: VENTOLIN HFA Inhale 2 puffs into the lungs every 6 (six) hours as needed for wheezing or shortness of breath.   amLODipine 10 MG tablet Commonly known as: NORVASC TAKE 1 TABLET BY MOUTH EVERY DAY   dicyclomine 10 MG capsule Commonly known as: BENTYL Take 1 capsule (10 mg total) by mouth 4 (four) times daily -  before meals and at bedtime for 30 doses.   ergocalciferol 1.25 MG (50000 UT) capsule Commonly known as: VITAMIN D2 Take 1 capsule (50,000 Units total) by mouth once a week. One capsule once weekly   levothyroxine 100 MCG tablet Commonly known as: SYNTHROID TAKE 1 TABLET BY MOUTH EVERY DAY   montelukast 10 MG tablet Commonly known as: SINGULAIR Take 1 tablet (10 mg total) by mouth daily.   rosuvastatin 5 MG tablet Commonly known as: Crestor Take 1 tablet (5 mg total) by mouth daily.  ROS:  {Review of Systems:30496}  Blood pressure 127/76, pulse 67, temperature 98.3 F (36.8 C), temperature source Oral, resp. rate 16, height '5\' 7"'$  (1.702 m), weight 220 lb (99.8 kg), SpO2 94 %. Physical Exam  Results:   Latest Reference Range & Units 12/04/22 11:37  Sodium 134 - 144 mmol/L 141  Potassium 3.5 - 5.2 mmol/L 3.9  Chloride 96 - 106 mmol/L 101  CO2 20 - 29 mmol/L 25  Glucose 70 - 99 mg/dL 99  BUN 6 - 24 mg/dL 9  Creatinine 0.57 - 1.00 mg/dL 0.82  Calcium 8.7 - 10.2 mg/dL 9.6  BUN/Creatinine Ratio 9 - 23  11  eGFR >59 mL/min/1.73 84  Alkaline Phosphatase 44 - 121 IU/L 92  Albumin 3.8 - 4.9 g/dL 4.3  Amylase, Serum 31 - 110 U/L 29 (L)  Lipase 14 - 72 U/L 14  AST 0 - 40 IU/L 23  ALT 0 - 32 IU/L 20  Total Protein 6.0 - 8.5 g/dL 7.0  Total Bilirubin 0.0 - 1.2 mg/dL 0.3  (L): Data is abnormally  low  CLINICAL DATA:  Right upper quadrant pain   EXAM: ULTRASOUND ABDOMEN LIMITED RIGHT UPPER QUADRANT   COMPARISON:  11/12/2010   FINDINGS: Gallbladder:   Numerous gallstones layering within the gallbladder. Gallbladder wall is thickened to 6 mm. Negative sonographic Murphy's.   Common bile duct:   Diameter: Upper limits normal, 6-7 mm.   Liver:   Increased echotexture compatible with fatty infiltration. No focal abnormality or biliary ductal dilatation. Portal vein is patent on color Doppler imaging with normal direction of blood flow towards the liver.   Other: None.   IMPRESSION: Fatty infiltration of the liver.   Numerous layering gallstones within the gallbladder with gallbladder wall thickening. Recommend clinical correlation to exclude acute cholecystitis.     Electronically Signed   By: Rolm Baptise M.D.   On: 12/02/2022 11:08   Assessment & Plan:  [name] is a 57 y.o. female with *** -*** -*** -Follow up ***  All questions were answered to the satisfaction of the patient and family***.  The risk and benefits of *** were discussed including but not limited to ***.  After careful consideration, [name] has decided to ***.    Virl Cagey 12/09/2022, 10:12 AM

## 2022-12-09 NOTE — Patient Instructions (Signed)
[name]  12/09/2022     '@[email or web address]'$ @   Your procedure is scheduled on  12/12/2022.   Report to [facility] at  [email or web address].   Call this number if you have problems the morning of surgery:  [phone]  If you experience any cold or flu symptoms such as cough, fever, chills, shortness of breath, etc. between now and your scheduled surgery, please notify us at the above number.   Remember:  Do not eat or drink after midnight.        Use your inhaler before you come and bring your rescue inhaler with you.     Take these medicines the morning of surgery with A SIP OF WATER                  amlodipine, levothyroxine, singulair.     Do not wear jewelry, make-up or nail polish.  Do not wear lotions, powders, or perfumes, or deodorant.  Do not shave 48 hours prior to surgery.  Men may shave face and neck.  Do not bring valuables to the hospital.  [facility] is not responsible for any belongings or valuables.  Contacts, dentures or bridgework may not be worn into surgery.  Leave your suitcase in the car.  After surgery it may be brought to your room.  For patients admitted to the hospital, discharge time will be determined by your treatment team.  Patients discharged the day of surgery will not be allowed to drive home and must have someone with them for 24 hours.    Special instructions:   DO NOT smoke tobacco or vape for 24 hours before your procedure.  Please read over the following fact sheets that you were given. Coughing and Deep Breathing, Surgical Site Infection Prevention, Anesthesia Post-op Instructions, and Care and Recovery After Surgery      Minimally Invasive Cholecystectomy, Care After The following information offers guidance on how to care for yourself after your procedure. Your health care provider may also give you more specific instructions. If you have problems or questions, contact your health care provider. What can I expect  after the procedure? After the procedure, it is common to have: Pain at your incision sites. You will be given medicines to control this pain. Mild nausea or vomiting. Bloating and possible shoulder pain from the gas that was used during the procedure. Follow these instructions at home: Medicines Take over-the-counter and prescription medicines only as told by your health care provider. If you were prescribed an antibiotic medicine, take it as told by your health care provider. Do not stop using the antibiotic even if you start to feel better. Ask your health care provider if the medicine prescribed to you: Requires you to avoid driving or using machinery. Can cause constipation. You may need to take these actions to prevent or treat constipation: Drink enough fluid to keep your urine pale yellow. Take over-the-counter or prescription medicines. Eat foods that are high in fiber, such as beans, whole grains, and fresh fruits and vegetables. Limit foods that are high in fat and processed sugars, such as fried or sweet foods. Incision care  Follow instructions from your health care provider about how to take care of your incisions. Make sure you: Wash your hands with soap and water for at least 20 seconds before and after you change your bandage (dressing). If soap and water are not available, use hand sanitizer. Change your dressing as  told by your health care provider. Leave stitches (sutures), skin glue, or adhesive strips in place. These skin closures may need to be in place for 2 weeks or longer. If adhesive strip edges start to loosen and curl up, you may trim the loose edges. Do not remove adhesive strips completely unless your health care provider tells you to do that. Do not take baths, swim, or use a hot tub until your health care provider approves. Ask your health care provider if you may take showers. You may only be allowed to take sponge baths. Check your incision area every day for  signs of infection. Check for: More redness, swelling, or pain. Fluid or blood. Warmth. Pus or a bad smell. Activity Rest as told by your health care provider. Do not do activities that require a lot of effort. Avoid sitting for a long time without moving. Get up to take short walks every 1-2 hours. This is important to improve blood flow and breathing. Ask for help if you feel weak or unsteady. Do not lift anything that is heavier than 10 lb (4.5 kg), or the limit that you are told, until your health care provider says that it is safe. Do not play contact sports until your health care provider approves. Do not return to work or school until your health care provider approves. Return to your normal activities as told by your health care provider. Ask your health care provider what activities are safe for you. General instructions If you were given a sedative during the procedure, it can affect you for several hours. Do not drive or operate machinery until your health care provider says that it is safe. Keep all follow-up visits. This is important. Contact a health care provider if: You develop a rash. You have more redness, swelling, or pain around your incisions. You have fluid or blood coming from your incisions. Your incisions feel warm to the touch. You have pus or a bad smell coming from your incisions. You have a fever. One or more of your incisions breaks open. Get help right away if: You have trouble breathing. You have chest pain. You have more pain in your shoulders. You faint or feel dizzy when you stand. You have severe pain in your abdomen. You have nausea or vomiting that lasts for more than one day. You have leg pain that is new or unusual, or if it is localized to one specific spot. These symptoms may represent a serious problem that is an emergency. Do not wait to see if the symptoms will go away. Get medical help right away. Call your local emergency services (911 in  the [place] Do not drive yourself to the hospital. Summary After your procedure, it is common to have pain at the incision sites. You may also have nausea or bloating. Follow your health care provider's instructions about medicine, activity restrictions, and caring for your incision areas. Do not do activities that require a lot of effort. Contact a health care provider if you have a fever or other signs of infection, such as more redness, swelling, or pain around the incisions. Get help right away if you have chest pain, increasing pain in the shoulders, or trouble breathing. This information is not intended to replace advice given to you by your health care provider. Make sure you discuss any questions you have with your health care provider. Document Revised: 04/02/2021 Document Reviewed: 04/02/2021 Elsevier Patient Education  [place] Anesthesia, Adult, Care After The  following information offers guidance on how to care for yourself after your procedure. Your health care provider may also give you more specific instructions. If you have problems or questions, contact your health care provider. What can I expect after the procedure? After the procedure, it is common for people to: Have pain or discomfort at the IV site. Have nausea or vomiting. Have a sore throat or hoarseness. Have trouble concentrating. Feel cold or chills. Feel weak, sleepy, or tired (fatigue). Have soreness and body aches. These can affect parts of the body that were not involved in surgery. Follow these instructions at home: For the time period you were told by your health care provider:  Rest. Do not participate in activities where you could fall or become injured. Do not drive or use machinery. Do not drink alcohol. Do not take sleeping pills or medicines that cause drowsiness. Do not make important decisions or sign legal documents. Do not take care of children on your own. General  instructions Drink enough fluid to keep your urine pale yellow. If you have sleep apnea, surgery and certain medicines can increase your risk for breathing problems. Follow instructions from your health care provider about wearing your sleep device: Anytime you are sleeping, including during daytime naps. While taking prescription pain medicines, sleeping medicines, or medicines that make you drowsy. Return to your normal activities as told by your health care provider. Ask your health care provider what activities are safe for you. Take over-the-counter and prescription medicines only as told by your health care provider. Do not use any products that contain nicotine or tobacco. These products include cigarettes, chewing tobacco, and vaping devices, such as e-cigarettes. These can delay incision healing after surgery. If you need help quitting, ask your health care provider. Contact a health care provider if: You have nausea or vomiting that does not get better with medicine. You vomit every time you eat or drink. You have pain that does not get better with medicine. You cannot urinate or have bloody urine. You develop a skin rash. You have a fever. Get help right away if: You have trouble breathing. You have chest pain. You vomit blood. These symptoms may be an emergency. Get help right away. Call 911. Do not wait to see if the symptoms will go away. Do not drive yourself to the hospital. Summary After the procedure, it is common to have a sore throat, hoarseness, nausea, vomiting, or to feel weak, sleepy, or fatigue. For the time period you were told by your health care provider, do not drive or use machinery. Get help right away if you have difficulty breathing, have chest pain, or vomit blood. These symptoms may be an emergency. This information is not intended to replace advice given to you by your health care provider. Make sure you discuss any questions you have with your health  care provider. Document Revised: 12/27/2021 Document Reviewed: 12/27/2021 Elsevier Patient Education  [place]. How to Use Chlorhexidine Before Surgery Chlorhexidine gluconate (CHG) is a germ-killing (antiseptic) solution that is used to clean the skin. It can get rid of the bacteria that normally live on the skin and can keep them away for about 24 hours. To clean your skin with CHG, you may be given: A CHG solution to use in the shower or as part of a sponge bath. A prepackaged cloth that contains CHG. Cleaning your skin with CHG may help lower the risk for infection: While you are staying in the intensive  care unit of the hospital. If you have a vascular access, such as a central line, to provide short-term or long-term access to your veins. If you have a catheter to drain urine from your bladder. If you are on a ventilator. A ventilator is a machine that helps you breathe by moving air in and out of your lungs. After surgery. What are the risks? Risks of using CHG include: A skin reaction. Hearing loss, if CHG gets in your ears and you have a perforated eardrum. Eye injury, if CHG gets in your eyes and is not rinsed out. The CHG product catching fire. Make sure that you avoid smoking and flames after applying CHG to your skin. Do not use CHG: If you have a chlorhexidine allergy or have previously reacted to chlorhexidine. On babies younger than 23 months of age. How to use CHG solution Use CHG only as told by your health care provider, and follow the instructions on the label. Use the full amount of CHG as directed. Usually, this is one bottle. During a shower Follow these steps when using CHG solution during a shower (unless your health care provider gives you different instructions): Start the shower. Use your normal soap and shampoo to wash your face and hair. Turn off the shower or move out of the shower stream. Pour the CHG onto a clean washcloth. Do not use any type  of brush or rough-edged sponge. Starting at your neck, lather your body down to your toes. Make sure you follow these instructions: If you will be having surgery, pay special attention to the part of your body where you will be having surgery. Scrub this area for at least 1 minute. Do not use CHG on your head or face. If the solution gets into your ears or eyes, rinse them well with water. Avoid your genital area. Avoid any areas of skin that have broken skin, cuts, or scrapes. Scrub your back and under your arms. Make sure to wash skin folds. Let the lather sit on your skin for 1-2 minutes or as long as told by your health care provider. Thoroughly rinse your entire body in the shower. Make sure that all body creases and crevices are rinsed well. Dry off with a clean towel. Do not put any substances on your body afterward--such as powder, lotion, or perfume--unless you are told to do so by your health care provider. Only use lotions that are recommended by the manufacturer. Put on clean clothes or pajamas. If it is the night before your surgery, sleep in clean sheets.  During a sponge bath Follow these steps when using CHG solution during a sponge bath (unless your health care provider gives you different instructions): Use your normal soap and shampoo to wash your face and hair. Pour the CHG onto a clean washcloth. Starting at your neck, lather your body down to your toes. Make sure you follow these instructions: If you will be having surgery, pay special attention to the part of your body where you will be having surgery. Scrub this area for at least 1 minute. Do not use CHG on your head or face. If the solution gets into your ears or eyes, rinse them well with water. Avoid your genital area. Avoid any areas of skin that have broken skin, cuts, or scrapes. Scrub your back and under your arms. Make sure to wash skin folds. Let the lather sit on your skin for 1-2 minutes or as long as told by  your  health care provider. Using a different clean, wet washcloth, thoroughly rinse your entire body. Make sure that all body creases and crevices are rinsed well. Dry off with a clean towel. Do not put any substances on your body afterward--such as powder, lotion, or perfume--unless you are told to do so by your health care provider. Only use lotions that are recommended by the manufacturer. Put on clean clothes or pajamas. If it is the night before your surgery, sleep in clean sheets. How to use CHG prepackaged cloths Only use CHG cloths as told by your health care provider, and follow the instructions on the label. Use the CHG cloth on clean, dry skin. Do not use the CHG cloth on your head or face unless your health care provider tells you to. When washing with the CHG cloth: Avoid your genital area. Avoid any areas of skin that have broken skin, cuts, or scrapes. Before surgery Follow these steps when using a CHG cloth to clean before surgery (unless your health care provider gives you different instructions): Using the CHG cloth, vigorously scrub the part of your body where you will be having surgery. Scrub using a back-and-forth motion for 3 minutes. The area on your body should be completely wet with CHG when you are done scrubbing. Do not rinse. Discard the cloth and let the area air-dry. Do not put any substances on the area afterward, such as powder, lotion, or perfume. Put on clean clothes or pajamas. If it is the night before your surgery, sleep in clean sheets.  For general bathing Follow these steps when using CHG cloths for general bathing (unless your health care provider gives you different instructions). Use a separate CHG cloth for each area of your body. Make sure you wash between any folds of skin and between your fingers and toes. Wash your body in the following order, switching to a new cloth after each step: The front of your neck, shoulders, and chest. Both of your  arms, under your arms, and your hands. Your stomach and groin area, avoiding the genitals. Your right leg and foot. Your left leg and foot. The back of your neck, your back, and your buttocks. Do not rinse. Discard the cloth and let the area air-dry. Do not put any substances on your body afterward--such as powder, lotion, or perfume--unless you are told to do so by your health care provider. Only use lotions that are recommended by the manufacturer. Put on clean clothes or pajamas. Contact a health care provider if: Your skin gets irritated after scrubbing. You have questions about using your solution or cloth. You swallow any chlorhexidine. Call your local poison control center (1-(442)198-0244 in the [place] Get help right away if: Your eyes itch badly, or they become very red or swollen. Your skin itches badly and is red or swollen. Your hearing changes. You have trouble seeing. You have swelling or tingling in your mouth or throat. You have trouble breathing. These symptoms may represent a serious problem that is an emergency. Do not wait to see if the symptoms will go away. Get medical help right away. Call your local emergency services (911 in the [place] Do not drive yourself to the hospital. Summary Chlorhexidine gluconate (CHG) is a germ-killing (antiseptic) solution that is used to clean the skin. Cleaning your skin with CHG may help to lower your risk for infection. You may be given CHG to use for bathing. It may be in a bottle or in a prepackaged cloth to  use on your skin. Carefully follow your health care provider's instructions and the instructions on the product label. Do not use CHG if you have a chlorhexidine allergy. Contact your health care provider if your skin gets irritated after scrubbing. This information is not intended to replace advice given to you by your health care provider. Make sure you discuss any questions you have with your health care provider. Document  Revised: 01/27/2022 Document Reviewed: 12/10/2020 Elsevier Patient Education  [place].

## 2022-12-10 ENCOUNTER — Encounter (HOSPITAL_COMMUNITY): Payer: Self-pay

## 2022-12-10 ENCOUNTER — Encounter (HOSPITAL_COMMUNITY)
Admission: RE | Admit: 2022-12-10 | Discharge: 2022-12-10 | Disposition: A | Discharge status: Home / Self Care | Payer: BC Managed Care – PPO | Source: Ambulatory Visit | Attending: General Surgery | Admitting: General Surgery

## 2022-12-10 ENCOUNTER — Other Ambulatory Visit: Payer: Self-pay

## 2022-12-10 VITALS — BP 127/78 | HR 67 | Temp 98.3°F | Resp 18 | Ht 67.0 in | Wt 220.0 lb

## 2022-12-10 DIAGNOSIS — Z01818 Encounter for other preprocedural examination: Secondary | ICD-10-CM | POA: Insufficient documentation

## 2022-12-10 DIAGNOSIS — I10 Essential (primary) hypertension: Secondary | ICD-10-CM | POA: Diagnosis not present

## 2022-12-10 DIAGNOSIS — J45909 Unspecified asthma, uncomplicated: Secondary | ICD-10-CM | POA: Diagnosis not present

## 2022-12-10 DIAGNOSIS — E039 Hypothyroidism, unspecified: Secondary | ICD-10-CM | POA: Diagnosis not present

## 2022-12-10 DIAGNOSIS — K8010 Calculus of gallbladder with chronic cholecystitis without obstruction: Secondary | ICD-10-CM | POA: Diagnosis present

## 2022-12-10 LAB — POCT PREGNANCY, URINE: Preg Test, Ur: NEGATIVE

## 2022-12-10 NOTE — H&P (Signed)
[facility] History and Physical  Reason for Referral: Gallstones, ? Cholecystitis  Referring Physician:  Dr. Moshe Cipro   Chief Complaint   New Patient (Initial Visit)     [name] is a 57 y.o. female.  HPI:  [name] is a 57 yo who has recently been having RUQ/ epigastric pain with some associated nausea. This all started last week and was sharp in nature and radiated to her side and back. She had an [place] and labs with Dr. Moshe Cipro showing gallstones, possible wall thickening and normal LFTs.  She says that she is better now but has been avoid heavy foods.  She had a similar episode months ago and did not realize then that this was her gallbladder.   Past Medical History:  Diagnosis Date   Asthma 2010   Eczema since childhoosd   flare since 2011   Fibroids    Hypertension    Menorrhagia     Past Surgical History:  Procedure Laterality Date   ANKLE SURGERY     BACK SURGERY     COLONOSCOPY N/A 04/27/2020   Procedure: COLONOSCOPY;  Surgeon: Daneil Dolin, MD;  Location: [facility];  Service: Endoscopy;  Laterality: N/A;  7:30   KNEE SURGERY     TUBAL LIGATION      Family History  Problem Relation Age of Onset   Asthma Paternal Aunt    Asthma Paternal Uncle    Liver disease Paternal Grandfather    Aneurysm Maternal Grandfather        brain   Heart attack Father     Social History   Tobacco Use   Smoking status: Never   Smokeless tobacco: Never  Vaping Use   Vaping Use: Never used  Substance Use Topics   Alcohol use: Yes    Comment: 1 glass of wine twice a week   Drug use: No    Medications: I have reviewed the patient's current medications. Allergies as of 12/09/2022       Reactions   Bactrim [sulfamethoxazole-trimethoprim] Other (See Comments)   Numbness, mouth pain, tightness in throat   Penicillins    Has patient had a PCN reaction causing immediate rash, facial/tongue/throat swelling, SOB or lightheadedness with hypotension:  Yes Has patient had a PCN reaction causing severe rash involving mucus membranes or skin necrosis: No Has patient had a PCN reaction that required hospitalization No Has patient had a PCN reaction occurring within the last 10 years: NoN If all of the above answers are "NO", then may proceed with Cephalosporin use. REACTION: At age 13 had heel sore and was put on R   Latex Rash        Medication List        Accurate as of December 09, 2022 10:12 AM. If you have any questions, ask your nurse or doctor.          albuterol 108 (90 Base) MCG/ACT inhaler Commonly known as: VENTOLIN HFA Inhale 2 puffs into the lungs every 6 (six) hours as needed for wheezing or shortness of breath.   amLODipine 10 MG tablet Commonly known as: NORVASC TAKE 1 TABLET BY MOUTH EVERY DAY   dicyclomine 10 MG capsule Commonly known as: BENTYL Take 1 capsule (10 mg total) by mouth 4 (four) times daily -  before meals and at bedtime for 30 doses.   ergocalciferol 1.25 MG (50000 UT) capsule Commonly known as: VITAMIN D2 Take 1 capsule (50,000 Units total) by mouth once a  week. One capsule once weekly   levothyroxine 100 MCG tablet Commonly known as: SYNTHROID TAKE 1 TABLET BY MOUTH EVERY DAY   montelukast 10 MG tablet Commonly known as: SINGULAIR Take 1 tablet (10 mg total) by mouth daily.   rosuvastatin 5 MG tablet Commonly known as: Crestor Take 1 tablet (5 mg total) by mouth daily.         ROS:  A comprehensive review of systems was negative except for: Respiratory: positive for cough and asthma Gastrointestinal: positive for abdominal pain and nausea Musculoskeletal: positive for joint pain  Blood pressure 127/76, pulse 67, temperature 98.3 F (36.8 C), temperature source Oral, resp. rate 16, height '5\' 7"'$  (1.702 m), weight 220 lb (99.8 kg), SpO2 94 %. Physical Exam Vitals reviewed.  Constitutional:      Appearance: Normal appearance.  HENT:     Head: Normocephalic.     Nose: Nose  normal.     Mouth/Throat:     Mouth: Mucous membranes are moist.  Eyes:     Extraocular Movements: Extraocular movements intact.  Cardiovascular:     Rate and Rhythm: Normal rate and regular rhythm.  Pulmonary:     Effort: Pulmonary effort is normal.     Breath sounds: Normal breath sounds.  Abdominal:     General: There is no distension.     Palpations: Abdomen is soft.     Tenderness: There is abdominal tenderness in the right upper quadrant and epigastric area.  Musculoskeletal:        General: No swelling. Normal range of motion.     Cervical back: Normal range of motion.  Skin:    General: Skin is warm.  Neurological:     General: No focal deficit present.     Mental Status: She is alert and oriented to person, place, and time.  Psychiatric:        Mood and Affect: Mood normal.        Behavior: Behavior normal.     Results:   Latest Reference Range & Units 12/04/22 11:37  Sodium 134 - 144 mmol/L 141  Potassium 3.5 - 5.2 mmol/L 3.9  Chloride 96 - 106 mmol/L 101  CO2 20 - 29 mmol/L 25  Glucose 70 - 99 mg/dL 99  BUN 6 - 24 mg/dL 9  Creatinine 0.57 - 1.00 mg/dL 0.82  Calcium 8.7 - 10.2 mg/dL 9.6  BUN/Creatinine Ratio 9 - 23  11  eGFR >59 mL/min/1.73 84  Alkaline Phosphatase 44 - 121 IU/L 92  Albumin 3.8 - 4.9 g/dL 4.3  Amylase, Serum 31 - 110 U/L 29 (L)  Lipase 14 - 72 U/L 14  AST 0 - 40 IU/L 23  ALT 0 - 32 IU/L 20  Total Protein 6.0 - 8.5 g/dL 7.0  Total Bilirubin 0.0 - 1.2 mg/dL 0.3  (L): Data is abnormally low  CLINICAL DATA:  Right upper quadrant pain   EXAM: ULTRASOUND ABDOMEN LIMITED RIGHT UPPER QUADRANT   COMPARISON:  11/12/2010   FINDINGS: Gallbladder:   Numerous gallstones layering within the gallbladder. Gallbladder wall is thickened to 6 mm. Negative sonographic Murphy's.   Common bile duct:   Diameter: Upper limits normal, 6-7 mm.   Liver:   Increased echotexture compatible with fatty infiltration. No focal abnormality or biliary  ductal dilatation. Portal vein is patent on color Doppler imaging with normal direction of blood flow towards the liver.   Other: None.   IMPRESSION: Fatty infiltration of the liver.   Numerous layering gallstones within the  gallbladder with gallbladder wall thickening. Recommend clinical correlation to exclude acute cholecystitis.     Electronically Signed   By: Rolm Baptise M.D.   On: 12/02/2022 11:08   Assessment & Plan:  [name] is a 57 y.o. female with gallstones and continued symptoms causing her to have to change her diet.    PLAN: I counseled the patient about the indication, risks and benefits of robotic assisted laparoscopic cholecystectomy.  She understands there is a very small chance for bleeding, infection, injury to normal structures (including common bile duct), conversion to open surgery, persistent symptoms, evolution of postcholecystectomy diarrhea, need for secondary interventions, anesthesia reaction, cardiopulmonary issues and other risks not specifically detailed here. I described the expected recovery, the plan for follow-up and the restrictions during the recovery phase.  All questions were answered.   All questions were answered to the satisfaction of the patient.   Virl Cagey 12/09/2022, 10:12 AM

## 2022-12-12 ENCOUNTER — Ambulatory Visit (HOSPITAL_COMMUNITY): Payer: BC Managed Care – PPO | Admitting: Anesthesiology

## 2022-12-12 ENCOUNTER — Encounter (HOSPITAL_COMMUNITY)
Admission: RE | Disposition: A | Discharge status: Home / Self Care | Payer: Self-pay | Source: Home / Self Care | Attending: General Surgery

## 2022-12-12 ENCOUNTER — Ambulatory Visit (HOSPITAL_COMMUNITY)
Admission: RE | Admit: 2022-12-12 | Discharge: 2022-12-12 | Disposition: A | Discharge status: Home / Self Care | Payer: BC Managed Care – PPO | Attending: General Surgery | Admitting: General Surgery

## 2022-12-12 ENCOUNTER — Encounter (HOSPITAL_COMMUNITY): Payer: Self-pay | Admitting: General Surgery

## 2022-12-12 ENCOUNTER — Other Ambulatory Visit: Payer: Self-pay

## 2022-12-12 DIAGNOSIS — K8000 Calculus of gallbladder with acute cholecystitis without obstruction: Secondary | ICD-10-CM | POA: Diagnosis not present

## 2022-12-12 DIAGNOSIS — K8010 Calculus of gallbladder with chronic cholecystitis without obstruction: Secondary | ICD-10-CM | POA: Diagnosis not present

## 2022-12-12 DIAGNOSIS — Z01818 Encounter for other preprocedural examination: Secondary | ICD-10-CM

## 2022-12-12 DIAGNOSIS — J45909 Unspecified asthma, uncomplicated: Secondary | ICD-10-CM | POA: Insufficient documentation

## 2022-12-12 DIAGNOSIS — E039 Hypothyroidism, unspecified: Secondary | ICD-10-CM | POA: Insufficient documentation

## 2022-12-12 DIAGNOSIS — K8020 Calculus of gallbladder without cholecystitis without obstruction: Secondary | ICD-10-CM | POA: Diagnosis present

## 2022-12-12 DIAGNOSIS — I10 Essential (primary) hypertension: Secondary | ICD-10-CM

## 2022-12-12 SURGERY — CHOLECYSTECTOMY, ROBOT-ASSISTED, LAPAROSCOPIC
Anesthesia: General | Site: Abdomen

## 2022-12-12 MED ORDER — CHLORHEXIDINE GLUCONATE 0.12 % MT SOLN
15.0000 mL | Freq: Once | OROMUCOSAL | Status: AC
  Administered 2022-12-12: 15 mL via OROMUCOSAL

## 2022-12-12 MED ORDER — FENTANYL CITRATE (PF) 250 MCG/5ML IJ SOLN
INTRAMUSCULAR | Status: AC
  Filled 2022-12-12: qty 5

## 2022-12-12 MED ORDER — ONDANSETRON HCL 4 MG/2ML IJ SOLN
INTRAMUSCULAR | Status: DC | PRN
  Administered 2022-12-12: 4 mg via INTRAVENOUS

## 2022-12-12 MED ORDER — MEPERIDINE HCL 50 MG/ML IJ SOLN: 6.2500 mg | INTRAMUSCULAR | Status: DC | PRN

## 2022-12-12 MED ORDER — INDOCYANINE GREEN 25 MG IV SOLR
INTRAVENOUS | Status: AC
  Administered 2022-12-12: 2.5 mg via INTRAVENOUS
  Filled 2022-12-12: qty 10

## 2022-12-12 MED ORDER — CIPROFLOXACIN IN D5W 400 MG/200ML IV SOLN
INTRAVENOUS | Status: AC
  Filled 2022-12-12: qty 200

## 2022-12-12 MED ORDER — KETOROLAC TROMETHAMINE 30 MG/ML IJ SOLN
INTRAMUSCULAR | Status: AC
  Filled 2022-12-12: qty 1

## 2022-12-12 MED ORDER — PROPOFOL 10 MG/ML IV BOLUS
INTRAVENOUS | Status: DC | PRN
  Administered 2022-12-12: 200 mg via INTRAVENOUS

## 2022-12-12 MED ORDER — FENTANYL CITRATE (PF) 250 MCG/5ML IJ SOLN
INTRAMUSCULAR | Status: DC | PRN
  Administered 2022-12-12 (×4): 50 ug via INTRAVENOUS

## 2022-12-12 MED ORDER — ORAL CARE MOUTH RINSE: 15.0000 mL | Freq: Once | OROMUCOSAL | Status: AC

## 2022-12-12 MED ORDER — HYDROMORPHONE HCL 1 MG/ML IJ SOLN
INTRAMUSCULAR | Status: AC
  Filled 2022-12-12: qty 0.5

## 2022-12-12 MED ORDER — METRONIDAZOLE 500 MG PO TABS: 500.0000 mg | ORAL_TABLET | Freq: Three times a day (TID) | ORAL | 0 refills | Status: AC

## 2022-12-12 MED ORDER — CHLORHEXIDINE GLUCONATE CLOTH 2 % EX PADS: 6.0000 | MEDICATED_PAD | Freq: Once | CUTANEOUS | Status: DC

## 2022-12-12 MED ORDER — DEXMEDETOMIDINE HCL IN NACL 80 MCG/20ML IV SOLN
INTRAVENOUS | Status: DC | PRN
  Administered 2022-12-12: 8 ug via BUCCAL
  Administered 2022-12-12: 4 ug via BUCCAL
  Administered 2022-12-12: 8 ug via BUCCAL

## 2022-12-12 MED ORDER — DEXAMETHASONE SODIUM PHOSPHATE 4 MG/ML IJ SOLN
INTRAMUSCULAR | Status: DC | PRN
  Administered 2022-12-12: 5 mg via INTRAVENOUS

## 2022-12-12 MED ORDER — PROPOFOL 10 MG/ML IV BOLUS
INTRAVENOUS | Status: AC
  Filled 2022-12-12: qty 20

## 2022-12-12 MED ORDER — MIDAZOLAM HCL 2 MG/2ML IJ SOLN
INTRAMUSCULAR | Status: AC
  Filled 2022-12-12: qty 2

## 2022-12-12 MED ORDER — HYDROMORPHONE HCL 1 MG/ML IJ SOLN
0.2500 mg | INTRAMUSCULAR | Status: DC | PRN
  Administered 2022-12-12 (×3): 0.5 mg via INTRAVENOUS
  Filled 2022-12-12 (×2): qty 0.5

## 2022-12-12 MED ORDER — SUGAMMADEX SODIUM 200 MG/2ML IV SOLN
INTRAVENOUS | Status: DC | PRN
  Administered 2022-12-12: 200 mg via INTRAVENOUS

## 2022-12-12 MED ORDER — ONDANSETRON HCL 4 MG PO TABS: 4.0000 mg | ORAL_TABLET | Freq: Three times a day (TID) | ORAL | 1 refills | Status: DC | PRN

## 2022-12-12 MED ORDER — IPRATROPIUM-ALBUTEROL 0.5-2.5 (3) MG/3ML IN SOLN
3.0000 mL | Freq: Once | RESPIRATORY_TRACT | Status: AC
  Administered 2022-12-12: 3 mL via RESPIRATORY_TRACT

## 2022-12-12 MED ORDER — BUPIVACAINE LIPOSOME 1.3 % IJ SUSP
INTRAMUSCULAR | Status: AC
  Filled 2022-12-12: qty 20

## 2022-12-12 MED ORDER — KETOROLAC TROMETHAMINE 30 MG/ML IJ SOLN
INTRAMUSCULAR | Status: DC | PRN
  Administered 2022-12-12: 30 mg via INTRAVENOUS

## 2022-12-12 MED ORDER — IPRATROPIUM-ALBUTEROL 0.5-2.5 (3) MG/3ML IN SOLN
RESPIRATORY_TRACT | Status: AC
  Filled 2022-12-12: qty 3

## 2022-12-12 MED ORDER — SODIUM CHLORIDE 0.9 % IR SOLN
Status: DC | PRN
  Administered 2022-12-12: 3000 mL

## 2022-12-12 MED ORDER — BUPIVACAINE LIPOSOME 1.3 % IJ SUSP
INTRAMUSCULAR | Status: DC | PRN
  Administered 2022-12-12: 20 mL

## 2022-12-12 MED ORDER — CHLORHEXIDINE GLUCONATE CLOTH 2 % EX PADS
6.0000 | MEDICATED_PAD | Freq: Once | CUTANEOUS | Status: DC
  Administered 2022-12-12: 6 via TOPICAL

## 2022-12-12 MED ORDER — ONDANSETRON HCL 4 MG/2ML IJ SOLN: 4.0000 mg | Freq: Once | INTRAMUSCULAR | Status: DC | PRN

## 2022-12-12 MED ORDER — LACTATED RINGERS IV SOLN: INTRAVENOUS | Status: DC

## 2022-12-12 MED ORDER — CIPROFLOXACIN IN D5W 400 MG/200ML IV SOLN
400.0000 mg | INTRAVENOUS | Status: AC
  Administered 2022-12-12: 400 mg via INTRAVENOUS

## 2022-12-12 MED ORDER — EPHEDRINE SULFATE-NACL 50-0.9 MG/10ML-% IV SOSY
PREFILLED_SYRINGE | INTRAVENOUS | Status: DC | PRN
  Administered 2022-12-12 (×3): 5 mg via INTRAVENOUS

## 2022-12-12 MED ORDER — LIDOCAINE 2% (20 MG/ML) 5 ML SYRINGE
INTRAMUSCULAR | Status: DC | PRN
  Administered 2022-12-12: 100 mg via INTRAVENOUS

## 2022-12-12 MED ORDER — OXYCODONE HCL 5 MG PO TABS: 5.0000 mg | ORAL_TABLET | ORAL | 0 refills | Status: DC | PRN

## 2022-12-12 MED ORDER — STERILE WATER FOR IRRIGATION IR SOLN
Status: DC | PRN
  Administered 2022-12-12: 1000 mL

## 2022-12-12 MED ORDER — EPHEDRINE 5 MG/ML INJ
INTRAVENOUS | Status: AC
  Filled 2022-12-12: qty 5

## 2022-12-12 MED ORDER — CIPROFLOXACIN HCL 500 MG PO TABS: 500.0000 mg | ORAL_TABLET | Freq: Two times a day (BID) | ORAL | 0 refills | Status: AC

## 2022-12-12 MED ORDER — MIDAZOLAM HCL 2 MG/2ML IJ SOLN
INTRAMUSCULAR | Status: DC | PRN
  Administered 2022-12-12: 2 mg via INTRAVENOUS

## 2022-12-12 MED ORDER — INDOCYANINE GREEN 25 MG IV SOLR
2.5000 mg | Freq: Once | INTRAVENOUS | Status: AC
  Filled 2022-12-12: qty 10

## 2022-12-12 MED ORDER — ROCURONIUM BROMIDE 10 MG/ML (PF) SYRINGE
PREFILLED_SYRINGE | INTRAVENOUS | Status: DC | PRN
  Administered 2022-12-12: 10 mg via INTRAVENOUS
  Administered 2022-12-12: 60 mg via INTRAVENOUS
  Administered 2022-12-12 (×3): 10 mg via INTRAVENOUS

## 2022-12-12 SURGICAL SUPPLY — 52 items
ADH SKN CLS APL DERMABOND .7 (GAUZE/BANDAGES/DRESSINGS) ×1
APL PRP STRL LF DISP 70% ISPRP (MISCELLANEOUS) ×1
BLADE SURG 15 STRL LF DISP TIS (BLADE) ×1 IMPLANT
BLADE SURG 15 STRL SS (BLADE) ×1
CHLORAPREP W/TINT 26 (MISCELLANEOUS) ×1 IMPLANT
CLIP LIGATING HEM O LOK PURPLE (MISCELLANEOUS) ×1 IMPLANT
COVER LIGHT HANDLE STERIS (MISCELLANEOUS) ×2 IMPLANT
COVER TIP SHEARS 8 DVNC (MISCELLANEOUS) ×1 IMPLANT
COVER TIP SHEARS 8MM DA VINCI (MISCELLANEOUS) ×1
DEFOGGER SCOPE WARMER CLEARIFY (MISCELLANEOUS) IMPLANT
DERMABOND ADVANCED .7 DNX12 (GAUZE/BANDAGES/DRESSINGS) ×1 IMPLANT
DRAPE ARM DVNC X/XI (DISPOSABLE) ×4 IMPLANT
DRAPE COLUMN DVNC XI (DISPOSABLE) ×1 IMPLANT
DRAPE DA VINCI XI ARM (DISPOSABLE) ×4
DRAPE DA VINCI XI COLUMN (DISPOSABLE) ×1
DRSG ALLEVYN 2X2 (GAUZE/BANDAGES/DRESSINGS) IMPLANT
DRSG TEGADERM 2-3/8X2-3/4 SM (GAUZE/BANDAGES/DRESSINGS) IMPLANT
ELECT REM PT RETURN 9FT ADLT (ELECTROSURGICAL) ×1
ELECTRODE REM PT RTRN 9FT ADLT (ELECTROSURGICAL) ×1 IMPLANT
GLOVE BIO SURGEON STRL SZ 6.5 (GLOVE) ×2 IMPLANT
GLOVE BIOGEL PI IND STRL 6.5 (GLOVE) ×2 IMPLANT
GLOVE BIOGEL PI IND STRL 7.0 (GLOVE) ×2 IMPLANT
GOWN STRL REUS W/TWL LRG LVL3 (GOWN DISPOSABLE) ×4 IMPLANT
GRASPER SUT TROCAR 14GX15 (MISCELLANEOUS) IMPLANT
IRRIGATOR SUCT 8 DISP DVNC XI (IRRIGATION / IRRIGATOR) IMPLANT
IRRIGATOR SUCTION 8MM XI DISP (IRRIGATION / IRRIGATOR) ×1
IV NS IRRIG 3000ML ARTHROMATIC (IV SOLUTION) ×1 IMPLANT
KIT TURNOVER KIT A (KITS) ×1 IMPLANT
MANIFOLD NEPTUNE II (INSTRUMENTS) IMPLANT
NDL HYPO 21X1.5 SAFETY (NEEDLE) ×1 IMPLANT
NDL INSUFFLATION 14GA 120MM (NEEDLE) ×1 IMPLANT
NEEDLE HYPO 21X1.5 SAFETY (NEEDLE) ×1 IMPLANT
NEEDLE INSUFFLATION 14GA 120MM (NEEDLE) ×1 IMPLANT
OBTURATOR OPTICAL STANDARD 8MM (TROCAR) ×1
OBTURATOR OPTICAL STND 8 DVNC (TROCAR) ×1
OBTURATOR OPTICALSTD 8 DVNC (TROCAR) ×1 IMPLANT
PACK LAP CHOLE LZT030E (CUSTOM PROCEDURE TRAY) ×1 IMPLANT
PAD ARMBOARD 7.5X6 YLW CONV (MISCELLANEOUS) ×1 IMPLANT
PENCIL HANDSWITCHING (ELECTRODE) ×1 IMPLANT
SEAL CANN UNIV 5-8 DVNC XI (MISCELLANEOUS) ×4 IMPLANT
SEAL XI 5MM-8MM UNIVERSAL (MISCELLANEOUS) ×4
SET BASIN LINEN APH (SET/KITS/TRAYS/PACK) ×1 IMPLANT
SET TUBE SMOKE EVAC HIGH FLOW (TUBING) ×1 IMPLANT
STAPLER VISISTAT (STAPLE) IMPLANT
SUT MNCRL AB 4-0 PS2 18 (SUTURE) ×2 IMPLANT
SUT VIC AB 3-0 SH 27 (SUTURE) ×1
SUT VIC AB 3-0 SH 27X BRD (SUTURE) IMPLANT
SUT VICRYL 0 AB UR-6 (SUTURE) IMPLANT
SYR 20ML LL LF (SYRINGE) ×1 IMPLANT
SYS RETRIEVAL 5MM INZII UNIV (BASKET) ×1
SYSTEM RETRIEVL 5MM INZII UNIV (BASKET) ×1 IMPLANT
WATER STERILE IRR 500ML POUR (IV SOLUTION) ×1 IMPLANT

## 2022-12-12 NOTE — Op Note (Addendum)
[facility] Operative Note  12/12/22  Preoperative Diagnosis: Symptomatic Cholelithiasis   Postoperative Diagnosis: Acute cholecystitis    Procedure(s) Performed: Robotic Assisted Laparoscopic Cholecystectomy   Surgeon: Ria Comment C. Constance Haw, MD   Assistants: No qualified resident was available    Anesthesia: General endotracheal   Anesthesiologist: Denese Killings, MD    Specimens: Gallbladder   Estimated Blood Loss: Minimal   Blood Replacement: None    Complications: None   Wound Class: Dirty Infected   Operative Indications: The patient was found to have stones and possible cholecystitis on imaging and was symptomatic.  We discussed the risk of the procedure including but not limited to bleeding, infection, injury to the common bile duct, bile leak, need for further procedures, chance of subtotal cholecystectomy.   Findings:  Distended inflamed gallbladder with purulence  Critical view of safety noted All clips intact at the end of the case Adequate hemostasis   Procedure: Firefly was given in the preoperative area. The patient was taken to the operating room and placed supine. General endotracheal anesthesia was induced. Intravenous antibiotics were  administered per protocol.  An orogastric tube positioned to decompress the stomach. The abdomen was prepared and draped in the usual sterile fashion.   Veress needle was placed at the supraumbilical area and insufflation was started after confirming a positive saline drop test and no immediate increase in abdominal pressure.  After reaching 15 mm, the Veress needle was removed and a 8 mm port was placed via optiview technique supraumbilical, measuring 20 mm away from the suspected position of the gallbladder.  The abdomen was inspected and no abnormalities or injuries were found.  Under direct vision, ports were placed in the following locations in a semi curvilinear position around the target of the  gallbladder: Two 8 mm ports on the patient's right each having 8cm clearance to the adjacent ports and one 8 mm port placed on the patient's left 8 cm from the umbilical port. Once ports were placed, the table was placed in the reverse Trendelenburg position with the right side up. The Xi platform was brought into the operative field and docked to the ports successfully.  An endoscope was placed through the umbilical port, fenestrated grasper through the most lateral right port, prograsp to the port just right of the umbilicus, and then a hook cautery in the left port.   The dome of the gallbladder was grasped with prograsp and retracted over the dome of the liver. Adhesions between the gallbladder and omentum, duodenum and transverse colon were lysed via hook cautery. The adhesions were significant with some rind peeling off the gallbladder. There was one area on the distal stomach that I worried about a possible superficial serosal injury and made a note to look at again. The infundibulum was grasped with the fenestrated grasper and retracted toward the right lower quadrant. This maneuver exposed Calot's triangle. Firefly was used throughout the dissection to ensure safe visualization of the cystic duct. The peritoneum overlying the gallbladder infundibulum was then dissected and the cystic duct and cystic artery identified.  Critical view of safety with the liver bed clearly visible behind the duct and artery with no additional structures noted.  The cystic duct and cystic artery were doubly clipped and divided close to the gallbladder.    The gallbladder was then dissected from its peritoneal and liver bed attachments by electrocautery. There was spillage of stones and purulent bile. The suction irrigator was used to suction all of this out.  A 4m Endo Catch bag was then placed through the left side port and the gallbladder was place in the port and great care was taken to place the stones that spilled  into the bag.  I then irrigated and found no further stones or major debride. The bag was closed.  Hemostasis was checked prior to removing the hook cautery.  The stomach where the superficial serosal injury was noted was inspected and this was very minimal. The camera was moved to the 2nd arm and a needle driver was placed through the 3rd arm. To be safe I used a 2-0 Vicryl and tacked omentum to the area to cover the possible site of the superficial injury. The needle and thread were removed.   The XMechele Claudewas undocked and moved out of the field.   The gallbladder and stones in the bag were passed off the table as a specimen. There was no evidence of bleeding from the gallbladder fossa or cystic artery or leakage of the bile from the cystic duct stump. The left side port site was closed with PMI and 0 Vicryl. The abdomen was desufflated and secondary trocars were removed under direct vision.  No bleeding was noted. All skin incisions were closed with staples and bandages were placed given the contamination.   Final inspection revealed acceptable hemostasis. All counts were correct at the end of the case. The patient was awakened from anesthesia and extubated without complication. The OG tube was removed.  The patient went to the PACU in stable condition.   LCurlene Labrum MD [facility] [place] [address] [place] [phone](office)

## 2022-12-12 NOTE — Progress Notes (Addendum)
[facility]  Updated her family. Will plan for post op visit 3/12 to remove staples. Acute cholecystitis, and will go home on antibiotics for 5 days, allergic to PCN so will do Cipro Flagyl for 5 days each.  Return to work 3/18 unless wants to return before. 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    Curlene Labrum, MD [facility] [address] [place], [place] [address] [phone] (office)

## 2022-12-12 NOTE — Anesthesia Procedure Notes (Signed)
Procedure Name: Intubation Date/Time: 12/12/2022 10:47 AM  Performed by: Orlie Dakin, CRNAPre-anesthesia Checklist: Patient identified, Emergency Drugs available, Suction available and Patient being monitored Patient Re-evaluated:Patient Re-evaluated prior to induction Oxygen Delivery Method: Circle system utilized Preoxygenation: Pre-oxygenation with 100% oxygen Induction Type: IV induction Ventilation: Mask ventilation without difficulty Laryngoscope Size: Mac and 4 Grade View: Grade II Tube type: Oral Tube size: 7.5 mm Number of attempts: 1 Airway Equipment and Method: Stylet Placement Confirmation: ETT inserted through vocal cords under direct vision, positive ETCO2 and breath sounds checked- equal and bilateral Secured at: 22 cm Tube secured with: Tape Dental Injury: Teeth and Oropharynx as per pre-operative assessment

## 2022-12-12 NOTE — Interval H&P Note (Signed)
History and Physical Interval Note:  12/12/2022 9:40 AM  [name]  has presented today for surgery, with the diagnosis of CHOLELITHIASIS.  The various methods of treatment have been discussed with the patient and family. After consideration of risks, benefits and other options for treatment, the patient has consented to  Procedure(s): XI ROBOTIC [place] (N/A) as a surgical intervention.  The patient's history has been reviewed, patient examined, no change in status, stable for surgery.  I have reviewed the patient's chart and labs.  Questions were answered to the patient's satisfaction.    No changes.  Virl Cagey

## 2022-12-12 NOTE — Discharge Instructions (Signed)
Discharge Robotic Assisted Laparoscopic Surgery Instructions:  Take the antibiotic as prescribed. Your gallbladder had some infection it in.   Common Complaints: Right shoulder pain is common after laparoscopic surgery.  This is secondary to the gas used in the surgery being trapped under the diaphragm.  Walk to help your body absorb the gas. This will improve in a few days. Pain at the port sites are common, especially the larger port sites. This will improve with time.  Some nausea is common and poor appetite. The main goal is to stay hydrated the first few days after surgery.   Diet/ Activity: Diet as tolerated. You may not have an appetite, but it is important to stay hydrated.  Drink 64 ounces of water a day. Your appetite will return with time.  Shower per your regular routine daily.  Do not take hot showers. Take warm showers that are less than 10 minutes. Rest and listen to your body, but do not remain in bed all day.  Walk everyday for at least 15-20 minutes. Deep cough and move around every 1-2 hours in the first few days after surgery.  Do not lift > 10 lbs, perform excessive bending, pushing, pulling, squatting for 1-2 weeks after surgery.  Do not pick at the dermabond glue on your incision sites.  This glue film will remain in place for 1-2 weeks and will start to peel off.  Do not place lotions or balms on your incision unless instructed to specifically by Dr. Constance Haw.   Pain Expectations and Narcotics: -After surgery you will have pain associated with your incisions and this is normal. The pain is muscular and nerve pain, and will get better with time. -You are encouraged and expected to take non narcotic medications like tylenol and ibuprofen (when able) to treat pain as multiple modalities can aid with pain treatment. -Narcotics are only used when pain is severe or there is breakthrough pain. -You are not expected to have a pain score of 0 after surgery, as we cannot prevent  pain. A pain score of 3-4 that allows you to be functional, move, walk, and tolerate some activity is the goal. The pain will continue to improve over the days after surgery and is dependent on your surgery. -Due to [place] law, we are only able to give a certain amount of pain medication to treat post operative pain, and we only give additional narcotics on a patient by patient basis.  -For most laparoscopic surgery, studies have shown that the majority of patients only need 10-15 narcotic pills, and for open surgeries most patients only need 15-20.   -Having appropriate expectations of pain and knowledge of pain management with non narcotics is important as we do not want anyone to become addicted to narcotic pain medication.  -Using ice packs in the first 48 hours and heating pads after 48 hours, wearing an abdominal binder (when recommended), and using over the counter medications are all ways to help with pain management.   -Simple acts like meditation and mindfulness practices after surgery can also help with pain control and research has proven the benefit of these practices.  Medication: Take tylenol and ibuprofen as needed for pain control, alternating every 4-6 hours.  Example:  Tylenol '1000mg'$  @ 6am, 12noon, 6pm, 5mdnight (Do not exceed '4000mg'$  of tylenol a day). Ibuprofen '800mg'$  @ 9am, 3pm, 9pm, 3am (Do not exceed '3600mg'$  of ibuprofen a day).  Take Roxicodone for breakthrough pain every 4 hours.  Take Colace for constipation  related to narcotic pain medication. If you do not have a bowel movement in 2 days, take Miralax over the counter.  Drink plenty of water to also prevent constipation.   Contact Information: If you have questions or concerns, please call our office, [phone], Monday- Thursday 8AM-5PM and Friday 8AM-12Noon.  If it is after hours or on the weekend, please call [facility] Main Number, [phone], [phone], and ask to speak to the surgeon on call for Dr. Constance Haw at [facility].

## 2022-12-12 NOTE — Anesthesia Postprocedure Evaluation (Signed)
Anesthesia Post Note  Patient: [name]  Procedure(s) Performed: XI ROBOTIC ASSISTED LAPAROSCOPIC CHOLECYSTECTOMY (Abdomen)  Patient location during evaluation: Phase II Anesthesia Type: General Level of consciousness: awake and alert and oriented Pain management: pain level controlled Vital Signs Assessment: post-procedure vital signs reviewed and stable Respiratory status: spontaneous breathing, nonlabored ventilation and respiratory function stable Cardiovascular status: blood pressure returned to baseline and stable Postop Assessment: no apparent nausea or vomiting Anesthetic complications: no  No notable events documented.   Last Vitals:  Vitals:   12/12/22 1430 12/12/22 1451  BP: 109/79 118/88  Pulse: 77 81  Resp: 14 17  Temp:  36.7 C  SpO2: 97% 98%    Last Pain:  Vitals:   12/12/22 1451  TempSrc: Oral  PainSc: 4                  Maelie Chriswell C Marney Treloar

## 2022-12-12 NOTE — Anesthesia Preprocedure Evaluation (Signed)
Anesthesia Evaluation  Patient identified by MRN, date of birth, ID band Patient awake    Reviewed: Allergy & Precautions, H&P , NPO status , Patient's Chart, lab work & pertinent test results  Airway Mallampati: III  TM Distance: >3 FB Neck ROM: Full    Dental  (+) Dental Advisory Given, Chipped   Pulmonary asthma    Pulmonary exam normal breath sounds clear to auscultation       Cardiovascular Exercise Tolerance: Good hypertension, Pt. on medications Normal cardiovascular exam Rhythm:Regular Rate:Normal     Neuro/Psych  Neuromuscular disease  negative psych ROS   GI/Hepatic Neg liver ROS, PUD,,,  Endo/Other  Hypothyroidism    Renal/GU negative Renal ROS  negative genitourinary   Musculoskeletal  (+) Arthritis , Osteoarthritis,  Left sciatica   Abdominal   Peds negative pediatric ROS (+)  Hematology negative hematology ROS (+)   Anesthesia Other Findings Left sciatica  Reproductive/Obstetrics negative OB ROS                             Anesthesia Physical Anesthesia Plan  ASA: 2  Anesthesia Plan: General   Post-op Pain Management: Dilaudid IV   Induction: Intravenous  PONV Risk Score and Plan: 4 or greater and Ondansetron, Dexamethasone and Midazolam  Airway Management Planned: Oral ETT  Additional Equipment:   Intra-op Plan:   Post-operative Plan: Extubation in OR  Informed Consent: I have reviewed the patients History and Physical, chart, labs and discussed the procedure including the risks, benefits and alternatives for the proposed anesthesia with the patient or authorized representative who has indicated his/her understanding and acceptance.     Dental advisory given  Plan Discussed with: CRNA and Surgeon  Anesthesia Plan Comments:         Anesthesia Quick Evaluation

## 2022-12-12 NOTE — Transfer of Care (Signed)
Immediate Anesthesia Transfer of Care Note  Patient: [name]  Procedure(s) Performed: XI ROBOTIC ASSISTED LAPAROSCOPIC CHOLECYSTECTOMY (Abdomen)  Patient Location: PACU  Anesthesia Type:General  Level of Consciousness: awake, alert , oriented, and patient cooperative  Airway & Oxygen Therapy: Patient Spontanous Breathing  Post-op Assessment: Report given to RN, Post -op Vital signs reviewed and stable, and Patient moving all extremities  Post vital signs: Reviewed  Last Vitals:  Vitals Value Taken Time  BP 108/70 12/12/22 1330  Temp    Pulse 77 12/12/22 1335  Resp 14 12/12/22 1335  SpO2 100 % 12/12/22 1335  Vitals shown include unvalidated device data.  Last Pain:  Vitals:   12/12/22 0959  TempSrc: Oral  PainSc: 0-No pain         Complications: No notable events documented.

## 2022-12-15 ENCOUNTER — Telehealth: Payer: Self-pay | Admitting: Family Medicine

## 2022-12-15 LAB — SURGICAL PATHOLOGY

## 2022-12-15 NOTE — Telephone Encounter (Signed)
FMLA paperwork completed and faxed to [employer] at [phone]. Received confirmation.    Patient out of work starting 12/12/2022 and may return on 12/29/2022 unrestricted.

## 2022-12-17 ENCOUNTER — Telehealth: Payer: Self-pay | Admitting: *Deleted

## 2022-12-17 MED ORDER — HYDROXYZINE HCL 10 MG PO TABS: 10.0000 mg | ORAL_TABLET | Freq: Three times a day (TID) | ORAL | 0 refills | Status: DC | PRN

## 2022-12-17 NOTE — Telephone Encounter (Signed)
Surgical Date: 12/12/2022 Procedure: XI Lap Chole  Received call from patient [phone]~ telephone.  Patient reports that she has noted increased itching and hives to her back, buttocks and torso. Intense itching noted to fingertips.   Patient states that she noted hives and itching on Sunday, 12/14/2022.  Patient reports that she is taking ABTx. States that she began Cipro/ Flagyl on 12/12/2022 once she returned home from hospital. Reports that she has x2 tabs of Flagyl left.   Advised to stop Flagyl. Advised to use Benadryl for possible reaction. Advised that she has been exposed to multiple new things in surgery and during post op. Unable to determine cause of hives.   Patient states that she has used Hydroxyzine for itching in the past with her Eczema and inquired if she could get a prescription.   Please advise.

## 2022-12-17 NOTE — Telephone Encounter (Signed)
Agree with plan, sent in Hydroxyzine

## 2022-12-17 NOTE — Telephone Encounter (Signed)
Call placed to patient and patient made aware.   Precautions explained to patient. Advised if hives start forming on face and neck, or if she notes SOB, throat itching, etc go to ER.

## 2022-12-19 ENCOUNTER — Encounter: Payer: Self-pay | Admitting: Family Medicine

## 2022-12-19 ENCOUNTER — Ambulatory Visit: Payer: BC Managed Care – PPO | Admitting: Family Medicine

## 2022-12-19 VITALS — BP 137/88 | HR 93 | Ht 67.0 in | Wt 216.0 lb

## 2022-12-19 DIAGNOSIS — I10 Essential (primary) hypertension: Secondary | ICD-10-CM | POA: Diagnosis not present

## 2022-12-19 DIAGNOSIS — L239 Allergic contact dermatitis, unspecified cause: Secondary | ICD-10-CM | POA: Diagnosis not present

## 2022-12-19 DIAGNOSIS — E038 Other specified hypothyroidism: Secondary | ICD-10-CM

## 2022-12-19 MED ORDER — PREDNISONE 20 MG PO TABS: 20.0000 mg | ORAL_TABLET | Freq: Two times a day (BID) | ORAL | 0 refills | Status: DC

## 2022-12-19 NOTE — Progress Notes (Signed)
follo

## 2022-12-19 NOTE — Patient Instructions (Addendum)
F/U in 5 months, call if you need me sooner  1 week course of prednisone is prescribed for allergic reaction  OK to increase evening dose hydroxyzine to TWO 10 mgh tabs if needed  Note is made of allergy to cipro and flagyll , currently uncertain which  Thanks for choosing [facility] we consider it a privelige to serve you.

## 2022-12-23 ENCOUNTER — Encounter: Payer: Self-pay | Admitting: *Deleted

## 2022-12-23 ENCOUNTER — Ambulatory Visit (INDEPENDENT_AMBULATORY_CARE_PROVIDER_SITE_OTHER): Payer: BC Managed Care – PPO | Admitting: General Surgery

## 2022-12-23 ENCOUNTER — Encounter: Payer: Self-pay | Admitting: General Surgery

## 2022-12-23 VITALS — BP 141/87 | HR 67 | Temp 98.1°F | Resp 16 | Ht 67.0 in | Wt 213.0 lb

## 2022-12-23 DIAGNOSIS — K8020 Calculus of gallbladder without cholecystitis without obstruction: Secondary | ICD-10-CM

## 2022-12-23 NOTE — Patient Instructions (Addendum)
Steristrips will peel off in the next 5-7 days. You can remove them once they are peeling off. It is ok to shower. Pat the area dry. 4 Activity and diet as tolerated.   Ok to return to work Monday 12/29/22

## 2022-12-23 NOTE — Progress Notes (Signed)
[facility]  Pathology: FINAL MICROSCOPIC DIAGNOSIS:   A. GALLBLADDER, CHOLECYSTECTOMY:  Chronic and subacute cholecystitis with focal mucosal erosion  Cholelithiasis

## 2022-12-28 ENCOUNTER — Other Ambulatory Visit: Payer: Self-pay | Admitting: Family Medicine

## 2022-12-29 DIAGNOSIS — L239 Allergic contact dermatitis, unspecified cause: Secondary | ICD-10-CM | POA: Insufficient documentation

## 2022-12-29 NOTE — Progress Notes (Signed)
   [name]     MRN: [id number]      DOB: 23-May-1966   HPI [name] is here c/o allergic reaction to antibiotic , cipro OR flagyll, with severe dermatitis which she has had in her past work environment. Denies shortness of breath or cough or difficulty speaking Antinbiotics prescribed following cholecystectomy with infected gall bladder  ROS Denies recent fever or chills. Denies sinus pressure, nasal congestion, ear pain or sore throat. Denies chest congestion, productive cough or wheezing. Denies chest pains, palpitations and leg swelling Denies abdominal pain, nausea, vomiting,diarrhea or constipation.   Denies dysuria, frequency, hesitancy or incontinence. Denies joint pain, swelling and limitation in mobility. Denies headaches, seizures, numbness, or tingling. Denies depression, anxiety or insomnia.   PE  BP 137/88   Pulse 93   Ht 5\' 7"  (1.702 m)   Wt 216 lb 0.6 oz (98 kg)   LMP 12/03/2022 (Exact Date)   SpO2 96%   BMI 33.84 kg/m   Patient alert and oriented and in no cardiopulmonary distress.  HEENT: No facial asymmetry, EOMI,     Neck supple .  Chest: Clear to auscultation bilaterally.  CVS: S1, S2 no murmurs, no S3.Regular rate.  ABD: Soft non tender.   Ext: No edema  MS: Adequate ROM spine, shoulders, hips and knees.  Skin: Intact, erythematous maculopapular rash on trunk Psych: Good eye contact, normal affect. Memory intact not anxious or depressed appearing.  CNS: CN 2-12 intact, power,  normal throughout.no focal deficits noted.   Assessment & Plan  Allergic dermatitis Prednisone short term and increase atarax at bedtime if needed, latter was prescribed by surgery. No respiratory compromoisweon exam Uncertain which med , so both recorded , cipro and flagyll  Hypothyroidism Controlled, no change in medication   Essential hypertension Controlled, no change in medication

## 2022-12-29 NOTE — Assessment & Plan Note (Signed)
Controlled, no change in medication  

## 2022-12-29 NOTE — Assessment & Plan Note (Signed)
Prednisone short term and increase atarax at bedtime if needed, latter was prescribed by surgery. No respiratory compromoisweon exam Uncertain which med , so both recorded , cipro and flagyll

## 2023-01-16 ENCOUNTER — Other Ambulatory Visit: Payer: Self-pay | Admitting: Family Medicine

## 2023-01-18 ENCOUNTER — Other Ambulatory Visit: Payer: Self-pay | Admitting: Family Medicine

## 2023-03-20 ENCOUNTER — Telehealth: Payer: Self-pay | Admitting: Family Medicine

## 2023-03-20 NOTE — Telephone Encounter (Signed)
Patient called need asthma medicine refill, completely out of this medicine.    Trelegy 200 mg - 6.25 mcg / 25   Pharmacy  [id number] [place], [place] - [address] AT [facility] [address], [place] [address] Phone: [phone]  Fax: [phone] DEA #: [id number]

## 2023-03-20 NOTE — Telephone Encounter (Signed)
[facility]

## 2023-03-23 ENCOUNTER — Encounter: Payer: Self-pay | Admitting: Family Medicine

## 2023-03-23 NOTE — Telephone Encounter (Signed)
Patient called said [facility] last Friday but needs authorization  Patient asking for Symbicort and it is cheaper.patient does not like Trelogy.   Please contact patient back and let her know what is going on she is completely out of medicine  [facility]

## 2023-03-24 ENCOUNTER — Other Ambulatory Visit: Payer: Self-pay

## 2023-03-24 MED ORDER — BUDESONIDE-FORMOTEROL FUMARATE 80-4.5 MCG/ACT IN AERO: 2.0000 | INHALATION_SPRAY | Freq: Two times a day (BID) | RESPIRATORY_TRACT | 3 refills | Status: DC

## 2023-03-24 NOTE — Telephone Encounter (Signed)
Asking for symbicort- doesn't like trelegy

## 2023-03-24 NOTE — Telephone Encounter (Signed)
Form faxed to pharmacy as well

## 2023-03-26 ENCOUNTER — Other Ambulatory Visit: Payer: Self-pay | Admitting: Family Medicine

## 2023-03-26 DIAGNOSIS — E038 Other specified hypothyroidism: Secondary | ICD-10-CM

## 2023-03-27 NOTE — Telephone Encounter (Signed)
Patient aware.

## 2023-05-05 ENCOUNTER — Other Ambulatory Visit: Payer: Self-pay | Admitting: Family Medicine

## 2023-05-11 ENCOUNTER — Other Ambulatory Visit: Payer: Self-pay

## 2023-05-11 DIAGNOSIS — E038 Other specified hypothyroidism: Secondary | ICD-10-CM

## 2023-05-11 MED ORDER — AMLODIPINE BESYLATE 10 MG PO TABS: 10.0000 mg | ORAL_TABLET | Freq: Every day | ORAL | 2 refills | Status: DC

## 2023-05-11 MED ORDER — LEVOTHYROXINE SODIUM 100 MCG PO TABS: 100.0000 ug | ORAL_TABLET | Freq: Every day | ORAL | 2 refills | Status: DC

## 2023-05-11 MED ORDER — ROSUVASTATIN CALCIUM 5 MG PO TABS: 5.0000 mg | ORAL_TABLET | Freq: Every day | ORAL | 2 refills | Status: DC

## 2023-05-22 ENCOUNTER — Ambulatory Visit: Payer: BC Managed Care – PPO | Admitting: Family Medicine

## 2023-06-29 ENCOUNTER — Telehealth: Payer: Self-pay | Admitting: Family Medicine

## 2023-06-29 NOTE — Telephone Encounter (Signed)
FMLA forms  Copied Noted Sleeved  Call patient when ready. Needs ASAP by Friday, 09.20.2024, forms were given to her by her job late and needs back by 09.20.2024 to continue her FMLA

## 2023-07-08 ENCOUNTER — Telehealth: Payer: Self-pay | Admitting: Family Medicine

## 2023-07-08 NOTE — Telephone Encounter (Signed)
Prescription Request  07/08/2023  LOV: 12/19/2022  What is the name of the medication or equipment? Muscle relaxer does not know name of it.  Have you contacted your pharmacy to request a refill? No   [facility]     Patient notified that their request is being sent to the clinical staff for review and that they should receive a response within 2 business days.   Please advise at Mobile [phone] (mobile)   [facility] on muscle relaxwer, [facility]

## 2023-07-10 ENCOUNTER — Ambulatory Visit (INDEPENDENT_AMBULATORY_CARE_PROVIDER_SITE_OTHER): Payer: BC Managed Care – PPO | Admitting: Family Medicine

## 2023-07-10 ENCOUNTER — Encounter: Payer: Self-pay | Admitting: Family Medicine

## 2023-07-10 VITALS — BP 149/93 | HR 71 | Ht 67.0 in | Wt 234.0 lb

## 2023-07-10 DIAGNOSIS — M5441 Lumbago with sciatica, right side: Secondary | ICD-10-CM | POA: Diagnosis not present

## 2023-07-10 MED ORDER — KETOROLAC TROMETHAMINE 60 MG/2ML IM SOLN: 60.0000 mg | Freq: Once | INTRAMUSCULAR | Status: DC

## 2023-07-10 MED ORDER — METHOCARBAMOL 750 MG PO TABS: 750.0000 mg | ORAL_TABLET | Freq: Three times a day (TID) | ORAL | 1 refills | Status: DC | PRN

## 2023-07-10 MED ORDER — KETOROLAC TROMETHAMINE 60 MG/2ML IM SOLN
60.0000 mg | Freq: Once | INTRAMUSCULAR | Status: AC
  Administered 2023-07-10: 60 mg via INTRAMUSCULAR

## 2023-07-10 NOTE — Assessment & Plan Note (Signed)
Lumbar xray ordered awaiting results will follow up. Toradol injection given today  Robaxin 750 mg PRN  Advise non pharmacological interventions include rest, avoid twisting, improper bending, straining lower back. Demonstration of proper body mechanics. Alternate ice and heat. Recommend stretching back and legs. Follow up for worsening or persistent symptoms. Patient verbalizes understanding regarding plan of care and all questions answered.

## 2023-07-10 NOTE — Progress Notes (Signed)
Patient Office Visit   Subjective   Patient ID: [name], female    DOB: [birth year]  Age: 57 y.o. MRN: [id number]  CC:  Chief Complaint  Patient presents with   Hip Pain   Back Pain    Right side, off and on, increasing over past week. Previous back surgery and patient reports pain feels same as pre surgery    HPI [name] 57 year old female, presents to the clinic for acute back pain radiating to right hip.She  has a past medical history of Asthma (2010), Eczema (since childhoosd), Fibroids, Hypertension, and Menorrhagia.  Back Pain This is a recurrent problem. The problem occurs constantly. The problem has been gradually worsening since onset. The pain is present in the lumbar spine. The quality of the pain is described as shooting, burning, aching, stabbing and cramping. The pain radiates to the right thigh (right hip). The pain is at a severity of 10/10. The pain is Worse during the night. The symptoms are aggravated by position, bending, twisting and standing. Stiffness is present All day. Associated symptoms include leg pain, numbness and tingling. Pertinent negatives include no bladder incontinence, bowel incontinence, chest pain or fever. Risk factors include recent trauma, obesity and lack of exercise. She has tried NSAIDs for the symptoms. The treatment provided no relief.      Outpatient Encounter Medications as of 07/10/2023  Medication Sig   albuterol (VENTOLIN HFA) 108 (90 Base) MCG/ACT inhaler TAKE 2 PUFFS BY MOUTH EVERY 6 HOURS AS NEEDED FOR WHEEZE OR SHORTNESS OF BREATH   amLODipine (NORVASC) 10 MG tablet Take 1 tablet (10 mg total) by mouth daily.   budesonide-formoterol (SYMBICORT) 80-4.5 MCG/ACT inhaler Inhale 2 puffs into the lungs 2 (two) times daily.   hydrOXYzine (ATARAX) 10 MG tablet Take 1 tablet (10 mg total) by mouth 3 (three) times daily as needed for itching.   levothyroxine (SYNTHROID) 100 MCG tablet Take 1 tablet (100 mcg total) by mouth  daily.   methocarbamol (ROBAXIN-750) 750 MG tablet Take 1 tablet (750 mg total) by mouth every 8 (eight) hours as needed for muscle spasms.   montelukast (SINGULAIR) 10 MG tablet Take 1 tablet (10 mg total) by mouth daily.   Vitamin D, Ergocalciferol, (DRISDOL) 1.25 MG (50000 UNIT) CAPS capsule TAKE 1 CAPSULE (50,000 UNITS TOTAL) BY MOUTH ONCE A WEEK. ONE CAPSULE ONCE WEEKLY   [DISCONTINUED] predniSONE (DELTASONE) 20 MG tablet Take 1 tablet (20 mg total) by mouth 2 (two) times daily with a meal.   [DISCONTINUED] rosuvastatin (CRESTOR) 5 MG tablet Take 1 tablet (5 mg total) by mouth daily.   [EXPIRED] ketorolac (TORADOL) injection 60 mg    [DISCONTINUED] ketorolac (TORADOL) injection 60 mg    No facility-administered encounter medications on file as of 07/10/2023.    Past Surgical History:  Procedure Laterality Date   ANKLE SURGERY Right    BACK SURGERY     COLONOSCOPY N/A 04/27/2020   Procedure: COLONOSCOPY;  Surgeon: Corbin Ade, MD;  Location: [facility];  Service: Endoscopy;  Laterality: N/A;  7:30   KNEE SURGERY Left    TUBAL LIGATION      Review of Systems  Constitutional:  Negative for chills and fever.  Respiratory:  Negative for shortness of breath.   Cardiovascular:  Negative for chest pain.  Gastrointestinal:  Negative for bowel incontinence.  Genitourinary:  Negative for bladder incontinence.  Musculoskeletal:  Positive for back pain, joint pain and myalgias.  Neurological:  Positive for  tingling and numbness.      Objective    BP (!) 149/93   Pulse 71   Ht 5\' 7"  (1.702 m)   Wt 234 lb (106.1 kg)   SpO2 93%   BMI 36.65 kg/m   Physical Exam Vitals reviewed.  Constitutional:      General: She is not in acute distress.    Appearance: Normal appearance. She is not ill-appearing, toxic-appearing or diaphoretic.  HENT:     Head: Normocephalic.  Eyes:     General:        Right eye: No discharge.        Left eye: No discharge.     Conjunctiva/sclera:  Conjunctivae normal.  Cardiovascular:     Rate and Rhythm: Normal rate.     Pulses: Normal pulses.     Heart sounds: Normal heart sounds.  Pulmonary:     Effort: Pulmonary effort is normal. No respiratory distress.     Breath sounds: Normal breath sounds.  Musculoskeletal:     Cervical back: Normal range of motion.     Lumbar back: Tenderness present. No swelling or edema. Decreased range of motion. Positive right straight leg raise test. Negative left straight leg raise test.  Skin:    General: Skin is warm and dry.     Capillary Refill: Capillary refill takes less than 2 seconds.  Neurological:     General: No focal deficit present.     Mental Status: She is alert.  Psychiatric:        Mood and Affect: Mood normal.       Assessment & Plan:  Acute bilateral low back pain with right-sided sciatica Assessment & Plan: Lumbar xray ordered awaiting results will follow up. Toradol injection given today  Robaxin 750 mg PRN  Advise non pharmacological interventions include rest, avoid twisting, improper bending, straining lower back. Demonstration of proper body mechanics. Alternate ice and heat. Recommend stretching back and legs. Follow up for worsening or persistent symptoms. Patient verbalizes understanding regarding plan of care and all questions answered.   Orders: -     Ketorolac Tromethamine -     Methocarbamol; Take 1 tablet (750 mg total) by mouth every 8 (eight) hours as needed for muscle spasms.  Dispense: 30 tablet; Refill: 1 -     DG Lumbar Spine 2-3 Views; Future    Return if symptoms worsen or fail to improve.   Cruzita Lederer Newman Nip, FNP

## 2023-07-14 ENCOUNTER — Ambulatory Visit (INDEPENDENT_AMBULATORY_CARE_PROVIDER_SITE_OTHER): Payer: BC Managed Care – PPO | Admitting: Family Medicine

## 2023-07-14 ENCOUNTER — Encounter: Payer: Self-pay | Admitting: Family Medicine

## 2023-07-14 ENCOUNTER — Ambulatory Visit (HOSPITAL_COMMUNITY)
Admission: RE | Admit: 2023-07-14 | Discharge: 2023-07-14 | Disposition: A | Discharge status: Home / Self Care | Payer: BC Managed Care – PPO | Source: Ambulatory Visit | Attending: Family Medicine | Admitting: Family Medicine

## 2023-07-14 VITALS — BP 148/92 | HR 79 | Ht 67.0 in | Wt 233.1 lb

## 2023-07-14 DIAGNOSIS — E038 Other specified hypothyroidism: Secondary | ICD-10-CM | POA: Diagnosis not present

## 2023-07-14 DIAGNOSIS — E785 Hyperlipidemia, unspecified: Secondary | ICD-10-CM

## 2023-07-14 DIAGNOSIS — M5431 Sciatica, right side: Secondary | ICD-10-CM | POA: Diagnosis not present

## 2023-07-14 DIAGNOSIS — Z1231 Encounter for screening mammogram for malignant neoplasm of breast: Secondary | ICD-10-CM

## 2023-07-14 DIAGNOSIS — M5441 Lumbago with sciatica, right side: Secondary | ICD-10-CM | POA: Insufficient documentation

## 2023-07-14 DIAGNOSIS — I10 Essential (primary) hypertension: Secondary | ICD-10-CM | POA: Diagnosis not present

## 2023-07-14 DIAGNOSIS — J4550 Severe persistent asthma, uncomplicated: Secondary | ICD-10-CM

## 2023-07-14 MED ORDER — OLMESARTAN MEDOXOMIL 20 MG PO TABS: 20.0000 mg | ORAL_TABLET | Freq: Every day | ORAL | 2 refills | Status: DC

## 2023-07-14 MED ORDER — PREDNISONE 20 MG PO TABS: 20.0000 mg | ORAL_TABLET | Freq: Two times a day (BID) | ORAL | 0 refills | Status: DC

## 2023-07-14 MED ORDER — METHYLPREDNISOLONE ACETATE 80 MG/ML IJ SUSP
80.0000 mg | Freq: Once | INTRAMUSCULAR | Status: AC
  Administered 2023-07-14: 80 mg via INTRAMUSCULAR

## 2023-07-14 NOTE — Patient Instructions (Addendum)
F/U as before, call if you need me sooner   Depo medrol 80 mg IM in office followed by a 1 week  course of prednisone  Please call next week if not getting better , I will order MRI   Blood pressure is high, olmesartan 20 mg one daily is added to your amlodipine 10 mg , take both tablets every day  Fasting lipid, cmp and EGFr and TSH 3 to 5 days before next visit  Please schedule mammogram at checkout  Work excuse from today to return 07/25/2023  Thanks for choosing [facility], we consider it a privelige to serve you.

## 2023-07-15 ENCOUNTER — Telehealth: Payer: Self-pay | Admitting: Family Medicine

## 2023-07-15 MED ORDER — OXYCODONE-ACETAMINOPHEN 5-325 MG PO TABS: ORAL_TABLET | ORAL | 0 refills | Status: DC

## 2023-07-15 NOTE — Addendum Note (Signed)
Addended by: Kerri Perches on: 07/15/2023 06:38 PM   Modules accepted: Orders

## 2023-07-15 NOTE — Telephone Encounter (Signed)
I spoke directly with the patient, she states that she still has significant pain on weightbearing.  I recommended she give herself an additional 2 days and call back if not significantly improved I will order an MRI.  I will review her prescription registry and I am willing to prescribe up to 2 tablets daily of medication Percocet as needed for uncontrolled pain as she states she has problems with sleep when she turns she is awakened with pain.

## 2023-07-15 NOTE — Telephone Encounter (Signed)
Patient need refill on oxyCODONE-acetaminophen (PERCOCET) 5-325 MG per tablet 1 tablet  Having a lot of hip pain just seen other provider meds prednisone that were prescribe not helping.  [facility]

## 2023-07-16 DIAGNOSIS — E785 Hyperlipidemia, unspecified: Secondary | ICD-10-CM | POA: Insufficient documentation

## 2023-07-16 NOTE — Assessment & Plan Note (Signed)
  Patient re-educated about  the importance of commitment to a  minimum of 150 minutes of exercise per week as able.  The importance of healthy food choices with portion control discussed, as well as eating regularly and within a 12 hour window most days. The need to choose "clean , green" food 50 to 75% of the time is discussed, as well as to make water the primary drink and set a goal of 64 ounces water daily.       07/14/2023    3:14 PM 07/10/2023    3:21 PM 12/23/2022   10:11 AM  Weight /BMI  Weight 233 lb 1.3 oz 234 lb 213 lb  Height 5\' 7"  (1.702 m) 5\' 7"  (1.702 m) 5\' 7"  (1.702 m)  BMI 36.51 kg/m2 36.65 kg/m2 33.36 kg/m2    Deteriorated

## 2023-07-16 NOTE — Progress Notes (Signed)
MARTIE MUHLBAUER     MRN: [id number]      DOB: 07/18/1966  Chief Complaint  Patient presents with   Follow-up    Sciatic pain in R side back spasms     HPI [name] is here for follow up right sciatic pain and muscle spasm. States no response to medication up until this time, in constant pain once she tries to put weight on the leg,  over a 10, unable to walk or stand without pain Denies any new weakness , numbness or incontinence ROS Denies recent fever or chills. Denies sinus pressure, nasal congestion, ear pain or sore throat. Denies chest congestion, productive cough or wheezing. Denies chest pains, palpitations and leg swelling Denies abdominal pain, nausea, vomiting,diarrhea or constipation.   Denies dysuria, frequency, hesitancy or incontinence. Increased stress and anxiey following the murder of her stepdaughter in May, and has been out of work for cholecystectomy Denies skin break down or rash.   PE  BP (!) 148/92   Pulse 79   Ht 5\' 7"  (1.702 m)   Wt 233 lb 1.3 oz (105.7 kg)   SpO2 92%   BMI 36.51 kg/m   Patient alert and oriented and in no cardiopulmonary distress.  HEENT: No facial asymmetry, EOMI,     Neck supple .  Chest: Clear to auscultation bilaterally.  CVS: S1, S2 no murmurs, no S3.Regular rate.  ABD: Soft non tender.   Ext: No edema  MS: decreased  ROM lumbar spine, adequate in shoulders, hips and knees.  Skin: Intact, no ulcerations or rash noted.  Psych: Good eye contact, normal affect. Memory intact not anxious or depressed appearing.  CNS: CN 2-12 intact, power,  normal throughout.no focal deficits noted.   Assessment & Plan Sciatica, right side Uncontrolled. depo medrol administered IM in the office , to be followed by a short course of oral prednisone if not improvedin 5 days needs MRI Work excuse .   Essential hypertension Uncontrolled , olmesartan 20 mg daily added DASH diet and commitment to daily physical activity for a  minimum of 30 minutes discussed and encouraged, as a part of hypertension management. The importance of attaining a healthy weight is also discussed.     07/14/2023    3:55 PM 07/14/2023    3:16 PM 07/14/2023    3:14 PM 07/10/2023    3:21 PM 12/23/2022   10:11 AM 12/19/2022   11:27 AM 12/12/2022    2:51 PM  BP/Weight  Systolic BP 148 147 147 149 141 137 118  Diastolic BP 92 90 89 93 87 88 88  Wt. (Lbs)   233.08 234 213 216.04   BMI   36.51 kg/m2 36.65 kg/m2 33.36 kg/m2 33.84 kg/m2        Morbid obesity (HCC)  Patient re-educated about  the importance of commitment to a  minimum of 150 minutes of exercise per week as able.  The importance of healthy food choices with portion control discussed, as well as eating regularly and within a 12 hour window most days. The need to choose "clean , green" food 50 to 75% of the time is discussed, as well as to make water the primary drink and set a goal of 64 ounces water daily.       07/14/2023    3:14 PM 07/10/2023    3:21 PM 12/23/2022   10:11 AM  Weight /BMI  Weight 233 lb 1.3 oz 234 lb 213 lb  Height 5\' 7"  (1.702 m)  5\' 7"  (1.702 m) 5\' 7"  (1.702 m)  BMI 36.51 kg/m2 36.65 kg/m2 33.36 kg/m2    Deteriorated  Hypothyroidism Updated lab needed at/ before next visit.   Hyperlipidemia LDL goal <100 Hyperlipidemia:Low fat diet discussed and encouraged.   Lipid Panel  Lab Results  Component Value Date   CHOL 209 (H) 04/08/2022   HDL 81 04/08/2022   LDLCALC 109 (H) 04/08/2022   TRIG 108 04/08/2022   CHOLHDL 2.6 04/08/2022     Updated lab needed at/ before next visit.   Severe persistent asthma Controlled, no change in medication

## 2023-07-16 NOTE — Assessment & Plan Note (Signed)
Uncontrolled , olmesartan 20 mg daily added DASH diet and commitment to daily physical activity for a minimum of 30 minutes discussed and encouraged, as a part of hypertension management. The importance of attaining a healthy weight is also discussed.     07/14/2023    3:55 PM 07/14/2023    3:16 PM 07/14/2023    3:14 PM 07/10/2023    3:21 PM 12/23/2022   10:11 AM 12/19/2022   11:27 AM 12/12/2022    2:51 PM  BP/Weight  Systolic BP 148 147 147 149 141 137 118  Diastolic BP 92 90 89 93 87 88 88  Wt. (Lbs)   233.08 234 213 216.04   BMI   36.51 kg/m2 36.65 kg/m2 33.36 kg/m2 33.84 kg/m2

## 2023-07-16 NOTE — Assessment & Plan Note (Signed)
Uncontrolled. depo medrol administered IM in the office , to be followed by a short course of oral prednisone if not improvedin 5 days needs MRI Work excuse .

## 2023-07-16 NOTE — Assessment & Plan Note (Signed)
Updated lab needed at/ before next visit.   

## 2023-07-16 NOTE — Addendum Note (Signed)
Addended by: Kerri Perches on: 07/16/2023 09:45 PM   Modules accepted: Orders

## 2023-07-16 NOTE — Assessment & Plan Note (Signed)
Hyperlipidemia:Low fat diet discussed and encouraged.   Lipid Panel  Lab Results  Component Value Date   CHOL 209 (H) 04/08/2022   HDL 81 04/08/2022   LDLCALC 109 (H) 04/08/2022   TRIG 108 04/08/2022   CHOLHDL 2.6 04/08/2022     Updated lab needed at/ before next visit.

## 2023-07-16 NOTE — Assessment & Plan Note (Signed)
Controlled, no change in medication  

## 2023-07-24 ENCOUNTER — Ambulatory Visit (INDEPENDENT_AMBULATORY_CARE_PROVIDER_SITE_OTHER): Payer: BC Managed Care – PPO | Admitting: Family Medicine

## 2023-07-24 ENCOUNTER — Encounter: Payer: Self-pay | Admitting: Family Medicine

## 2023-07-24 VITALS — BP 120/82 | HR 76 | Ht 67.0 in | Wt 233.1 lb

## 2023-07-24 DIAGNOSIS — E559 Vitamin D deficiency, unspecified: Secondary | ICD-10-CM

## 2023-07-24 DIAGNOSIS — E038 Other specified hypothyroidism: Secondary | ICD-10-CM

## 2023-07-24 DIAGNOSIS — E039 Hypothyroidism, unspecified: Secondary | ICD-10-CM

## 2023-07-24 DIAGNOSIS — G8929 Other chronic pain: Secondary | ICD-10-CM

## 2023-07-24 DIAGNOSIS — R7301 Impaired fasting glucose: Secondary | ICD-10-CM

## 2023-07-24 DIAGNOSIS — M5440 Lumbago with sciatica, unspecified side: Secondary | ICD-10-CM

## 2023-07-24 DIAGNOSIS — E0789 Other specified disorders of thyroid: Secondary | ICD-10-CM

## 2023-07-24 DIAGNOSIS — R7303 Prediabetes: Secondary | ICD-10-CM

## 2023-07-24 DIAGNOSIS — M5441 Lumbago with sciatica, right side: Secondary | ICD-10-CM

## 2023-07-24 DIAGNOSIS — I10 Essential (primary) hypertension: Secondary | ICD-10-CM | POA: Diagnosis not present

## 2023-07-24 DIAGNOSIS — E785 Hyperlipidemia, unspecified: Secondary | ICD-10-CM

## 2023-07-24 MED ORDER — METHOCARBAMOL 750 MG PO TABS: 750.0000 mg | ORAL_TABLET | Freq: Three times a day (TID) | ORAL | 1 refills | Status: DC | PRN

## 2023-07-24 MED ORDER — MELOXICAM 15 MG PO TABS: ORAL_TABLET | ORAL | 0 refills | Status: DC

## 2023-07-24 NOTE — Patient Instructions (Addendum)
Annual exam in 4 months, call if you need me before  Meds for acute pain are meloxicam and robaxin   return to work with no   restrictions 08/27/2023, out since 08/14/2023  Fating lipid, cmp and eGFr,tSH, Vit D, HBA1c next week please  Condolence re your loss  Thanks for choosing [facility], we consider it a privelige to serve you.

## 2023-07-27 ENCOUNTER — Inpatient Hospital Stay (HOSPITAL_COMMUNITY): Admission: RE | Admit: 2023-07-27 | Payer: BC Managed Care – PPO | Source: Ambulatory Visit

## 2023-07-27 DIAGNOSIS — R7301 Impaired fasting glucose: Secondary | ICD-10-CM | POA: Insufficient documentation

## 2023-07-27 NOTE — Assessment & Plan Note (Addendum)
Significant improvement with resolution of pain return to work with no rstrictions Robaxin and /or meloxicam to be used for flares

## 2023-07-27 NOTE — Assessment & Plan Note (Addendum)
Controlled, no change in medication DASH diet and commitment to daily physical activity for a minimum of 30 minutes discussed and encouraged, as a part of hypertension management. The importance of attaining a healthy weight is also discussed.     07/24/2023    4:07 PM 07/24/2023    3:13 PM 07/14/2023    3:55 PM 07/14/2023    3:16 PM 07/14/2023    3:14 PM 07/10/2023    3:21 PM 12/23/2022   10:11 AM  BP/Weight  Systolic BP 120 139 148 147 147 149 141  Diastolic BP 82 86 92 90 89 93 87  Wt. (Lbs)  233.12   233.08 234 213  BMI  36.51 kg/m2   36.51 kg/m2 36.65 kg/m2 33.36 kg/m2

## 2023-07-27 NOTE — Assessment & Plan Note (Signed)
Hyperlipidemia:Low fat diet discussed and encouraged.   Lipid Panel  Lab Results  Component Value Date   CHOL 209 (H) 04/08/2022   HDL 81 04/08/2022   LDLCALC 109 (H) 04/08/2022   TRIG 108 04/08/2022   CHOLHDL 2.6 04/08/2022     Updated lab needed

## 2023-07-27 NOTE — Assessment & Plan Note (Signed)
Updated lab needed at/ before next visit.

## 2023-07-27 NOTE — Progress Notes (Signed)
[name]     MRN: [id number]      DOB: 05-11-1966  Chief Complaint  Patient presents with   Care Management    5 month f/u, pt reports back feeling better states she is ready to go back on Monday.    HPI Ms. Demicco is here for follow up and re-evaluation of chronic medical conditions, medication management and review of any available recent lab and radiology data.  Preventive health is updated, specifically  Cancer screening and Immunization.      ROS Denies recent fever or chills. Denies sinus pressure, nasal congestion, ear pain or sore throat. Denies chest congestion, productive cough or wheezing. Denies chest pains, palpitations and leg swelling Denies abdominal pain, nausea, vomiting,diarrhea or constipation.   Denies dysuria, frequency, hesitancy or incontinence. Denies uncontrolled joint pain, swelling and limitation in mobility. Denies headaches, seizures, numbness, or tingling. Denies depression, anxiety or insomnia. Denies skin break down or rash.   PE  BP 120/82   Pulse 76   Ht 5\' 7"  (1.702 m)   Wt 233 lb 1.9 oz (105.7 kg)   SpO2 93%   BMI 36.51 kg/m   Patient alert and oriented and in no cardiopulmonary distress.  HEENT: No facial asymmetry, EOMI,     Neck supple .  Chest: Clear to auscultation bilaterally.  CVS: S1, S2 no murmurs, no S3.Regular rate.  ABD: Soft non tender.   Ext: No edema  MS: Adequate ROM spine, shoulders, hips and knees.  Skin: Intact, no ulcerations or rash noted.  Psych: Good eye contact, normal affect. Memory intact not anxious or depressed appearing.  CNS: CN 2-12 intact, power,  normal throughout.no focal deficits noted.   Assessment & Plan  Low back pain Significant improvement with resolution of pain return to work with no rstrictions Robaxin and /or meloxicam to be used for flares  Essential hypertension Controlled, no change in medication DASH diet and commitment to daily physical activity for a minimum  of 30 minutes discussed and encouraged, as a part of hypertension management. The importance of attaining a healthy weight is also discussed.     07/24/2023    4:07 PM 07/24/2023    3:13 PM 07/14/2023    3:55 PM 07/14/2023    3:16 PM 07/14/2023    3:14 PM 07/10/2023    3:21 PM 12/23/2022   10:11 AM  BP/Weight  Systolic BP 120 139 148 147 147 149 141  Diastolic BP 82 86 92 90 89 93 87  Wt. (Lbs)  233.12   233.08 234 213  BMI  36.51 kg/m2   36.51 kg/m2 36.65 kg/m2 33.36 kg/m2       Hypothyroidism Updated lab needed   Vitamin D deficiency Updated lab needed at/ before next visit.   Morbid obesity (HCC)  Patient re-educated about  the importance of commitment to a  minimum of 150 minutes of exercise per week as able.  The importance of healthy food choices with portion control discussed, as well as eating regularly and within a 12 hour window most days. The need to choose "clean , green" food 50 to 75% of the time is discussed, as well as to make water the primary drink and set a goal of 64 ounces water daily.       07/24/2023    3:13 PM 07/14/2023    3:14 PM 07/10/2023    3:21 PM  Weight /BMI  Weight 233 lb 1.9 oz 233 lb 1.3 oz 234 lb  Height 5\' 7"  (1.702 m) 5\' 7"  (1.702 m) 5\' 7"  (1.702 m)  BMI 36.51 kg/m2 36.51 kg/m2 36.65 kg/m2      Prediabetes Patient educated about the importance of limiting  Carbohydrate intake , the need to commit to daily physical activity for a minimum of 30 minutes , and to commit weight loss. The fact that changes in all these areas will reduce or eliminate all together the development of diabetes is stressed.      Latest Ref Rng & Units 12/04/2022   11:37 AM 07/01/2022    4:33 PM 04/08/2022    8:11 AM 04/23/2021    5:46 PM 05/11/2019   11:11 AM  Diabetic Labs  HbA1c 4.8 - 5.6 %   5.6   5.7   Chol 100 - 199 mg/dL   161   096   HDL >04 mg/dL   81   60   Calc LDL 0 - 99 mg/dL   540   981   Triglycerides 0 - 149 mg/dL   191   478    Creatinine 0.57 - 1.00 mg/dL 2.95  6.21  3.08  6.57  0.81       07/24/2023    4:07 PM 07/24/2023    3:13 PM 07/14/2023    3:55 PM 07/14/2023    3:16 PM 07/14/2023    3:14 PM 07/10/2023    3:21 PM 12/23/2022   10:11 AM  BP/Weight  Systolic BP 120 139 148 147 147 149 141  Diastolic BP 82 86 92 90 89 93 87  Wt. (Lbs)  233.12   233.08 234 213  BMI  36.51 kg/m2   36.51 kg/m2 36.65 kg/m2 33.36 kg/m2       No data to display          Updated lab needed at/ before next visit.   Hyperlipidemia LDL goal <100 Hyperlipidemia:Low fat diet discussed and encouraged.   Lipid Panel  Lab Results  Component Value Date   CHOL 209 (H) 04/08/2022   HDL 81 04/08/2022   LDLCALC 109 (H) 04/08/2022   TRIG 108 04/08/2022   CHOLHDL 2.6 04/08/2022     Updated lab needed

## 2023-07-27 NOTE — Assessment & Plan Note (Signed)
Patient educated about the importance of limiting  Carbohydrate intake , the need to commit to daily physical activity for a minimum of 30 minutes , and to commit weight loss. The fact that changes in all these areas will reduce or eliminate all together the development of diabetes is stressed.      Latest Ref Rng & Units 12/04/2022   11:37 AM 07/01/2022    4:33 PM 04/08/2022    8:11 AM 04/23/2021    5:46 PM 05/11/2019   11:11 AM  Diabetic Labs  HbA1c 4.8 - 5.6 %   5.6   5.7   Chol 100 - 199 mg/dL   161   096   HDL >04 mg/dL   81   60   Calc LDL 0 - 99 mg/dL   540   981   Triglycerides 0 - 149 mg/dL   191   478   Creatinine 0.57 - 1.00 mg/dL 2.95  6.21  3.08  6.57  0.81       07/24/2023    4:07 PM 07/24/2023    3:13 PM 07/14/2023    3:55 PM 07/14/2023    3:16 PM 07/14/2023    3:14 PM 07/10/2023    3:21 PM 12/23/2022   10:11 AM  BP/Weight  Systolic BP 120 139 148 147 147 149 141  Diastolic BP 82 86 92 90 89 93 87  Wt. (Lbs)  233.12   233.08 234 213  BMI  36.51 kg/m2   36.51 kg/m2 36.65 kg/m2 33.36 kg/m2       No data to display          Updated lab needed at/ before next visit.

## 2023-07-27 NOTE — Assessment & Plan Note (Signed)
Updated lab needed.

## 2023-07-27 NOTE — Assessment & Plan Note (Signed)
  Patient re-educated about  the importance of commitment to a  minimum of 150 minutes of exercise per week as able.  The importance of healthy food choices with portion control discussed, as well as eating regularly and within a 12 hour window most days. The need to choose "clean , green" food 50 to 75% of the time is discussed, as well as to make water the primary drink and set a goal of 64 ounces water daily.       07/24/2023    3:13 PM 07/14/2023    3:14 PM 07/10/2023    3:21 PM  Weight /BMI  Weight 233 lb 1.9 oz 233 lb 1.3 oz 234 lb  Height 5\' 7"  (1.702 m) 5\' 7"  (1.702 m) 5\' 7"  (1.702 m)  BMI 36.51 kg/m2 36.51 kg/m2 36.65 kg/m2

## 2023-07-30 ENCOUNTER — Encounter: Payer: Self-pay | Admitting: Family Medicine

## 2023-07-30 ENCOUNTER — Telehealth: Payer: Self-pay | Admitting: Family Medicine

## 2023-07-30 NOTE — Telephone Encounter (Signed)
[employer]  medical certification  Noted Copied Sleeved  Call patient when ready and fax to [employer] [phone] Claim # [id number]

## 2023-07-30 NOTE — Telephone Encounter (Signed)
STD   Noted Copied Sleeved

## 2023-08-06 ENCOUNTER — Other Ambulatory Visit: Payer: Self-pay | Admitting: Student in an Organized Health Care Education/Training Program

## 2023-08-06 DIAGNOSIS — M5416 Radiculopathy, lumbar region: Secondary | ICD-10-CM

## 2023-08-06 NOTE — Telephone Encounter (Signed)
FORMS FAXED  LVM FOR PATIENT PICK UP

## 2023-08-21 NOTE — Telephone Encounter (Signed)
Summary  Pt can't get through to pharmacy and is in pain and needs oxycodone refill too

## 2023-08-22 ENCOUNTER — Other Ambulatory Visit: Payer: Self-pay | Admitting: Family Medicine

## 2023-08-22 DIAGNOSIS — G8929 Other chronic pain: Secondary | ICD-10-CM

## 2023-08-24 ENCOUNTER — Other Ambulatory Visit: Payer: Self-pay | Admitting: Family Medicine

## 2023-08-24 MED ORDER — OXYCODONE-ACETAMINOPHEN 5-325 MG PO TABS: ORAL_TABLET | ORAL | 0 refills | Status: DC

## 2023-08-24 NOTE — Telephone Encounter (Signed)
Patient aware.

## 2023-08-25 ENCOUNTER — Other Ambulatory Visit: Payer: Self-pay | Admitting: Family Medicine

## 2023-08-28 ENCOUNTER — Ambulatory Visit
Admission: RE | Admit: 2023-08-28 | Discharge: 2023-08-28 | Disposition: A | Discharge status: Home / Self Care | Payer: BC Managed Care – PPO | Source: Ambulatory Visit | Attending: Student in an Organized Health Care Education/Training Program

## 2023-08-28 DIAGNOSIS — M5416 Radiculopathy, lumbar region: Secondary | ICD-10-CM

## 2023-09-20 ENCOUNTER — Other Ambulatory Visit: Payer: Self-pay | Admitting: Family Medicine

## 2023-09-20 DIAGNOSIS — G8929 Other chronic pain: Secondary | ICD-10-CM

## 2023-10-18 ENCOUNTER — Other Ambulatory Visit: Payer: Self-pay | Admitting: Family Medicine

## 2023-10-18 DIAGNOSIS — G8929 Other chronic pain: Secondary | ICD-10-CM

## 2023-10-22 ENCOUNTER — Other Ambulatory Visit: Payer: Self-pay | Admitting: Orthopedic Surgery

## 2023-10-22 DIAGNOSIS — M5116 Intervertebral disc disorders with radiculopathy, lumbar region: Secondary | ICD-10-CM

## 2023-10-22 DIAGNOSIS — M5416 Radiculopathy, lumbar region: Secondary | ICD-10-CM

## 2023-10-22 DIAGNOSIS — M48062 Spinal stenosis, lumbar region with neurogenic claudication: Secondary | ICD-10-CM

## 2023-11-03 ENCOUNTER — Encounter: Payer: Self-pay | Admitting: Orthopedic Surgery

## 2023-11-07 ENCOUNTER — Ambulatory Visit
Admission: RE | Admit: 2023-11-07 | Discharge: 2023-11-07 | Disposition: A | Discharge status: Home / Self Care | Payer: BC Managed Care – PPO | Source: Ambulatory Visit | Attending: Orthopedic Surgery | Admitting: Orthopedic Surgery

## 2023-11-07 DIAGNOSIS — M48062 Spinal stenosis, lumbar region with neurogenic claudication: Secondary | ICD-10-CM

## 2023-11-07 DIAGNOSIS — M5116 Intervertebral disc disorders with radiculopathy, lumbar region: Secondary | ICD-10-CM

## 2023-11-07 DIAGNOSIS — M5416 Radiculopathy, lumbar region: Secondary | ICD-10-CM

## 2023-11-10 ENCOUNTER — Other Ambulatory Visit: Payer: Self-pay | Admitting: Family Medicine

## 2023-11-15 ENCOUNTER — Other Ambulatory Visit: Payer: Self-pay | Admitting: Family Medicine

## 2023-11-15 DIAGNOSIS — G8929 Other chronic pain: Secondary | ICD-10-CM

## 2023-11-26 ENCOUNTER — Encounter: Payer: BC Managed Care – PPO | Admitting: Family Medicine

## 2023-12-16 ENCOUNTER — Telehealth: Payer: Self-pay

## 2023-12-16 ENCOUNTER — Other Ambulatory Visit: Payer: Self-pay

## 2023-12-16 MED ORDER — ALBUTEROL SULFATE HFA 108 (90 BASE) MCG/ACT IN AERS: INHALATION_SPRAY | RESPIRATORY_TRACT | 0 refills | Status: DC

## 2023-12-16 NOTE — Telephone Encounter (Signed)
 Copied from CRM [id number]. Topic: Clinical - Medication Question >> Dec 16, 2023  8:25 AM [name] wrote: Reason for CRM: Patient is calling in for a prescription refill on albuterol (VENTOLIN HFA) 108 (90 Base) MCG/ACT inhaler and Prednisone. The patient is currently out of the  medicine. The prednisone is not listed on the patient chart but [facility]. Patient wants to know if Dr. Lodema Hong can put in an order for prednisone? Patient can be contacted through MyChart. Listed below is the patient preferred pharmacy.  [id number] [place] [place] [facility] [address] AT [facility] Phone: [phone] Fax: [phone]

## 2023-12-18 MED ORDER — PREDNISONE 10 MG (21) PO TBPK: ORAL_TABLET | ORAL | 0 refills | Status: DC

## 2023-12-18 MED ORDER — ALBUTEROL SULFATE HFA 108 (90 BASE) MCG/ACT IN AERS: 2.0000 | INHALATION_SPRAY | Freq: Four times a day (QID) | RESPIRATORY_TRACT | 2 refills | Status: DC | PRN

## 2023-12-18 NOTE — Telephone Encounter (Signed)
 See pts below message. Is asking for prednisone to help with her breathing. We don't have any samples of albuterol (only trelegy and Breztri) Pls advise

## 2024-01-05 ENCOUNTER — Telehealth: Payer: Self-pay | Admitting: Family Medicine

## 2024-01-05 NOTE — Telephone Encounter (Unsigned)
 Copied from CRM [id number]. Topic: Clinical - Medication Question >> Jan 05, 2024 11:03 AM Gery Pray wrote: Reason for CRM: Patient's pharmacy no longer carry budesonide-formoterol (SYMBICORT) 80-4.5 MCG/ACT inhaler. Pharmacy suggested calling in the generic brand of the medication. Brand is Wixela 100/50. Please contact patient at [phone].

## 2024-01-06 ENCOUNTER — Encounter: Payer: BC Managed Care – PPO | Admitting: Family Medicine

## 2024-01-06 ENCOUNTER — Ambulatory Visit (INDEPENDENT_AMBULATORY_CARE_PROVIDER_SITE_OTHER): Admitting: Family Medicine

## 2024-01-06 VITALS — BP 159/97 | HR 89 | Resp 18 | Ht 67.0 in | Wt 228.4 lb

## 2024-01-06 DIAGNOSIS — Z0001 Encounter for general adult medical examination with abnormal findings: Secondary | ICD-10-CM | POA: Diagnosis not present

## 2024-01-06 DIAGNOSIS — J309 Allergic rhinitis, unspecified: Secondary | ICD-10-CM | POA: Diagnosis not present

## 2024-01-06 DIAGNOSIS — E038 Other specified hypothyroidism: Secondary | ICD-10-CM

## 2024-01-06 DIAGNOSIS — Z23 Encounter for immunization: Secondary | ICD-10-CM

## 2024-01-06 MED ORDER — MONTELUKAST SODIUM 10 MG PO TABS: 10.0000 mg | ORAL_TABLET | Freq: Every day | ORAL | 0 refills | Status: DC

## 2024-01-06 MED ORDER — OLMESARTAN MEDOXOMIL 20 MG PO TABS: 20.0000 mg | ORAL_TABLET | Freq: Every day | ORAL | 2 refills | Status: DC

## 2024-01-06 MED ORDER — ALBUTEROL SULFATE HFA 108 (90 BASE) MCG/ACT IN AERS: 2.0000 | INHALATION_SPRAY | Freq: Four times a day (QID) | RESPIRATORY_TRACT | 0 refills | Status: DC | PRN

## 2024-01-06 MED ORDER — AMLODIPINE BESYLATE 10 MG PO TABS: 10.0000 mg | ORAL_TABLET | Freq: Every day | ORAL | 2 refills | Status: DC

## 2024-01-06 NOTE — Patient Instructions (Addendum)
 F/U In 3 months, call if you need me sooner   Pneumonia 20 today   Labs today ordered in 07/2023  Please schedule mammogram at checkout,  Meds as listed, may need to adjust thyroid med and will be in touch re weight loss med once labs are in  It is important that you exercise regularly at least 30 minutes 5 times a week. If you develop chest pain, have severe difficulty breathing, or feel very tired, stop exercising immediately and seek medical attention  Thanks for choosing [facility], we consider it a privelige to serve you.

## 2024-01-06 NOTE — Telephone Encounter (Signed)
 Pt requesting med be switched, pls advise

## 2024-01-07 ENCOUNTER — Encounter: Payer: Self-pay | Admitting: Family Medicine

## 2024-01-07 LAB — CMP14+EGFR
ALT: 19 IU/L (ref 0–32)
AST: 20 IU/L (ref 0–40)
Albumin: 4.5 g/dL (ref 3.8–4.9)
Alkaline Phosphatase: 96 IU/L (ref 44–121)
BUN/Creatinine Ratio: 20 (ref 9–23)
BUN: 19 mg/dL (ref 6–24)
Bilirubin Total: 0.2 mg/dL (ref 0.0–1.2)
CO2: 25 mmol/L (ref 20–29)
Calcium: 9.9 mg/dL (ref 8.7–10.2)
Chloride: 106 mmol/L (ref 96–106)
Creatinine, Ser: 0.97 mg/dL (ref 0.57–1.00)
Globulin, Total: 2.5 g/dL (ref 1.5–4.5)
Glucose: 96 mg/dL (ref 70–99)
Potassium: 4.3 mmol/L (ref 3.5–5.2)
Sodium: 145 mmol/L — ABNORMAL HIGH (ref 134–144)
Total Protein: 7 g/dL (ref 6.0–8.5)
eGFR: 69 mL/min/{1.73_m2} (ref >59)

## 2024-01-07 LAB — CBC WITH DIFFERENTIAL/PLATELET
Basophils Absolute: 0 10*3/uL (ref 0.0–0.2)
Basos: 0 %
EOS (ABSOLUTE): 0.3 10*3/uL (ref 0.0–0.4)
Eos: 4 %
Hematocrit: 40.6 % (ref 34.0–46.6)
Hemoglobin: 13.2 g/dL (ref 11.1–15.9)
Immature Grans (Abs): 0.1 10*3/uL (ref 0.0–0.1)
Immature Granulocytes: 1 %
Lymphocytes Absolute: 2.1 10*3/uL (ref 0.7–3.1)
Lymphs: 23 %
MCH: 28.4 pg (ref 26.6–33.0)
MCHC: 32.5 g/dL (ref 31.5–35.7)
MCV: 88 fL (ref 79–97)
Monocytes Absolute: 0.7 10*3/uL (ref 0.1–0.9)
Monocytes: 8 %
Neutrophils Absolute: 5.8 10*3/uL (ref 1.4–7.0)
Neutrophils: 64 %
Platelets: 326 10*3/uL (ref 150–450)
RBC: 4.64 x10E6/uL (ref 3.77–5.28)
RDW: 13.2 % (ref 11.7–15.4)
WBC: 9 10*3/uL (ref 3.4–10.8)

## 2024-01-07 LAB — LIPID PANEL
Chol/HDL Ratio: 2.9 ratio (ref 0.0–4.4)
Cholesterol, Total: 205 mg/dL — ABNORMAL HIGH (ref 100–199)
HDL: 71 mg/dL (ref >39)
LDL Chol Calc (NIH): 115 mg/dL — ABNORMAL HIGH (ref 0–99)
Triglycerides: 106 mg/dL (ref 0–149)
VLDL Cholesterol Cal: 19 mg/dL (ref 5–40)

## 2024-01-07 LAB — HEMOGLOBIN A1C
Est. average glucose Bld gHb Est-mCnc: 126 mg/dL
Hgb A1c MFr Bld: 6 % — ABNORMAL HIGH (ref 4.8–5.6)

## 2024-01-07 LAB — TSH: TSH: 1.19 u[IU]/mL (ref 0.450–4.500)

## 2024-01-07 LAB — VITAMIN D 25 HYDROXY (VIT D DEFICIENCY, FRACTURES): Vit D, 25-Hydroxy: 23.2 ng/mL — ABNORMAL LOW (ref 30.0–100.0)

## 2024-01-07 MED ORDER — LEVOTHYROXINE SODIUM 100 MCG PO TABS
100.0000 ug | ORAL_TABLET | Freq: Every day | ORAL | 2 refills | Status: DC
Start: 2024-01-07 — End: 2024-08-08

## 2024-01-07 MED ORDER — VITAMIN D (ERGOCALCIFEROL) 1.25 MG (50000 UNIT) PO CAPS: 50000.0000 [IU] | ORAL_CAPSULE | ORAL | 8 refills | Status: AC

## 2024-01-10 ENCOUNTER — Encounter: Payer: Self-pay | Admitting: Family Medicine

## 2024-01-10 ENCOUNTER — Other Ambulatory Visit: Payer: Self-pay | Admitting: Family Medicine

## 2024-01-10 MED ORDER — SEMAGLUTIDE-WEIGHT MANAGEMENT 0.25 MG/0.5ML ~~LOC~~ SOAJ: 0.2500 mg | SUBCUTANEOUS | 0 refills | Status: AC

## 2024-01-10 NOTE — Addendum Note (Signed)
 Addended by: Syliva Overman E on: 01/10/2024 11:02 AM   Modules accepted: Orders

## 2024-01-11 NOTE — Telephone Encounter (Signed)
 Attempted to contact pharmacy to make sure rx went through. Unable to speak to pharmacy tech.

## 2024-01-11 NOTE — Telephone Encounter (Signed)
Please refer to other TE

## 2024-01-12 ENCOUNTER — Other Ambulatory Visit (HOSPITAL_COMMUNITY): Payer: Self-pay | Admitting: Family Medicine

## 2024-01-12 DIAGNOSIS — Z1231 Encounter for screening mammogram for malignant neoplasm of breast: Secondary | ICD-10-CM

## 2024-01-14 ENCOUNTER — Telehealth: Payer: Self-pay | Admitting: Pharmacy Technician

## 2024-01-14 ENCOUNTER — Other Ambulatory Visit (HOSPITAL_COMMUNITY): Payer: Self-pay

## 2024-01-14 NOTE — Telephone Encounter (Signed)
 Pharmacy Patient Advocate Encounter   Received notification from  Patient's Insurance  that prior authorization for Ozempic (0.25 or 0.5 MG/DOSE) 2MG /3ML pen-injectors is required/requested.   Insurance verification completed.   The patient is insured through [facility] .   Not sure why the insurance company initiated prior authorization request for this medication for this patient. I don't see the medication on her med profile. She is not type 2 diabetic and would not meet coverage criteria for this medication. I do see Wegovy for weight loss. Would you like me to initiate a prior authorization request for the [facility]?

## 2024-01-18 ENCOUNTER — Other Ambulatory Visit (HOSPITAL_COMMUNITY): Payer: Self-pay

## 2024-01-18 ENCOUNTER — Telehealth: Payer: Self-pay | Admitting: Pharmacy Technician

## 2024-01-18 ENCOUNTER — Other Ambulatory Visit: Payer: Self-pay | Admitting: Family Medicine

## 2024-01-18 NOTE — Telephone Encounter (Signed)
 Pharmacy Patient Advocate Encounter   Received notification from RX Request Messages that prior authorization for ALBUTEROL HFA 90 MCG INHALER is required/requested.   Insurance verification completed.   The patient is insured through [facility] .   Per test claim: The current 30 day co-pay is, $15.00.  No PA needed at this time. This test claim was processed through [facility]- copay amounts may vary at other pharmacies due to pharmacy/plan contracts, or as the patient moves through the different stages of their insurance plan.    Insurance covers select NDC's. Called [facility] and advised them to reprocess claim for a covered NDC.

## 2024-01-18 NOTE — Telephone Encounter (Signed)
 PA request has been Received. New Encounter has been or will be created for follow up. For additional info see Pharmacy Prior Auth telephone encounter from 01/18/2024.

## 2024-01-19 ENCOUNTER — Other Ambulatory Visit (HOSPITAL_COMMUNITY): Payer: Self-pay

## 2024-01-19 ENCOUNTER — Telehealth: Payer: Self-pay | Admitting: Pharmacy Technician

## 2024-01-19 NOTE — Telephone Encounter (Signed)
 PA request has been Started. New Encounter has been or will be created for follow up. For additional info see Pharmacy Prior Auth telephone encounter from 01/19/2024.

## 2024-01-19 NOTE — Telephone Encounter (Signed)
 Pharmacy Patient Advocate Encounter   Received notification from Pt Calls Messages that prior authorization for Wegovy 0.25MG /0.5ML auto-injectors is required/requested.   Insurance verification completed.   The patient is insured through [facility] .   Per test claim: PA required; PA started via CoverMyMeds. KEY [id number] . Waiting for clinical questions to populate.

## 2024-01-20 ENCOUNTER — Other Ambulatory Visit (HOSPITAL_COMMUNITY): Payer: Self-pay

## 2024-01-20 NOTE — Telephone Encounter (Signed)
 Pharmacy Patient Advocate Encounter  Received notification from [facility] that Prior Authorization for [facility] 0.25MG /0.5ML auto-injectors has been APPROVED from 01/20/2024 to 08/17/2024. Ran test claim, Copay is $35.00. This test claim was processed through [facility]- copay amounts may vary at other pharmacies due to pharmacy/plan contracts, or as the patient moves through the different stages of their insurance plan.   PA #/Case ID/Reference #: Key: [id number]

## 2024-01-20 NOTE — Telephone Encounter (Signed)
 Pharmacy Patient Advocate Encounter   Received notification from Pt Calls Messages that prior authorization for Wegovy 0.25MG /0.5ML auto-injectors is required/requested.   Insurance verification completed.   The patient is insured through [facility] .   Per test claim: PA required; PA submitted to above mentioned insurance via CoverMyMeds Key/confirmation #/EOC [id number] Status is pending

## 2024-01-22 ENCOUNTER — Encounter: Payer: Self-pay | Admitting: Family Medicine

## 2024-01-22 DIAGNOSIS — Z23 Encounter for immunization: Secondary | ICD-10-CM | POA: Insufficient documentation

## 2024-01-22 DIAGNOSIS — Z0001 Encounter for general adult medical examination with abnormal findings: Secondary | ICD-10-CM | POA: Insufficient documentation

## 2024-01-22 NOTE — Assessment & Plan Note (Signed)
 Annual exam as documented. . Immunization and cancer screening needs are specifically addressed at this visit.

## 2024-01-22 NOTE — Progress Notes (Signed)
    [name]     MRN: [id number]      DOB: May 19, 1966  Chief Complaint  Patient presents with   Annual Exam    Annual Exam. Would like to discuss weightloss. Also states she has been experiencing SOB since starting back work.     HPI: Patient is in for annual physical exam.  Immunization is reviewed , and  updated   PE: BP (!) 159/97   Pulse 89   Resp 18   Ht 5\' 7"  (1.702 m)   Wt 228 lb 6.4 oz (103.6 kg)   SpO2 96%   BMI 35.77 kg/m   Pleasant  female, alert and oriented x 3, in no cardio-pulmonary distress. Afebrile. HEENT No facial trauma or asymetry. Sinuses non tender.  Extra occullar muscles intact.. External ears normal, . Neck: supple, no adenopathy,JVD or thyromegaly.No bruits.  Chest: Clear to ascultation bilaterally.No crackles or wheezes. Non tender to palpation  Breast: Has upcoming mammogram, asymptomatic , not examined  Cardiovascular system; Heart sounds normal,  S1 and  S2 ,no S3.  No murmur, or thrill. Apical beat not displaced Peripheral pulses normal.  Abdomen: Soft, non tender, no organomegaly or masses. .   Musculoskeletal exam: Full ROM of spine, hips , shoulders and knees. No deformity ,swelling or crepitus noted. No muscle wasting or atrophy.   Neurologic: Cranial nerves 2 to 12 intact. Power, tone ,sensation and reflexes normal throughout. No disturbance in gait. No tremor.  Skin: Intact, no ulceration, erythema , scaling or rash noted. Pigmentation normal throughout  Psych; Normal mood and affect. Judgement and concentration normal   Assessment & Plan:  Annual visit for general adult medical examination with abnormal findings Annual exam as documented.  Immunization and cancer screening needs are specifically addressed at this visit.   Encounter for immunization After obtaining informed consent, the  pneumonia 20 vaccine is  administered , with no adverse effect noted at the time of administration.

## 2024-01-22 NOTE — Assessment & Plan Note (Signed)
 After obtaining informed consent, the pneumonia 20  vaccine is  administered , with no adverse effect noted at the time of administration.

## 2024-01-25 ENCOUNTER — Encounter (HOSPITAL_COMMUNITY): Payer: Self-pay

## 2024-01-25 ENCOUNTER — Ambulatory Visit (HOSPITAL_COMMUNITY)
Admission: RE | Admit: 2024-01-25 | Discharge: 2024-01-25 | Disposition: A | Discharge status: Home / Self Care | Source: Ambulatory Visit | Attending: Family Medicine | Admitting: Family Medicine

## 2024-01-25 DIAGNOSIS — Z1231 Encounter for screening mammogram for malignant neoplasm of breast: Secondary | ICD-10-CM | POA: Insufficient documentation

## 2024-02-11 ENCOUNTER — Ambulatory Visit: Payer: Self-pay

## 2024-02-11 MED ORDER — FLUTICASONE-SALMETEROL 100-50 MCG/ACT IN AEPB: 1.0000 | INHALATION_SPRAY | Freq: Two times a day (BID) | RESPIRATORY_TRACT | 5 refills | Status: DC

## 2024-02-11 NOTE — Telephone Encounter (Signed)
 Patient states out of symbicort  for 2 weeks. Pharmacy said formulary is wixela 100/50. Can this be called in for patient? Previous note said would be addressed at her last appt but I do not see that it was. Pls advise.

## 2024-02-11 NOTE — Telephone Encounter (Signed)
 Called and also left voicemail making pt aware

## 2024-02-11 NOTE — Telephone Encounter (Signed)
 Reason for Disposition  Cough has been present for > 3 weeks    Pt has been without her Symbicort  for 2 weeks because [facility] not [facility].   "I think my insurance has stopped covering it".   "I need something else ordered in its place that my insurance will cover".  Answer Assessment - Initial Assessment Questions 1. ONSET: "When did the cough begin?"      I take asthma medicine every day.   [facility]'t [facility] Symbicort .   My insurance won't cover it, I don't think.  I need a different medication in its place that my insurance will cover.   I'm needing my asthma medicine.   I've been without it for 2 weeks now.    Now I'm coughing and wheezing from not having my medicine. 2. SEVERITY: "How bad is the cough today?"      I have a non productive dry cough and a little wheezing and shortness of breath because I've been without my Symbicort  for 2 weeks because [facility] won't [facility] it.   3. SPUTUM: "Describe the color of your sputum" (none, dry cough; clear, white, yellow, green)     Not coughing up anything. 4. HEMOPTYSIS: "Are you coughing up any blood?" If so ask: "How much?" (flecks, streaks, tablespoons, etc.)     Not asked 5. DIFFICULTY BREATHING: "Are you having difficulty breathing?" If Yes, ask: "How bad is it?" (e.g., mild, moderate, severe)    - MILD: No SOB at rest, mild SOB with walking, speaks normally in sentences, can lie down, no retractions, pulse < 100.    - MODERATE: SOB at rest, SOB with minimal exertion and prefers to sit, cannot lie down flat, speaks in phrases, mild retractions, audible wheezing, pulse 100-120.    - SEVERE: Very SOB at rest, speaks in single words, struggling to breathe, sitting hunched forward, retractions, pulse > 120    Mild wheezing  A little shortness of breath.    6. FEVER: "Do you have a fever?" If Yes, ask: "What is your temperature, how was it measured, and when did it start?"     No  7. CARDIAC HISTORY: "Do you have any history of heart disease?" (e.g.,  heart attack, congestive heart failure)      Not asked 8. LUNG HISTORY: "Do you have any history of lung disease?"  (e.g., pulmonary embolus, asthma, emphysema)     asthma 9. PE RISK FACTORS: "Do you have a history of blood clots?" (or: recent major surgery, recent prolonged travel, bedridden)     Not asked 10. OTHER SYMPTOMS: "Do you have any other symptoms?" (e.g., runny nose, wheezing, chest pain)       Mild wheezing and shortness of breath because I've been without my asthma medicine.     Denies fever, nasal congestion, sore throat or other symptoms. 11. PREGNANCY: "Is there any chance you are pregnant?" "When was your last menstrual period?"       Not asked due to age 58. TRAVEL: "Have you traveled out of the country in the last month?" (e.g., travel history, exposures)       Not asked  Protocols used: Cough - Acute Non-Productive-A-AH  Chief Complaint: [facility] Symbicort .   She has been without it for 2 weeks.  "I take this every day for my asthma".  Symptoms: non productive cough with mild wheezing and shortness of breath because she has been without her Symbicort  for 2 weeks.    "  I don't think my insurance covers this anymore so I need something else prescribed in its place.    Last office visit was 01/06/2024 with Dr. Rodolph Clap for CPE.    Symbicort  is not on her medication list.  Frequency: For the last 2 weeks due to being out of her Symbicort . Pertinent Negatives: Patient denies fever, sore throat, nasal congestion Disposition: [] ED /[] Urgent Care (no appt availability in office) / [] Appointment(In office/virtual)/ []  [facility]/ [] Home Care/ [] Refused Recommended Disposition /[] [facility]/ [x]  Follow-up with PCP Additional Notes: Message sent to Dr. Alberteen Huge regarding her needing her Symbicort  but does not think insurance covers it anymore.   Needing a generic or something else in its place for her asthma.

## 2024-02-11 NOTE — Telephone Encounter (Signed)
 This RN attempted 2nd return call attempt. No answer. LVM.

## 2024-02-11 NOTE — Telephone Encounter (Signed)
 Copied from CRM [id number]. Topic: Clinical - Prescription Issue >> Feb 11, 2024  8:27 AM Stanly Early wrote: Reason for CRM: wheezing and coughing regular asthma medicine patient pharmacy wont refill medication, Patient asked if her primary can contact her through MyChart to see if she can get prescribed a generic brand RX    1st attempt at calling patient--left a voicemail

## 2024-02-11 NOTE — Addendum Note (Signed)
 Addended by: Towanda Fret on: 02/11/2024 03:21 PM   Modules accepted: Orders

## 2024-02-15 ENCOUNTER — Other Ambulatory Visit: Payer: Self-pay

## 2024-02-15 MED ORDER — FLUTICASONE-SALMETEROL 100-50 MCG/ACT IN AEPB: 1.0000 | INHALATION_SPRAY | Freq: Two times a day (BID) | RESPIRATORY_TRACT | 5 refills | Status: DC

## 2024-02-15 NOTE — Telephone Encounter (Unsigned)
 Copied from CRM [id number]. Topic: Clinical - Prescription Issue >> Feb 15, 2024  8:33 AM Baldomero Bone wrote: Reason for CRM: Patient wants to speak with Brandi regarding her fluticasone -salmeterol (WIXELA INHUB) 100-50 MCG/ACT AEPB. [facility] regarding the medication. Callback number is [phone]

## 2024-02-17 MED ORDER — ALBUTEROL SULFATE (2.5 MG/3ML) 0.083% IN NEBU: 2.5000 mg | INHALATION_SOLUTION | Freq: Four times a day (QID) | RESPIRATORY_TRACT | 3 refills | Status: AC | PRN

## 2024-02-17 NOTE — Telephone Encounter (Signed)
 I do not see any neb medication on her active med list. Please advise. [facility]ses [facility]

## 2024-03-04 ENCOUNTER — Other Ambulatory Visit: Payer: Self-pay | Admitting: Family Medicine

## 2024-03-18 ENCOUNTER — Other Ambulatory Visit: Payer: Self-pay

## 2024-03-18 MED ORDER — TRIAMCINOLONE ACETONIDE 0.1 % EX CREA: TOPICAL_CREAM | CUTANEOUS | 0 refills | Status: AC

## 2024-04-03 ENCOUNTER — Other Ambulatory Visit: Payer: Self-pay | Admitting: Family Medicine

## 2024-04-03 DIAGNOSIS — J309 Allergic rhinitis, unspecified: Secondary | ICD-10-CM

## 2024-04-08 ENCOUNTER — Ambulatory Visit: Admitting: Family Medicine

## 2024-05-13 ENCOUNTER — Ambulatory Visit: Admitting: Family Medicine

## 2024-05-27 ENCOUNTER — Ambulatory Visit: Admitting: Family Medicine

## 2024-05-30 ENCOUNTER — Other Ambulatory Visit: Payer: Self-pay | Admitting: Family Medicine

## 2024-06-17 ENCOUNTER — Ambulatory Visit: Admitting: Family Medicine

## 2024-06-17 ENCOUNTER — Telehealth: Payer: Self-pay | Admitting: Family Medicine

## 2024-06-17 ENCOUNTER — Ambulatory Visit: Admitting: Nurse Practitioner

## 2024-06-17 ENCOUNTER — Telehealth: Payer: Self-pay

## 2024-06-17 NOTE — Telephone Encounter (Signed)
 FMLA forms for asthma   Noted Copied Sleeved Original placed in provider in office Placed copy in front desk folder  Needs by 9/19

## 2024-06-17 NOTE — Telephone Encounter (Signed)
 Copied from CRM #[id number]. Topic: Appointments - Appointment Info/Confirmation >> Jun 17, 2024 12:44 PM Marissa P wrote: Patient/patient representative is calling for information regarding an appointment.  Please give patient a call right away today she was not aware prior to the day of her appt her appt time changed

## 2024-06-17 NOTE — Telephone Encounter (Signed)
 APPT WITH PATEL ON 09/12, PLACED IN HIS BOX

## 2024-06-17 NOTE — Telephone Encounter (Signed)
 Patient came into office and reschedule appointment and dropped off FMLA forms

## 2024-06-24 ENCOUNTER — Encounter: Payer: Self-pay | Admitting: Internal Medicine

## 2024-06-24 ENCOUNTER — Ambulatory Visit (INDEPENDENT_AMBULATORY_CARE_PROVIDER_SITE_OTHER): Payer: Self-pay | Admitting: Internal Medicine

## 2024-06-24 VITALS — BP 119/76 | HR 87 | Ht 67.0 in | Wt 221.2 lb

## 2024-06-24 DIAGNOSIS — E039 Hypothyroidism, unspecified: Secondary | ICD-10-CM

## 2024-06-24 DIAGNOSIS — I10 Essential (primary) hypertension: Secondary | ICD-10-CM | POA: Diagnosis not present

## 2024-06-24 DIAGNOSIS — R7303 Prediabetes: Secondary | ICD-10-CM

## 2024-06-24 DIAGNOSIS — G5602 Carpal tunnel syndrome, left upper limb: Secondary | ICD-10-CM

## 2024-06-24 DIAGNOSIS — Z23 Encounter for immunization: Secondary | ICD-10-CM | POA: Diagnosis not present

## 2024-06-24 DIAGNOSIS — M51362 Other intervertebral disc degeneration, lumbar region with discogenic back pain and lower extremity pain: Secondary | ICD-10-CM | POA: Diagnosis not present

## 2024-06-24 MED ORDER — NAPROXEN 500 MG PO TABS: 500.0000 mg | ORAL_TABLET | Freq: Two times a day (BID) | ORAL | 2 refills | Status: AC | PRN

## 2024-06-24 NOTE — Assessment & Plan Note (Signed)
 Lab Results  Component Value Date   HGBA1C 6.0 (H) 01/06/2024   Advised to follow DASH diet

## 2024-06-24 NOTE — Assessment & Plan Note (Signed)
 Has chronic low back pain, with radicular symptoms to RLE Has had lumbar spine surgery in the past, was followed by spine surgeon in [facility] Naproxen  as needed for pain, advised to alternate with Tylenol  arthritis Avoid heavy lifting and frequent bending

## 2024-06-24 NOTE — Patient Instructions (Signed)
 Please continue to take medications as prescribed.  Please continue to follow low carb diet and perform moderate exercise/walking at least 150 mins/week.

## 2024-06-24 NOTE — Assessment & Plan Note (Signed)
 Has chronic numbness and tingling of the left hand Has tried wrist brace with mild improvement only Referred to Hand Surgery

## 2024-06-24 NOTE — Progress Notes (Signed)
 Established Patient Office Visit  Subjective:  Patient ID: [name], female    DOB: January 20, 1966  Age: 58 y.o. MRN: [id number]  CC:  Chief Complaint  Patient presents with   Hand Pain    Reports carpel tunnel flare up. Fingers are numb.    Hip Pain    Reports pain from switching jobs. Pain worsens at night.     HPI [name] is a 58 y.o. female with past medical history of HTN, asthma, hypothyroidism and DDD of lumbar spine who presents for f/u of her chronic medical conditions. She is a patient of Dr. Antonetta.  HTN: BP is well-controlled. Takes amlodipine  10 mg QD regularly.  Chart review suggests olmesartan  20 mg QD as well, but has not taken it recently. Patient denies headache, dizziness, chest pain, dyspnea or palpitations.  Asthma: She uses Wixela as maintenance inhaler and has as needed albuterol  inhaler.  She takes Singulair  for allergies.  She has to use albuterol  neb for asthma flareups at times, FMLA paperwork provided.  Hypothyroidism: She takes levothyroxine  100 mcg QD currently.  Denies any recent change in weight or appetite.  DDD of lumbar spine: She used to be followed by spine surgeon in [facility].  She has chronic low back pain, radiating to RLE.  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  She denies any numbness or tingling of the LE currently.  Left-sided carpal tunnel syndrome: She has chronic numbness and tingling of the left hand and fingers.  She has tried using wrist brace with mild relief.  Due to her recent change in work, she has to perform repetitive wrist movement, which is worsening her hand symptoms.   Past Medical History:  Diagnosis Date   Asthma 2010   Eczema since childhoosd   flare since 2011   Fibroids    Hypertension    Menorrhagia     Past Surgical History:  Procedure Laterality Date   ANKLE SURGERY Right     BACK SURGERY     COLONOSCOPY N/A 04/27/2020   Procedure: COLONOSCOPY;  Surgeon: Shaaron Lamar HERO, MD;  Location: [facility];  Service: Endoscopy;  Laterality: N/A;  7:30   KNEE SURGERY Left    TUBAL LIGATION      Family History  Problem Relation Age of Onset   Asthma Paternal Aunt    Asthma Paternal Uncle    Liver disease Paternal Grandfather    Aneurysm Maternal Grandfather        brain   Heart attack Father     Social History   Socioeconomic History   Marital status: Married    Spouse name: Not on file   Number of children: Not on file   Years of education: Not on file   Highest education level: Not on file  Occupational History   Occupation: works with chemicals  Tobacco Use   Smoking status: Never   Smokeless tobacco: Never  Vaping Use   Vaping status: Never Used  Substance and Sexual Activity   Alcohol use: Yes    Comment: 1 glass of wine twice a week   Drug use: No   Sexual activity: Yes    Birth control/protection: Surgical    Comment: tubal  Other Topics Concern   Not on file  Social History Narrative   Not on file   Social  Drivers of Corporate investment banker Strain: Not on file  Food Insecurity: Not on file  Transportation Needs: Not on file  Physical Activity: Not on file  Stress: Not on file  Social Connections: Not on file  Intimate Partner Violence: Not on file    Outpatient Medications Prior to Visit  Medication Sig Dispense Refill   albuterol  (PROVENTIL ) (2.5 MG/3ML) 0.083% nebulizer solution Take 3 mLs (2.5 mg total) by nebulization every 6 (six) hours as needed for wheezing or shortness of breath. 150 mL 3   albuterol  (VENTOLIN  HFA) 108 (90 Base) MCG/ACT inhaler TAKE 2 PUFFS BY MOUTH EVERY 6 HOURS AS NEEDED FOR WHEEZE OR SHORTNESS OF BREATH 8.5 each 2   amLODipine  (NORVASC ) 10 MG tablet Take 1 tablet (10 mg total) by mouth daily. 90 tablet 2   fluticasone -salmeterol (WIXELA INHUB) 100-50 MCG/ACT AEPB Inhale 1 puff into the lungs 2  (two) times daily. 60 each 5   levothyroxine  (SYNTHROID ) 100 MCG tablet Take 1 tablet (100 mcg total) by mouth daily. 90 tablet 2   montelukast  (SINGULAIR ) 10 MG tablet TAKE 1 TABLET BY MOUTH EVERY DAY 30 tablet 2   triamcinolone  cream (KENALOG ) 0.1 % APPLY TO AFFECTED AREA ON BODY (NOT FACE) TWICE DAILY FOR 1-2 WEEKS 454 g 0   Vitamin D , Ergocalciferol , (DRISDOL ) 1.25 MG (50000 UNIT) CAPS capsule Take 1 capsule (50,000 Units total) by mouth every 7 (seven) days. 4 capsule 8   albuterol  (VENTOLIN  HFA) 108 (90 Base) MCG/ACT inhaler TAKE 2 PUFFS BY MOUTH EVERY 6 HOURS AS NEEDED FOR WHEEZE OR SHORTNESS OF BREATH 8.5 each 0   albuterol  (VENTOLIN  HFA) 108 (90 Base) MCG/ACT inhaler Inhale 2 puffs into the lungs every 6 (six) hours as needed for wheezing or shortness of breath. 18 g 0   olmesartan  (BENICAR ) 20 MG tablet TAKE 1 TABLET BY MOUTH EVERY DAY 90 tablet 3   No facility-administered medications prior to visit.    Allergies  Allergen Reactions   Bactrim  [Sulfamethoxazole -Trimethoprim ] Other (See Comments)    Numbness, mouth pain, tightness in throat   Ciprofloxacin  Dermatitis   Flagyl  [Metronidazole ] Dermatitis   Penicillins     Has patient had a PCN reaction causing immediate rash, facial/tongue/throat swelling, SOB or lightheadedness with hypotension: Yes Has patient had a PCN reaction causing severe rash involving mucus membranes or skin necrosis: No Has patient had a PCN reaction that required hospitalization No Has patient had a PCN reaction occurring within the last 10 years: NoN  If all of the above answers are NO, then may proceed with Cephalosporin use.     REACTION: At age 79 had heel sore and was put on R   Latex Rash    ROS Review of Systems  Constitutional:  Negative for chills and fever.  HENT:  Negative for congestion and sore throat.   Eyes:  Negative for pain and discharge.  Respiratory:  Negative for cough and shortness of breath.   Cardiovascular:  Negative  for chest pain and palpitations.  Gastrointestinal:  Negative for abdominal pain, diarrhea, nausea and vomiting.  Endocrine: Negative for polydipsia and polyuria.  Genitourinary:  Negative for dysuria and hematuria.  Musculoskeletal:  Positive for back pain. Negative for neck pain and neck stiffness.  Skin:  Negative for rash.  Neurological:  Positive for numbness (Left hand). Negative for dizziness and weakness.  Psychiatric/Behavioral:  Negative for agitation and behavioral problems.       Objective:    Physical Exam Vitals reviewed.  Constitutional:      General: She is not in acute distress.    Appearance: She is not diaphoretic.  HENT:     Head: Normocephalic and atraumatic.     Nose: Nose normal. No congestion.     Mouth/Throat:     Mouth: Mucous membranes are moist.     Pharynx: No posterior oropharyngeal erythema.  Eyes:     General: No scleral icterus.    Extraocular Movements: Extraocular movements intact.  Cardiovascular:     Rate and Rhythm: Normal rate and regular rhythm.     Heart sounds: Normal heart sounds. No murmur heard. Pulmonary:     Breath sounds: Normal breath sounds. No wheezing or rales.  Musculoskeletal:     Cervical back: Neck supple. No tenderness.     Lumbar back: Tenderness present. Normal range of motion. Positive right straight leg raise test. Negative left straight leg raise test.     Right lower leg: No edema.     Left lower leg: No edema.  Skin:    General: Skin is warm.     Findings: No rash.  Neurological:     General: No focal deficit present.     Mental Status: She is alert and oriented to person, place, and time.  Psychiatric:        Mood and Affect: Mood normal.        Behavior: Behavior normal.     BP 119/76   Pulse 87   Ht 5' 7 (1.702 m)   Wt 221 lb 3.2 oz (100.3 kg)   LMP 12/03/2022 (Exact Date)   SpO2 96%   BMI 34.64 kg/m  Wt Readings from Last 3 Encounters:  06/24/24 221 lb 3.2 oz (100.3 kg)  01/06/24 228 lb 6.4  oz (103.6 kg)  07/24/23 233 lb 1.9 oz (105.7 kg)    Lab Results  Component Value Date   TSH 1.190 01/06/2024   Lab Results  Component Value Date   WBC 9.0 01/06/2024   HGB 13.2 01/06/2024   HCT 40.6 01/06/2024   MCV 88 01/06/2024   PLT 326 01/06/2024   Lab Results  Component Value Date   NA 145 (H) 01/06/2024   K 4.3 01/06/2024   CO2 25 01/06/2024   GLUCOSE 96 01/06/2024   BUN 19 01/06/2024   CREATININE 0.97 01/06/2024   BILITOT 0.2 01/06/2024   ALKPHOS 96 01/06/2024   AST 20 01/06/2024   ALT 19 01/06/2024   PROT 7.0 01/06/2024   ALBUMIN 4.5 01/06/2024   CALCIUM  9.9 01/06/2024   ANIONGAP 7 04/23/2021   EGFR 69 01/06/2024   Lab Results  Component Value Date   CHOL 205 (H) 01/06/2024   Lab Results  Component Value Date   HDL 71 01/06/2024   Lab Results  Component Value Date   LDLCALC 115 (H) 01/06/2024   Lab Results  Component Value Date   TRIG 106 01/06/2024   Lab Results  Component Value Date   CHOLHDL 2.9 01/06/2024   Lab Results  Component Value Date   HGBA1C 6.0 (H) 01/06/2024      Assessment & Plan:   Problem List Items Addressed This Visit       Cardiovascular and Mediastinum   Essential hypertension - Primary   BP Readings from Last 1 Encounters:  06/24/24 119/76   Well-controlled with amlodipine  10 mg QD Discontinue olmesartan  as her BP has been WNL without it Counseled for compliance with the medications Advised DASH diet and moderate exercise/walking, at  least 150 mins/week      Relevant Orders   CMP14+EGFR     Endocrine   Hypothyroidism   Lab Results  Component Value Date   TSH 1.190 01/06/2024   Continue levothyroxine  100 mcg QD Check TSH and free T4      Relevant Orders   TSH + free T4     Nervous and Auditory   Left carpal tunnel syndrome   Has chronic numbness and tingling of the left hand Has tried wrist brace with mild improvement only Referred to Hand Surgery      Relevant Orders   Ambulatory referral  to Orthopedic Surgery     Musculoskeletal and Integument   Degeneration of intervertebral disc of lumbar region with discogenic back pain and lower extremity pain   Has chronic low back pain, with radicular symptoms to RLE Has had lumbar spine surgery in the past, was followed by spine surgeon in [place] Naproxen  as needed for pain, advised to alternate with Tylenol  arthritis Avoid heavy lifting and frequent bending       Relevant Medications   naproxen  (NAPROSYN ) 500 MG tablet     Other   Prediabetes   Lab Results  Component Value Date   HGBA1C 6.0 (H) 01/06/2024   Advised to follow DASH diet      Relevant Orders   Hemoglobin A1c   Encounter for immunization   Relevant Orders   Flu vaccine trivalent PF, 6mos and older(Flulaval,Afluria,Fluarix,Fluzone) (Completed)    Meds ordered this encounter  Medications   naproxen  (NAPROSYN ) 500 MG tablet    Sig: Take 1 tablet (500 mg total) by mouth 2 (two) times daily as needed for mild pain (pain score 1-3) or moderate pain (pain score 4-6).    Dispense:  30 tablet    Refill:  2    Follow-up: Return in about 7 months (around 01/22/2025) for Annual physical.    Suzzane MARLA Blanch, MD

## 2024-06-24 NOTE — Assessment & Plan Note (Signed)
 BP Readings from Last 1 Encounters:  06/24/24 119/76   Well-controlled with amlodipine  10 mg QD Discontinue olmesartan  as her BP has been WNL without it Counseled for compliance with the medications Advised DASH diet and moderate exercise/walking, at least 150 mins/week

## 2024-06-24 NOTE — Assessment & Plan Note (Signed)
 Lab Results  Component Value Date   TSH 1.190 01/06/2024   Continue levothyroxine  100 mcg QD Check TSH and free T4

## 2024-06-25 LAB — CMP14+EGFR
ALT: 27 IU/L (ref 0–32)
AST: 29 IU/L (ref 0–40)
Albumin: 4.4 g/dL (ref 3.8–4.9)
Alkaline Phosphatase: 130 IU/L — ABNORMAL HIGH (ref 44–121)
BUN/Creatinine Ratio: 13 (ref 9–23)
BUN: 12 mg/dL (ref 6–24)
Bilirubin Total: 0.5 mg/dL (ref 0.0–1.2)
CO2: 26 mmol/L (ref 20–29)
Calcium: 10 mg/dL (ref 8.7–10.2)
Chloride: 101 mmol/L (ref 96–106)
Creatinine, Ser: 0.92 mg/dL (ref 0.57–1.00)
Globulin, Total: 2.7 g/dL (ref 1.5–4.5)
Glucose: 100 mg/dL — ABNORMAL HIGH (ref 70–99)
Potassium: 4.1 mmol/L (ref 3.5–5.2)
Sodium: 141 mmol/L (ref 134–144)
Total Protein: 7.1 g/dL (ref 6.0–8.5)
eGFR: 73 mL/min/1.73 (ref >59)

## 2024-06-25 LAB — HEMOGLOBIN A1C
Est. average glucose Bld gHb Est-mCnc: 120 mg/dL
Hgb A1c MFr Bld: 5.8 % — ABNORMAL HIGH (ref 4.8–5.6)

## 2024-06-25 LAB — TSH+FREE T4
Free T4: 1.51 ng/dL (ref 0.82–1.77)
TSH: 1.01 u[IU]/mL (ref 0.450–4.500)

## 2024-06-27 ENCOUNTER — Ambulatory Visit: Payer: Self-pay | Admitting: Internal Medicine

## 2024-06-30 ENCOUNTER — Ambulatory Visit: Admitting: Orthopaedic Surgery

## 2024-06-30 ENCOUNTER — Encounter: Payer: Self-pay | Admitting: Orthopaedic Surgery

## 2024-06-30 DIAGNOSIS — R202 Paresthesia of skin: Secondary | ICD-10-CM | POA: Diagnosis not present

## 2024-06-30 DIAGNOSIS — R200 Anesthesia of skin: Secondary | ICD-10-CM

## 2024-06-30 MED ORDER — CELECOXIB 200 MG PO CAPS: 200.0000 mg | ORAL_CAPSULE | Freq: Two times a day (BID) | ORAL | 1 refills | Status: AC | PRN

## 2024-06-30 MED ORDER — METHYLPREDNISOLONE 4 MG PO TABS: ORAL_TABLET | ORAL | 0 refills | Status: AC

## 2024-06-30 NOTE — Progress Notes (Signed)
 Patient is a very pleasant right-hand-dominant 58 year old who was sent to us  to evaluate left upper extremity and hand numbness and tingling.  She says this been going on for about 2 months and she changed her job and is doing more repetitive type work.  She does state it is waking her up at night and it is hard to get a comfortable position.  She reports that she is a prediabetic.  She has not had any issues like this before and denies any numbness and tingling on her right hand.  She denies any neck pain but does report that the pain does radiate up into her arm.  I was able to review her past medical history and medications within epic.  We went over these as well.  On examination of her right and left hands, there is no muscle atrophy.  She does have good pinch and grip strength on the left side.  Her triceps are not weak.  Her numbness seems to be globally in her hand and her thumb but again she reports pain radiate up into her neck.  She does not have a true positive Spurling sign on the left side with good range of motion of her neck.  I would like to send her for EMG/NCV studies to rule out carpal tunnel syndrome and to look for the potential of this being a cervical source.  Since she is only prediabetic, we will put her on a 6-day steroid taper and follow this with Celebrex  as needed twice a day.  Once we have the results of those studies we can then go from there in terms of further treatment recommendations.  All questions and concerns were answered and addressed.  She agrees to this treatment plan.

## 2024-07-01 ENCOUNTER — Other Ambulatory Visit: Payer: Self-pay

## 2024-07-01 DIAGNOSIS — R200 Anesthesia of skin: Secondary | ICD-10-CM

## 2024-07-06 ENCOUNTER — Other Ambulatory Visit: Payer: Self-pay | Admitting: Family Medicine

## 2024-07-06 DIAGNOSIS — J309 Allergic rhinitis, unspecified: Secondary | ICD-10-CM

## 2024-07-15 ENCOUNTER — Ambulatory Visit (INDEPENDENT_AMBULATORY_CARE_PROVIDER_SITE_OTHER): Admitting: Physical Medicine and Rehabilitation

## 2024-07-15 DIAGNOSIS — R202 Paresthesia of skin: Secondary | ICD-10-CM | POA: Diagnosis not present

## 2024-07-15 DIAGNOSIS — M79642 Pain in left hand: Secondary | ICD-10-CM

## 2024-07-15 DIAGNOSIS — R29898 Other symptoms and signs involving the musculoskeletal system: Secondary | ICD-10-CM

## 2024-07-15 NOTE — Progress Notes (Signed)
 Pain Scale   Average Pain 10 Patient advising she has left hand pain and numbness and tingling.. Patient is right hand dominate        +Driver, -BT, -Dye Allergies.

## 2024-07-19 NOTE — Procedures (Signed)
 EMG & NCV Findings: Evaluation of the left median motor nerve showed prolonged distal onset latency (7.0 ms) and reduced amplitude (1.0 mV).  The left median (across palm) sensory nerve showed no response (Wrist) and no response (Palm).  All remaining nerves (as indicated in the following tables) were within normal limits.    Needle evaluation of the left abductor pollicis brevis muscle showed increased insertional activity and moderately increased spontaneous activity.  All remaining muscles (as indicated in the following table) showed no evidence of electrical instability.    Impression: The above electrodiagnostic study is ABNORMAL and reveals evidence of a severe left median nerve entrapment at the wrist (carpal tunnel syndrome) affecting sensory and motor components.   There is no significant electrodiagnostic evidence of any other focal nerve entrapment, brachial plexopathy or cervical radiculopathy.   Recommendations: 1.  Follow-up with referring physician.  Suggest surgical consultation for decompression. 2.  Continue current management of symptoms.  ___________________________ Prentice Masters FAAPMR Board Certified, American Board of Physical Medicine and Rehabilitation    Nerve Conduction Studies Anti Sensory Summary Table   Stim Site NR Peak (ms) Norm Peak (ms) P-T Amp (V) Norm P-T Amp Site1 Site2 Delta-P (ms) Dist (cm) Vel (m/s) Norm Vel (m/s)  Left Median Acr Palm Anti Sensory (2nd Digit)  31.5C  Wrist *NR  <3.6  >10 Wrist Palm  0.0    Palm *NR  <2.0          Left Radial Anti Sensory (Base 1st Digit)  31.7C  Wrist    2.0 <3.1 27.1  Wrist Base 1st Digit 2.0 0.0    Left Ulnar Anti Sensory (5th Digit)  32C  Wrist    3.2 <3.7 15.8 >15.0 Wrist 5th Digit 3.2 14.0 44 >38   Motor Summary Table EMG   Side Muscle Nerve Root Ins Act Fibs Psw Amp Dur Poly Recrt Int Bruna Comment  Left Abd Poll Brev Median C8-T1 *Incr *2+ *2+ Nml Nml 0 Nml Nml   Left 1stDorInt Ulnar C8-T1 Nml Nml  Nml Nml Nml 0 Nml Nml   Left PronatorTeres Median C6-7 Nml Nml Nml Nml Nml 0 Nml Nml   Left Biceps Musculocut C5-6 Nml Nml Nml Nml Nml 0 Nml Nml   Left Deltoid Axillary C5-6 Nml Nml Nml Nml Nml 0 Nml Nml     Nerve Conduction Studies Anti Sensory Left/Right Comparison   Stim Site L Lat (ms) R Lat (ms) L-R Lat (ms) L Amp (V) R Amp (V) L-R Amp (%) Site1 Site2 L Vel (m/s) R Vel (m/s) L-R Vel (m/s)  Median Acr Palm Anti Sensory (2nd Digit)  31.5C  Wrist       Wrist Palm     Palm             Radial Anti Sensory (Base 1st Digit)  31.7C  Wrist 2.0   27.1   Wrist Base 1st Digit     Ulnar Anti Sensory (5th Digit)  32C  Wrist 3.2   15.8   Wrist 5th Digit 44     Motor Left/Right Comparison   Stim Site L Lat (ms) R Lat (ms) L-R Lat (ms) L Amp (mV) R Amp (mV) L-R Amp (%) Site1 Site2 L Vel (m/s) R Vel (m/s) L-R Vel (m/s)  Median Motor (Abd Poll Brev)  32C  Wrist *7.0   *1.0   Elbow Wrist 50    Elbow 11.2   0.5   Axilla Elbow     Axilla 7.1  0.8         Ulnar Motor (Abd Dig Min)  32.1C  Wrist 2.7   8.2   B Elbow Wrist 66    B Elbow 5.6   7.3   A Elbow B Elbow 67    A Elbow 7.1   6.8            Waveforms:

## 2024-07-24 ENCOUNTER — Encounter: Payer: Self-pay | Admitting: Physical Medicine and Rehabilitation

## 2024-07-24 NOTE — Progress Notes (Signed)
 [name] - 58 y.o. female MRN [id number]  Date of birth: [birth year]  Office Visit Note: Visit Date: 07/15/2024 PCP: Antonetta Rollene BRAVO, MD Referred by: Vernetta Lonni GRADE*  Subjective: Chief Complaint  Patient presents with   Left Hand - Pain, Numbness   HPI: [name] is a 58 y.o. female who comes in today at the request of Dr. Lonni Vernetta for evaluation and management of chronic, worsening and severe pain, numbness and tingling in the Left upper extremities.  Patient is Right hand dominant.  She reports that couple of months of worsening symptoms to the point where it is becoming prominent during the day.  She reports really chronically over the years she has had nocturnal complaints where she would wake up with a numb hand.  She has a positive flick sign.  She now has a lot of numbness and tingling in the hand and left upper extremity.  No real frank radicular symptoms down the arm.  She changed her job several months ago and is more repetitive and this seems to have really aggravated things.  She really cannot get to sleep.  She has no history of polyneuropathy.  No history of right sided symptoms.  The pain does seem to radiate up the arm and not down the arm.  No history of cervical surgery.  She has had a history of lumbar surgery through Dr. Anita.  She is considered prediabetic and her hemoglobin A1c's are borderline.   I spent more than 30 minutes speaking face-to-face with the patient with 50% of the time in counseling and discussing coordination of care.      Review of Systems  Musculoskeletal:  Positive for back pain and joint pain.  Neurological:  Positive for tingling and focal weakness.  All other systems reviewed and are negative.  Otherwise per HPI.  Assessment & Plan: Visit Diagnoses:    ICD-10-CM   1. Paresthesia of skin  R20.2 NCV with EMG (electromyography)    2. Pain in left hand  M79.642     3. Left hand weakness  R29.898         Plan: Impression: Clinically she has a combination of osteoarthritic pain but clear and likely severe carpal tunnel syndrome.  Electrodiagnostic study performed today.  The above electrodiagnostic study is ABNORMAL and reveals evidence of a severe left median nerve entrapment at the wrist (carpal tunnel syndrome) affecting sensory and motor components.   There is no significant electrodiagnostic evidence of any other focal nerve entrapment, brachial plexopathy or cervical radiculopathy.   Recommendations: 1.  Follow-up with referring physician.  Suggest surgical consultation for decompression. 2.  Continue current management of symptoms.  Meds & Orders: No orders of the defined types were placed in this encounter.   Orders Placed This Encounter  Procedures   NCV with EMG (electromyography)    Follow-up: Return for Lonni Vernetta, MD.   Procedures: No procedures performed  EMG & NCV Findings: Evaluation of the left median motor nerve showed prolonged distal onset latency (7.0 ms) and reduced amplitude (1.0 mV).  The left median (across palm) sensory nerve showed no response (Wrist) and no response (Palm).  All remaining nerves (as indicated in the following tables) were within normal limits.    Needle evaluation of the left abductor pollicis brevis muscle showed increased insertional activity and moderately increased spontaneous activity.  All remaining muscles (as indicated in the following table) showed no evidence of electrical instability.    Impression: The  above electrodiagnostic study is ABNORMAL and reveals evidence of a severe left median nerve entrapment at the wrist (carpal tunnel syndrome) affecting sensory and motor components.   There is no significant electrodiagnostic evidence of any other focal nerve entrapment, brachial plexopathy or cervical radiculopathy.   Recommendations: 1.  Follow-up with referring physician.  Suggest surgical consultation for  decompression. 2.  Continue current management of symptoms.  ___________________________ Prentice Masters FAAPMR Board Certified, American Board of Physical Medicine and Rehabilitation    Nerve Conduction Studies Anti Sensory Summary Table   Stim Site NR Peak (ms) Norm Peak (ms) P-T Amp (V) Norm P-T Amp Site1 Site2 Delta-P (ms) Dist (cm) Vel (m/s) Norm Vel (m/s)  Left Median Acr Palm Anti Sensory (2nd Digit)  31.5C  Wrist *NR  <3.6  >10 Wrist Palm  0.0    Palm *NR  <2.0          Left Radial Anti Sensory (Base 1st Digit)  31.7C  Wrist    2.0 <3.1 27.1  Wrist Base 1st Digit 2.0 0.0    Left Ulnar Anti Sensory (5th Digit)  32C  Wrist    3.2 <3.7 15.8 >15.0 Wrist 5th Digit 3.2 14.0 44 >38   Motor Summary Table EMG   Side Muscle Nerve Root Ins Act Fibs Psw Amp Dur Poly Recrt Int Bruna Comment  Left Abd Poll Brev Median C8-T1 *Incr *2+ *2+ Nml Nml 0 Nml Nml   Left 1stDorInt Ulnar C8-T1 Nml Nml Nml Nml Nml 0 Nml Nml   Left PronatorTeres Median C6-7 Nml Nml Nml Nml Nml 0 Nml Nml   Left Biceps Musculocut C5-6 Nml Nml Nml Nml Nml 0 Nml Nml   Left Deltoid Axillary C5-6 Nml Nml Nml Nml Nml 0 Nml Nml     Nerve Conduction Studies Anti Sensory Left/Right Comparison   Stim Site L Lat (ms) R Lat (ms) L-R Lat (ms) L Amp (V) R Amp (V) L-R Amp (%) Site1 Site2 L Vel (m/s) R Vel (m/s) L-R Vel (m/s)  Median Acr Palm Anti Sensory (2nd Digit)  31.5C  Wrist       Wrist Palm     Palm             Radial Anti Sensory (Base 1st Digit)  31.7C  Wrist 2.0   27.1   Wrist Base 1st Digit     Ulnar Anti Sensory (5th Digit)  32C  Wrist 3.2   15.8   Wrist 5th Digit 44     Motor Left/Right Comparison   Stim Site L Lat (ms) R Lat (ms) L-R Lat (ms) L Amp (mV) R Amp (mV) L-R Amp (%) Site1 Site2 L Vel (m/s) R Vel (m/s) L-R Vel (m/s)  Median Motor (Abd Poll Brev)  32C  Wrist *7.0   *1.0   Elbow Wrist 50    Elbow 11.2   0.5   Axilla Elbow     Axilla 7.1   0.8         Ulnar Motor (Abd Dig Min)  32.1C  Wrist  2.7   8.2   B Elbow Wrist 66    B Elbow 5.6   7.3   A Elbow B Elbow 67    A Elbow 7.1   6.8            Waveforms:            Clinical History: No specialty comments available.   She reports that she has never smoked. She has never  used smokeless tobacco.  Recent Labs    01/06/24 1636 06/24/24 1028  HGBA1C 6.0* 5.8*    Objective:  VS:  HT:    WT:   BMI:     BP:   HR: bpm  TEMP: ( )  RESP:  Physical Exam Vitals and nursing note reviewed.  Constitutional:      General: She is not in acute distress.    Appearance: Normal appearance. She is well-developed. She is obese. She is not ill-appearing.  HENT:     Head: Normocephalic and atraumatic.  Eyes:     Conjunctiva/sclera: Conjunctivae normal.     Pupils: Pupils are equal, round, and reactive to light.  Cardiovascular:     Rate and Rhythm: Normal rate.     Pulses: Normal pulses.  Pulmonary:     Effort: Pulmonary effort is normal.  Musculoskeletal:        General: No swelling, tenderness or deformity.     Right lower leg: No edema.     Left lower leg: No edema.     Comments: Inspection reveals flattening of the left APB but no atrophy of the bilateral  FDI or hand intrinsics. There is no swelling, color changes, allodynia or dystrophic changes. There is 5 out of 5 strength in the bilateral wrist extension, finger abduction and long finger flexion.  Impaired sensation to light touch in the left median nerve distribution. There is a negative Tinel's test at the bilateral wrist and elbow. There is a positive Phalen's test on the left. There is a negative Hoffmann's test bilaterally.  Skin:    General: Skin is warm and dry.     Findings: No erythema or rash.  Neurological:     General: No focal deficit present.     Mental Status: She is alert and oriented to person, place, and time.     Sensory: Sensory deficit present.     Motor: Weakness present. No abnormal muscle tone.     Coordination: Coordination normal.      Gait: Gait abnormal.  Psychiatric:        Mood and Affect: Mood normal.        Behavior: Behavior normal.     Ortho Exam  Imaging: No results found.  Past Medical/Family/Surgical/Social History: Medications & Allergies reviewed per EMR, new medications updated. Patient Active Problem List   Diagnosis Date Noted   Degeneration of intervertebral disc of lumbar region with discogenic back pain and lower extremity pain 06/24/2024   Annual visit for general adult medical examination with abnormal findings 01/22/2024   Encounter for immunization 01/22/2024   Impaired fasting blood sugar 07/27/2023   Dyslipidemia 07/16/2023   Sciatica, right side 07/14/2023   Allergic dermatitis 12/29/2022   Calculus of gallbladder with acute cholecystitis 12/09/2022   Colicky RUQ abdominal pain 12/02/2022   Acute cholecystitis 12/02/2022   Encounter for annual physical exam 09/21/2022   Excess skin 09/21/2022   Fatigue due to excessive exertion 09/21/2022   Vitamin D  deficiency 07/21/2022   Lower abdominal pain 07/02/2022   Need for shingles vaccine 04/02/2022   Fibroids 07/19/2019   Menorrhagia with regular cycle 04/10/2019   Left carpal tunnel syndrome 03/18/2017   Severe persistent asthma (HCC) 04/15/2016   Abnormal thyroid  ultrasound 12/29/2014   Hyperlipidemia LDL goal <100 12/22/2014   Prediabetes 12/22/2014   Low back pain 09/28/2013   Baker's cyst 10/28/2011   Peptic ulcer 11/12/2010   Hypothyroidism 10/30/2010   SCIATICA, LEFT 01/18/2010   Eczema 12/31/2009  Morbid obesity (HCC) 11/02/2008   Essential hypertension 11/02/2008   Allergic rhinitis 11/02/2008   DEGENERATIVE JOINT DISEASE, LEFT KNEE 11/02/2008   Past Medical History:  Diagnosis Date   Asthma 2010   Eczema since childhoosd   flare since 2011   Fibroids    Hypertension    Menorrhagia    Family History  Problem Relation Age of Onset   Asthma Paternal Aunt    Asthma Paternal Uncle    Liver disease Paternal  Grandfather    Aneurysm Maternal Grandfather        brain   Heart attack Father    Past Surgical History:  Procedure Laterality Date   ANKLE SURGERY Right    BACK SURGERY     COLONOSCOPY N/A 04/27/2020   Procedure: COLONOSCOPY;  Surgeon: Shaaron Lamar HERO, MD;  Location: [facility];  Service: Endoscopy;  Laterality: N/A;  7:30   KNEE SURGERY Left    TUBAL LIGATION     Social History   Occupational History   Occupation: works with chemicals  Tobacco Use   Smoking status: Never   Smokeless tobacco: Never  Vaping Use   Vaping status: Never Used  Substance and Sexual Activity   Alcohol use: Yes    Comment: 1 glass of wine twice a week   Drug use: No   Sexual activity: Yes    Birth control/protection: Surgical    Comment: tubal

## 2024-08-05 ENCOUNTER — Other Ambulatory Visit (HOSPITAL_COMMUNITY): Payer: Self-pay

## 2024-08-06 ENCOUNTER — Other Ambulatory Visit: Payer: Self-pay | Admitting: Family Medicine

## 2024-08-06 DIAGNOSIS — E038 Other specified hypothyroidism: Secondary | ICD-10-CM

## 2024-08-15 ENCOUNTER — Encounter: Payer: Self-pay | Admitting: Radiology

## 2024-09-14 ENCOUNTER — Other Ambulatory Visit: Payer: Self-pay | Admitting: Family Medicine

## 2024-09-19 ENCOUNTER — Encounter: Payer: Self-pay | Admitting: Orthopaedic Surgery

## 2024-09-19 ENCOUNTER — Ambulatory Visit: Admitting: Orthopaedic Surgery

## 2024-09-19 DIAGNOSIS — G5602 Carpal tunnel syndrome, left upper limb: Secondary | ICD-10-CM

## 2024-09-19 NOTE — Progress Notes (Signed)
 The patient is seen in follow-up today as a relates to her severe left hand numbness, tingling and pain.  Recent nerve conduction studies have confirmed severe median nerve entrapment of the left wrist with severe carpal tunnel syndrome.  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  She has numbness and tingling of the left hand that does wake her up at night as well and it does affect her job.  On exam there is no muscle atrophy of the left hand but there is a weak grip and pinch strength and a positive Phalen's and Tinel's exam.  Nerve conduction studies are reviewed and it does show severe median nerve entrapment of the left transverse carpal ligament.  I did describe in length and detail carpal tunnel surgery and carpal tunnel syndrome.  I described what the surgery involves and discussed the risks and benefits of surgery.  She will need to be out of work the day of surgery and then for likely 4 to 6 weeks following surgery as she recovers.  All questions and concerns were answered and addressed.  Will work on getting this scheduled in the near future.

## 2024-09-21 ENCOUNTER — Telehealth: Payer: Self-pay

## 2024-09-21 NOTE — Telephone Encounter (Signed)
 I called patient to discuss scheduling [facility].  Left voice mail message for return call.

## 2024-09-27 ENCOUNTER — Telehealth: Payer: Self-pay | Admitting: Orthopaedic Surgery

## 2024-09-27 NOTE — Telephone Encounter (Signed)
 noted

## 2024-09-27 NOTE — Telephone Encounter (Signed)
 Pt  came in today to pay for her FMLA fee and is saying that the forms need to be filled out and sent today or she will be denied and that she wants them to be sent to her MY Chart as well. I called Tammy but she was at lunch.

## 2024-09-29 ENCOUNTER — Other Ambulatory Visit: Payer: Self-pay | Admitting: Orthopaedic Surgery

## 2024-09-29 DIAGNOSIS — G5602 Carpal tunnel syndrome, left upper limb: Secondary | ICD-10-CM | POA: Diagnosis not present

## 2024-09-29 MED ORDER — HYDROCODONE-ACETAMINOPHEN 5-325 MG PO TABS: 1.0000 | ORAL_TABLET | Freq: Four times a day (QID) | ORAL | 0 refills | Status: AC | PRN

## 2024-10-12 ENCOUNTER — Ambulatory Visit (INDEPENDENT_AMBULATORY_CARE_PROVIDER_SITE_OTHER): Admitting: Orthopaedic Surgery

## 2024-10-12 ENCOUNTER — Encounter: Payer: Self-pay | Admitting: Orthopaedic Surgery

## 2024-10-12 DIAGNOSIS — Z9889 Other specified postprocedural states: Secondary | ICD-10-CM

## 2024-10-12 NOTE — Progress Notes (Signed)
 The patient is here today for her first postoperative visit status post a left open carpal tunnel release.  She had previous clinical exam and EMG/nerve conduction studies showing severe carpal tunnel syndrome.  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  She is scheduled to be out until January 28.  She does not report any significant issues from surgery.  On exam her left hand incision looks good.  Sutures removed and Steri-Strips applied.  She can start getting this wet tomorrow.  She can use her hand as comfort allows but no heavy gripping.  Will see her back around January 26 to see if she is ready for return to work without restrictions on January 28.

## 2024-10-15 ENCOUNTER — Other Ambulatory Visit: Payer: Self-pay | Admitting: Family Medicine

## 2024-10-15 DIAGNOSIS — J309 Allergic rhinitis, unspecified: Secondary | ICD-10-CM

## 2024-10-21 ENCOUNTER — Other Ambulatory Visit: Payer: Self-pay | Admitting: Family Medicine

## 2024-11-07 ENCOUNTER — Encounter: Admitting: Orthopaedic Surgery

## 2024-11-09 ENCOUNTER — Ambulatory Visit: Payer: Self-pay | Admitting: Physician Assistant

## 2024-11-09 ENCOUNTER — Encounter: Payer: Self-pay | Admitting: Physician Assistant

## 2024-11-09 DIAGNOSIS — Z9889 Other specified postprocedural states: Secondary | ICD-10-CM

## 2024-11-09 NOTE — Progress Notes (Signed)
 HPI: Mrs. Durney returns today follow-up status post left carpal tunnel surgery 09/29/2024.  She still has some tenderness about the hand and occasional numbness but states that numbness is overall slowly resolving.  She remains out of work at this point in time.  Left hand: Surgical incisions well-healed.  Full motor sensation subjectively intact throughout the hand.  Impression: Status post left carpal tunnel release  Plan: Will keep her out of work until 11/21/2024.  She will return to work full duties on 11/21/2024.  She will continue to work on scar tissue mobilization and desensitizing the hand.  Follow-up with us  as needed if pain persist or becomes worse.  Questions were encouraged and answered.

## 2024-11-09 NOTE — Patient Instructions (Addendum)
 SABRA

## 2024-11-13 ENCOUNTER — Other Ambulatory Visit: Payer: Self-pay | Admitting: Family Medicine

## 2025-01-13 ENCOUNTER — Encounter: Admitting: Family Medicine

## 2025-01-20 ENCOUNTER — Encounter: Admitting: Family Medicine

## 2025-01-27 ENCOUNTER — Encounter: Admitting: Family Medicine
